# Patient Record
Sex: Male | Born: 1975
Health system: Southern US, Community
[De-identification: ages and names within clinical notes are randomized; demographics above are authoritative.]

## PROBLEM LIST (undated history)

## (undated) DIAGNOSIS — J45909 Unspecified asthma, uncomplicated: Secondary | ICD-10-CM

## (undated) DIAGNOSIS — I10 Essential (primary) hypertension: Secondary | ICD-10-CM

## (undated) DIAGNOSIS — I509 Heart failure, unspecified: Secondary | ICD-10-CM

## (undated) DIAGNOSIS — H3500 Unspecified background retinopathy: Secondary | ICD-10-CM

## (undated) DIAGNOSIS — F32A Depression, unspecified: Secondary | ICD-10-CM

## (undated) DIAGNOSIS — F319 Bipolar disorder, unspecified: Secondary | ICD-10-CM

## (undated) DIAGNOSIS — I219 Acute myocardial infarction, unspecified: Secondary | ICD-10-CM

## (undated) DIAGNOSIS — E785 Hyperlipidemia, unspecified: Secondary | ICD-10-CM

## (undated) DIAGNOSIS — B019 Varicella without complication: Secondary | ICD-10-CM

## (undated) DIAGNOSIS — F329 Major depressive disorder, single episode, unspecified: Secondary | ICD-10-CM

## (undated) DIAGNOSIS — D689 Coagulation defect, unspecified: Secondary | ICD-10-CM

## (undated) DIAGNOSIS — R011 Cardiac murmur, unspecified: Secondary | ICD-10-CM

## (undated) DIAGNOSIS — I639 Cerebral infarction, unspecified: Secondary | ICD-10-CM

## (undated) DIAGNOSIS — E119 Type 2 diabetes mellitus without complications: Secondary | ICD-10-CM

## (undated) DIAGNOSIS — R0602 Shortness of breath: Secondary | ICD-10-CM

## (undated) HISTORY — DX: Type 2 diabetes mellitus without complications: E11.9

## (undated) HISTORY — DX: Depression, unspecified: F32.A

## (undated) HISTORY — DX: Unspecified background retinopathy: H35.00

## (undated) HISTORY — DX: Unspecified asthma, uncomplicated: J45.909

## (undated) HISTORY — DX: Cerebral infarction, unspecified: I63.9

## (undated) HISTORY — DX: Hyperlipidemia, unspecified: E78.5

## (undated) HISTORY — DX: Coagulation defect, unspecified: D68.9

## (undated) HISTORY — DX: Cardiac murmur, unspecified: R01.1

## (undated) HISTORY — DX: Major depressive disorder, single episode, unspecified: F32.9

## (undated) HISTORY — DX: Essential (primary) hypertension: I10

## (undated) HISTORY — DX: Bipolar disorder, unspecified: F31.9

## (undated) HISTORY — DX: Acute myocardial infarction, unspecified: I21.9

## (undated) HISTORY — DX: Shortness of breath: R06.02

## (undated) HISTORY — DX: Heart failure, unspecified: I50.9

## (undated) HISTORY — PX: WISDOM TOOTH EXTRACTION: SHX21

## (undated) HISTORY — DX: Varicella without complication: B01.9

---

## 2001-06-01 DIAGNOSIS — E119 Type 2 diabetes mellitus without complications: Secondary | ICD-10-CM

## 2001-06-01 HISTORY — DX: Type 2 diabetes mellitus without complications: E11.9

## 2015-06-02 DIAGNOSIS — I219 Acute myocardial infarction, unspecified: Secondary | ICD-10-CM

## 2015-06-02 HISTORY — DX: Acute myocardial infarction, unspecified: I21.9

## 2015-06-02 HISTORY — PX: CORONARY ANGIOPLASTY WITH STENT PLACEMENT: SHX49

## 2016-01-31 DIAGNOSIS — H3500 Unspecified background retinopathy: Secondary | ICD-10-CM

## 2016-01-31 HISTORY — DX: Unspecified background retinopathy: H35.00

## 2016-06-01 DIAGNOSIS — I2699 Other pulmonary embolism without acute cor pulmonale: Secondary | ICD-10-CM

## 2016-06-01 HISTORY — DX: Other pulmonary embolism without acute cor pulmonale: I26.99

## 2016-08-13 DIAGNOSIS — E109 Type 1 diabetes mellitus without complications: Secondary | ICD-10-CM | POA: Diagnosis not present

## 2016-08-17 ENCOUNTER — Other Ambulatory Visit: Payer: Self-pay

## 2016-08-17 VITALS — BP 110/64 | HR 74 | Ht 72.0 in | Wt 253.3 lb

## 2016-08-17 DIAGNOSIS — R0602 Shortness of breath: Secondary | ICD-10-CM

## 2016-08-17 HISTORY — DX: Shortness of breath: R06.02

## 2016-08-17 NOTE — Patient Outreach (Signed)
[facility]) Care Management  Welcome  08/17/2016   Shante Maysonet 1975-08-25 [id number]  Subjective: I met with Jose Merritt today in the [facility] diabetes program.  We will support Jose Merritt, provide education, and encourage ongoing care with his team of providers.  Jose Merritt reports that his CGMS is currently not working so he has not been monitoring blood sugars for about 3 weeks.  The CGMS is under warranty and he hopes to have it back in a couple of weeks.  He reports counting carbs at meals and taking his insulin to cover it but not giving any correction insulin.  Jose Merritt was wearing his insulin pump today when he came to visit.   He did not have his meter because he has not been checking.   He is taking all his oral medications as ordered.  He ran out of [place] and his psychiatrist will not reorder it until he visits with her.     Jose Merritt denies any pain.  He reports increased SOB, but no leg swelling .  He does not weigh daily.  He was at the cardiologist's in January- his O2 sats were normal at that time.   He complains of depression (several days a week).  He does not exercise.  He and his family eat out 6 times a week. He eats very little during the day then over eats at night.  Today he had only eaten a Poptart before our 1 pm meeting. He is the heaviest weight he's ever been in his life.   Objective:  Vitals:   08/17/16 1301  BP: 110/64  Pulse: 74  SpO2: 97%  Weight: 253 lb 4.8 oz (114.9 kg)  Height: 1.829 m (6')     Encounter Medications:  Outpatient Encounter Prescriptions as of 08/17/2016  Medication Sig  . aspirin 81 MG chewable tablet Chew by mouth daily.  Marland Kitchen atorvastatin (LIPITOR) 80 MG tablet Take 80 mg by mouth daily. qam  . clopidogrel (PLAVIX) 75 MG tablet Take 75 mg by mouth daily. qam  . DULoxetine (CYMBALTA) 60 MG capsule Take 60 mg by mouth daily. qam  . insulin lispro (HUMALOG) 100 UNIT/ML injection Inject into the skin  continuous. Medtronic 670G basal rate 3.6 units/hour, I:C ratio 3.5gm/1 unit, goal 100-120 correction factor 1 units=17 points.  Also uses CGMS.  Continuous pump user since October 2017   . lisinopril (PRINIVIL,ZESTRIL) 5 MG tablet Take 5 mg by mouth daily. qam  . Lurasidone HCl (LATUDA PO) Take by mouth daily. Patient does not have a current order for this medication.   . metoprolol succinate (TOPROL-XL) 100 MG 24 hr tablet Take 100 mg by mouth daily. Take with or immediately following a meal. qam  . spironolactone (ALDACTONE) 25 MG tablet Take 12.5 mg by mouth daily. qam   No facility-administered encounter medications on file as of 08/17/2016.     Functional Status:  In your present state of health, do you have any difficulty performing the following activities: 08/17/2016  Hearing? N  Vision? N  Difficulty concentrating or making decisions? N  Walking or climbing stairs? N  Dressing or bathing? N  Doing errands, shopping? N    Fall/Depression Screening: PHQ 2/9 Scores 08/17/2016  PHQ - 2 Score 2  PHQ- 9 Score 5    Assessment: Jose Merritt is poorly engaged in his care.  He is also very negative about the possibility of being able to manage his health over a long period of  time- he reports he does well for a while, and although he feels better when he is managing his disease, he never seems to sustain it.   Plan:  [facility] CM Care Plan Problem One     Most Recent Value  Care Plan Problem One  Elevated A1C   Role Documenting the Problem One  Care Management Coordinator  Care Plan for Problem One  Active  [facility] Long Term Goal (31-90 days)  Patient will lower A1C to lower than 9.3% when it is checked in 3 months  [facility] Long Term Goal Start Date  08/17/16  Interventions for Problem One Long Term Goal  1. get f/u appointment with psychiatrist to get Latuda  2. Exercise 3x/week for 15 minutes  3.  Calculate carbs, fat and sodium at one meal this week- send it to me   4. follow up on CGMS but check blood  sugars before meals while you do not have the CGMS    [facility] CM Care Plan Problem Two     Most Recent Value  Care Plan Problem Two  Patient does not carry low blood sugar treatment with him - stating- I never have low blood sugars  Role Documenting the Problem Two  Care Management [place] for Problem Two  Active  [facility] Long Term Goal (31-90) days  Patient is not prepared to treat a low blood sugar (does not carry supplies with him)  [facility] Long Term Goal Start Date  08/17/16     I will follow up with Jose Merritt on April 9th.  I have encouraged him to see you before then and to check his blood sugars before each meal.  He needs to schedule a follow up appointment for a dilated eye (in September) as he will be using a new doctor.    He needs to get to the psychiatrist to get his Latuda restarted.    Gentry Fitz, RN, BA, [place], [place] Direct Dial:  [phone]  Fax:  [phone] E-mail: [email or web address] [address], [place],   [address]

## 2016-08-25 ENCOUNTER — Other Ambulatory Visit: Payer: Self-pay

## 2016-08-25 NOTE — Patient Outreach (Deleted)
[facility]) Care Management  08/25/2016  Danuel Burkes 08/12/1975 [id number]   E-mail sent to patient's email; [email or web address] checking on patient's progress.  Have not received 1 meal assessment of carbohydrates, fat, sodium as requested at visit last week.   [name], RN, BA, MHA, CDE [facility] Diabetes Coordinator- [name] Dial:  [phone]  Fax:  [phone] E-mail: [name] [address], [place], [place]  [address]

## 2016-08-25 NOTE — Patient Outreach (Signed)
[facility]) Care Management  08/25/2016  Tiam Neller 1975-10-17 [id number]   E-mail sent to patient Wendall Stade requesting 1 meal assessment of sodium, carbohydrates and fats from last week- have not received it as requested last week at our initial appointment.   [name], RN, BA, MHA, CDE [facility] Diabetes Coordinator- [name] Dial:  [phone]  Fax:  [phone] E-mail: [name] [address], [place], [place]  [address]

## 2016-08-26 ENCOUNTER — Other Ambulatory Visit: Payer: Self-pay

## 2016-08-26 NOTE — Patient Outreach (Signed)
[facility]) Care Management  08/26/2016  Jose Merritt [birth year] [id number]   Reached out to patient today as a follow up to last week's initial appointment.  Interested in finding out; 1. Did he call family doctor to get [name] ordered? 2. Has he been checking blood sugars- what are they? 3. Did his CGMS arrive (it was being repaired under warranty)? 4. Has he had SOB or leg swelling? 5. Has he been able to exercise 3x/week for 15 minutes? 6. Is he prepared for a low blood sugar episode by keeping glucose and a snack with him at all times?   [name], RN, BA, MHA, CDE [facility] Diabetes Coordinator- [name] Dial:  [phone]  Fax:  [phone] E-mail: [name] [address] [place], [place]  [address]

## 2016-09-07 ENCOUNTER — Other Ambulatory Visit: Payer: Self-pay

## 2016-09-07 DIAGNOSIS — Z794 Long term (current) use of insulin: Principal | ICD-10-CM

## 2016-09-07 DIAGNOSIS — E1165 Type 2 diabetes mellitus with hyperglycemia: Secondary | ICD-10-CM

## 2016-09-07 DIAGNOSIS — IMO0002 Reserved for concepts with insufficient information to code with codable children: Secondary | ICD-10-CM

## 2016-09-07 DIAGNOSIS — E118 Type 2 diabetes mellitus with unspecified complications: Principal | ICD-10-CM

## 2016-09-07 NOTE — Patient Outreach (Signed)
[facility]) Care Management  09/07/2016  Jose Merritt 07/22/1975 [id number]  Received an e-mail this morning from [facility], stating;   " woke up today with severe chest pains. I'm not taking my daughter to school and I'm staying here. Can we please reschedule? I apologize for this late notice but I really don't feel well. Please let me know ASAP. " [name]  I responded by e-mail stating he needed to go to the emergency room for chest pain asap.    His response was; Thanks for understanding. The pain is much better now. I'm just trying to stay calm. I get scared of another heart attack when this happens. Please let me know when I can come back to see you.   [name], RN, BA, MHA, CDE [facility] Diabetes Coordinator- [name] Direct Dial:  [phone]  Fax:  [phone] E-mail: [name] [address], [place], [place]  [address]

## 2016-09-07 NOTE — Patient Outreach (Signed)
[facility]) Care Management  09/07/2016  Carlie Franchina [birth year] [id number]   Spoke to patient by phone.  He stated "it (chest pain) is nearly gone".  I asked him if he had Nitroglycerine and if he had taken any (yes he has some/no he didn't take any).  Tells me the chest pain started after his wife left for work.  He "thought it might be indigestion".  I have instructed him that he needs to presume it is always cardiac- with his medical history and elevated A1C.    His daughter is home with him at this time- I have instructed to always go to the hospital with chest pain, that he needs to call his wife (that she could come home).  When I enquired about him getting his medication from his psychiatrist (he needed to make an appointment with her in order to get a refill).  He tells me he did not make an appointment with her and that he would call and do it tomorrow.  When I asked why he'd wait until tomorrow and not do it today, he said "I could".   I will follow up later this week.   [name], RN, BA, MHA, CDE [facility] Diabetes Coordinator- [name] Dial:  [phone]  Fax:  [phone] E-mail: [name] [address] [place], [place]  [address]

## 2016-09-07 NOTE — Patient Outreach (Signed)
[facility]) Care Management  08/27/16 Jose Merritt [birth year] [id number]   Abem emailed me on 3/39/18;   "I have completed my POC paperwork and the [facility] education. Last week I exercised 4 times. 3 15 min like instructed and quite a long walk this past Monday. My wife got my glucose tablets for the vehicles. I am going to attempt to make the required appointment while I'm here in [place].  '  2nd e-mail;  my eye appointment is May24 1:40pm   [name], RN, BA, MHA, CDE [facility] Diabetes Coordinator- [name] Direct Dial:  [phone]  Fax:  [phone] E-mail: [name] [address], [place], [place]  [address]

## 2016-09-08 ENCOUNTER — Other Ambulatory Visit: Payer: Self-pay

## 2016-09-08 DIAGNOSIS — E118 Type 2 diabetes mellitus with unspecified complications: Principal | ICD-10-CM

## 2016-09-08 DIAGNOSIS — IMO0002 Reserved for concepts with insufficient information to code with codable children: Secondary | ICD-10-CM

## 2016-09-08 DIAGNOSIS — E1165 Type 2 diabetes mellitus with hyperglycemia: Secondary | ICD-10-CM

## 2016-09-08 DIAGNOSIS — Z794 Long term (current) use of insulin: Principal | ICD-10-CM

## 2016-09-08 NOTE — Patient Outreach (Signed)
[facility]) Care Management  09/08/2016  Paige Lacour 1975/07/30 [id number]   Thunder has notified me that he has scheduled an appointment  On April 13th 10:30AM at the [facility] with [facility].   [name], RN, BA, MHA, CDE [facility] Diabetes Coordinator- [name] Dial:  [phone]  Fax:  [phone] E-mail: [name] [address], [place], [place]  [address]

## 2016-09-08 NOTE — Patient Outreach (Signed)
[facility]) Care Management  09/08/2016  Jose Merritt 07/29/1975 [id number]   Spoke to patient by phone today.  He is not having chest pain today.  He had no chest pain last night and went for a walk with his child and wife. He has been walking 4x/week.  He told his wife about the chest pain episode and his wife agreed with me that he should take his Nitroglycerine and go the emergency room.   Patient complained that ED visits were expensive and I told him that although they are expensive, having a stroke or being debilitated were going to "cost him" as well. We agreed that in future, he would follow his MD instructions to 1. Take nitro  2. call EMS if pain is not relieved in a few minutes.  I will follow up with him next week.   [name], RN, BA, MHA, CDE [facility] Diabetes Coordinator- [name] Dial:  [phone]  Fax:  [phone] E-mail: [name] [address] [place], [place]  [address]

## 2016-09-16 ENCOUNTER — Other Ambulatory Visit: Payer: Self-pay

## 2016-09-16 VITALS — BP 96/74 | HR 86 | Ht 72.0 in | Wt 293.3 lb

## 2016-09-16 DIAGNOSIS — E1165 Type 2 diabetes mellitus with hyperglycemia: Secondary | ICD-10-CM

## 2016-09-16 DIAGNOSIS — E118 Type 2 diabetes mellitus with unspecified complications: Principal | ICD-10-CM

## 2016-09-16 DIAGNOSIS — Z794 Long term (current) use of insulin: Principal | ICD-10-CM

## 2016-09-16 DIAGNOSIS — IMO0002 Reserved for concepts with insufficient information to code with codable children: Secondary | ICD-10-CM

## 2016-09-16 LAB — POCT GLYCOSYLATED HEMOGLOBIN (HGB A1C): HEMOGLOBIN A1C: 11.2

## 2016-09-16 NOTE — Patient Outreach (Signed)
[facility]) Care Management  [facility] Care Manager  09/16/2016   Jose Merritt 03/11/1976 [id number]  Subjective: Keitaro was here for his follow up [name] diabetes visit.  He has done [employer] well completing some of the tasks we addressed at our first visit; exercising 3x/week for 15 minutes, scheduling an endocrinology visit (July), scheduled a dental visit and a psychiatry visit. He is keeping glucose tablets in his car.    He experienced chest pain a week ago and tells me he has it about once every 2 weeks- but he is not using Nitro glycerine as instructed.  Complains of increased WOB  And neuropathy -pain 5/10.  He is NOT checking blood sugars regularly. He has NOT gotten his Continuous Glucose Monitor functioning properly.   He still does not have his Latuda but has scheduled to see MD on 09/30/16. He is taking his other oral medications but does not always bolus at meals.   Objective:  Vitals:   09/16/16 1025  BP: 96/74  Pulse: 86  SpO2: 97%  Weight: 293 lb 4.8 oz (133 kg)  Height: 1.829 m (6')   POCT A1C 11.2%  Encounter Medications:  Outpatient Encounter Prescriptions as of 09/16/2016  Medication Sig  . aspirin 81 MG chewable tablet Chew by mouth daily.  Marland Kitchen atorvastatin (LIPITOR) 80 MG tablet Take 80 mg by mouth daily. qam  . clopidogrel (PLAVIX) 75 MG tablet Take 75 mg by mouth daily. qam  . DULoxetine (CYMBALTA) 60 MG capsule Take 60 mg by mouth daily. qam  . insulin lispro (HUMALOG) 100 UNIT/ML injection Inject into the skin continuous. Medtronic 670G basal rate 3.6 units/hour, I:C ratio 3.5gm/1 unit, goal 100-120 correction factor 1 units=17 points.  Also uses CGMS.  Continuous pump user since October 2017   . lisinopril (PRINIVIL,ZESTRIL) 5 MG tablet Take 5 mg by mouth daily. qam  . metoprolol succinate (TOPROL-XL) 100 MG 24 hr tablet Take 100 mg by mouth daily. Take with or immediately following a meal. qam  . nitroGLYCERIN (NITROSTAT)  0.3 MG SL tablet Place 0.3 mg under the tongue every 5 (five) minutes as needed.  Marland Kitchen spironolactone (ALDACTONE) 25 MG tablet Take 12.5 mg by mouth daily. qam  . Lurasidone HCl (LATUDA PO) Take by mouth daily. Patient does not have a current order for this medication.    No facility-administered encounter medications on file as of 09/16/2016.     Functional Status:  In your present state of health, do you have any difficulty performing the following activities: 08/17/2016  Hearing? N  Vision? N  Difficulty concentrating or making decisions? N  Walking or climbing stairs? N  Dressing or bathing? N  Doing errands, shopping? N    Fall/Depression Screening: PHQ 2/9 Scores 08/17/2016  PHQ - 2 Score 2  PHQ- 9 Score 5    Assessment: Jovante is trying to improve his engagement in his care.  Since our last visit, he has "made better choices" with eating and has begun exercising.  He has scheduled all his visits but continues to be slack in checking blood sugars.  He and his wife have chosen to begin a weight loss program. We discussed weight watchers, tracking apps for food such as Lose It, the neccessity to eat 3 meals per day, the suggestion to fit in more low carbohydrate vegetables and the importance of looking a food calories/sodium and carbohydrates before he goes out to eat.  I have asked him to;  -Check blood sugars  a minimum of once a day-write them down- bring them with him to next visit.  -Execise 3x/week for 15 minutes- keep his glucose tablets with him when he walks -Call the 1-800 for his Continuous glucose monitor -Use Nitro as needed -Be "present" when he eats his 3 meals per day  I gave him a new lancing device since his was missing a cap.   Follow up in 3 weeks   [name], RN, [place], [place], CDE [facility] Diabetes Coordinator- [name] Dial:  [phone]  Fax:  [phone] E-mail: [name] [address],  [place], [place]  [address]

## 2016-09-22 ENCOUNTER — Other Ambulatory Visit: Payer: Self-pay

## 2016-09-22 ENCOUNTER — Ambulatory Visit: Payer: Self-pay

## 2016-09-22 NOTE — Patient Outreach (Signed)
[facility]) Care Management  09/22/2016  Cornellius Limpert 05-14-1976 [id number]   Left message with Markhi as a follow up to our visit last week.  I had asked him to;   -Check blood sugars a minimum of once a day-write them down- bring them with him to next visit.  -Execise 3x/week for 15 minutes- keep his glucose tablets with him when he walks -Call the 1-800 for his Continuous glucose monitor -Use Nitro as needed -Be "present" when he eats his 3 meals per day  Asked him to call and follow up with me.   [name], RN, BA, MHA, CDE [facility] Diabetes Coordinator- [name] Dial:  [phone]  Fax:  [phone] E-mail: [name] [address], [place], [place]  [address]

## 2016-09-23 DIAGNOSIS — E109 Type 1 diabetes mellitus without complications: Secondary | ICD-10-CM | POA: Diagnosis not present

## 2016-09-29 ENCOUNTER — Encounter: Payer: Self-pay | Admitting: Family

## 2016-09-29 ENCOUNTER — Ambulatory Visit (INDEPENDENT_AMBULATORY_CARE_PROVIDER_SITE_OTHER): Payer: 59 | Admitting: Family

## 2016-09-29 VITALS — BP 98/78 | HR 88 | Temp 97.9°F | Ht 72.0 in | Wt 250.0 lb

## 2016-09-29 DIAGNOSIS — I252 Old myocardial infarction: Secondary | ICD-10-CM

## 2016-09-29 DIAGNOSIS — Z8673 Personal history of transient ischemic attack (TIA), and cerebral infarction without residual deficits: Secondary | ICD-10-CM

## 2016-09-29 DIAGNOSIS — F3170 Bipolar disorder, currently in remission, most recent episode unspecified: Secondary | ICD-10-CM | POA: Diagnosis not present

## 2016-09-29 DIAGNOSIS — I10 Essential (primary) hypertension: Secondary | ICD-10-CM

## 2016-09-29 DIAGNOSIS — J0100 Acute maxillary sinusitis, unspecified: Secondary | ICD-10-CM

## 2016-09-29 DIAGNOSIS — E785 Hyperlipidemia, unspecified: Secondary | ICD-10-CM | POA: Diagnosis not present

## 2016-09-29 DIAGNOSIS — E119 Type 2 diabetes mellitus without complications: Secondary | ICD-10-CM | POA: Diagnosis not present

## 2016-09-29 MED ORDER — AMOXICILLIN-POT CLAVULANATE ER 1000-62.5 MG PO TB12: 2.0000 | ORAL_TABLET | Freq: Two times a day (BID) | ORAL | 0 refills | Status: DC

## 2016-09-29 NOTE — Progress Notes (Signed)
Subjective:    Patient ID: Jose Merritt, male    DOB: 03/16/1976, 41 y.o.   MRN: [id number]  CC: Jose Merritt is a 41 y.o. male who presents today for an acute visit.    HPI: CC: infection on nose and eyes x 2 weeks, waxing and wanes, better today. 'if press on nose can feel tenderness.' Sore to the touch. Initally on left side of nose. Now seems more tender right side of nose.  No teeth pain or trouble eating. No sinus drainage/discharge, cough. Tried tyleonol with some relief.     DM II- On insulin pump. Would like endocrinologist in [place]  Bipolar- stable on latuda. Follows with Dr. Arneta Cliche in [place] for psychiatry  New patient - from [facility]; our office is closer . Awaiting records.   Non smoker    HISTORY:  Past Medical History:  Diagnosis Date  . Asthma   . Chicken pox   . Depression   . Diabetes mellitus without complication (HCC) 2003   Type 2  . Hyperlipidemia   . Hypertension   . Myocardial infarction (HCC) 2017  . Retinopathy of both eyes 01/2016  . SOB (shortness of breath) 08/17/2016  . Stroke [facility])    Past Surgical History:  Procedure Laterality Date  . CORONARY ANGIOPLASTY WITH STENT PLACEMENT Left 06/2015   LAD  . WISDOM TOOTH EXTRACTION     Family History  Problem Relation Age of Onset  . Hypertension Mother   . Arthritis Mother   . Heart disease Father   . Diabetes Father   . Hypertension Father   . Hyperlipidemia Father   . Alcohol abuse Father   . Hypertension Brother   . Hyperlipidemia Brother   . Heart disease Maternal Uncle   . Heart disease Paternal Aunt   . Heart disease Maternal Grandfather   . Hyperlipidemia Maternal Grandfather   . Diabetes Maternal Grandfather   . Kidney disease Maternal Grandfather   . Heart disease Paternal Grandmother   . Kidney disease Paternal Grandmother   . Hypertension Maternal Grandmother   . Kidney disease Maternal Grandmother   . Arthritis Maternal Grandmother   . Kidney  disease Paternal Grandfather     Allergies: Other; Sulfa antibiotics; and Contrast media [iodinated diagnostic agents] Current Outpatient Prescriptions on File Prior to Visit  Medication Sig Dispense Refill  . aspirin 81 MG chewable tablet Chew by mouth daily.    Marland Kitchen atorvastatin (LIPITOR) 80 MG tablet Take 80 mg by mouth daily. qam    . clopidogrel (PLAVIX) 75 MG tablet Take 75 mg by mouth daily. qam    . DULoxetine (CYMBALTA) 60 MG capsule Take 60 mg by mouth daily. qam    . insulin lispro (HUMALOG) 100 UNIT/ML injection Inject into the skin continuous. Medtronic 670G basal rate 3.6 units/hour, I:C ratio 3.5gm/1 unit, goal 100-120 correction factor 1 units=17 points.  Also uses CGMS.  Continuous pump user since October 2017     . lisinopril (PRINIVIL,ZESTRIL) 5 MG tablet Take 5 mg by mouth daily. qam    . Lurasidone HCl (LATUDA PO) Take by mouth daily. Patient does not have a current order for this medication.     . metoprolol succinate (TOPROL-XL) 100 MG 24 hr tablet Take 100 mg by mouth daily. Take with or immediately following a meal. qam    . nitroGLYCERIN (NITROSTAT) 0.3 MG SL tablet Place 0.3 mg under the tongue every 5 (five) minutes as needed.    Marland Kitchen spironolactone (ALDACTONE) 25  MG tablet Take 12.5 mg by mouth daily. qam     No current facility-administered medications on file prior to visit.     Social History  Substance Use Topics  . Smoking status: Never Smoker  . Smokeless tobacco: Never Used  . Alcohol use Yes     Comment: rare- 2x/year    Review of Systems  Constitutional: Negative for chills and fever.  HENT: Positive for sinus pressure. Negative for congestion and sore throat.   Respiratory: Negative for cough, shortness of breath and wheezing.   Cardiovascular: Negative for chest pain and palpitations.  Gastrointestinal: Negative for nausea and vomiting.      Objective:    BP 98/78   Pulse 88   Temp 97.9 F (36.6 C) (Oral)   Ht 6' (1.829 m)   Wt 250 lb  (113.4 kg)   SpO2 97%   BMI 33.91 kg/m    Physical Exam  Constitutional: Vital signs are normal. He appears well-developed and well-nourished.  HENT:  Head: Normocephalic and atraumatic.  Right Ear: Hearing, tympanic membrane, external ear and ear canal normal. No drainage, swelling or tenderness. Tympanic membrane is not injected, not erythematous and not bulging. No middle ear effusion. No decreased hearing is noted.  Left Ear: Hearing, tympanic membrane, external ear and ear canal normal. No drainage, swelling or tenderness. Tympanic membrane is not injected, not erythematous and not bulging.  No middle ear effusion. No decreased hearing is noted.  Nose: Nose normal. Right sinus exhibits no maxillary sinus tenderness and no frontal sinus tenderness. Left sinus exhibits no maxillary sinus tenderness and no frontal sinus tenderness.  Mouth/Throat: Uvula is midline, oropharynx is clear and moist and mucous membranes are normal. No oral lesions. No trismus in the jaw. No dental abscesses, uvula swelling or dental caries. No oropharyngeal exudate, posterior oropharyngeal edema, posterior oropharyngeal erythema or tonsillar abscesses.  Missing teeth. No chipped teeth. No tenderness appreciated with palpation of gum, dentition.   Eyes: Conjunctivae are normal.  Cardiovascular: Regular rhythm and normal heart sounds.   Pulmonary/Chest: Effort normal and breath sounds normal. No respiratory distress. He has no wheezes. He has no rhonchi. He has no rales.  Lymphadenopathy:       Head (right side): No submental, no submandibular, no tonsillar, no preauricular, no posterior auricular and no occipital adenopathy present.       Head (left side): No submental, no submandibular, no tonsillar, no preauricular, no posterior auricular and no occipital adenopathy present.    He has no cervical adenopathy.  Neurological: He is alert.  Skin: Skin is warm and dry.  Psychiatric: He has a normal mood and affect.  His speech is normal and behavior is normal.  Vitals reviewed.      Assessment & Plan:   Problem List Items Addressed This Visit      Cardiovascular and Mediastinum   HTN (hypertension)    Well-controlled. Continue current regimen.        Respiratory   Acute non-recurrent maxillary sinusitis - Primary    Atypical presentation. Reassured by benign oral exam no evidence of dental caries, abscess. Based on duration of symptoms of 2 weeks and what appears to be sinus tenderness, patient I jointly agreed reasonable to treat her sinus infection and start broad-spectrum antibiotic. Close follow-up and patient will let me know how he is doing.      Relevant Medications   amoxicillin-clavulanate (AUGMENTIN XR) 1000-62.5 MG 12 hr tablet     Endocrine   Diabetes mellitus  without complication (HCC)    Type II diabetic, currently on insulin pump;  referral placed for local endocrinologist.      Relevant Orders   Ambulatory referral to Endocrinology     Other   Bipolar disorder in partial remission (HCC)    Stable on latuda. Referral placed for local psychiatrist.      Relevant Orders   Ambulatory referral to Psychiatry   HLD (hyperlipidemia)   History of CVA (cerebrovascular accident)   History of MI (myocardial infarction)         I am having Mr. Seib start on amoxicillin-clavulanate. I am also having him maintain his metoprolol succinate, clopidogrel, lisinopril, atorvastatin, DULoxetine, spironolactone, insulin lispro, Lurasidone HCl (LATUDA PO), aspirin, and nitroGLYCERIN.   Meds ordered this encounter  Medications  . amoxicillin-clavulanate (AUGMENTIN XR) 1000-62.5 MG 12 hr tablet    Sig: Take 2 tablets by mouth 2 (two) times daily.    Dispense:  14 tablet    Refill:  0    Order Specific Question:   Supervising Provider    Answer:   Sherlene Shams [[id number]]    Return precautions given.   Risks, benefits, and alternatives of the medications and treatment plan  prescribed today were discussed, and patient expressed understanding.   Education regarding symptom management and diagnosis given to patient on AVS.  Continue to follow with [name], FNP for routine health maintenance.   Dixie Dials and I agreed with plan.   [name], FNP

## 2016-09-29 NOTE — Patient Instructions (Signed)
Trial antibiotic   Please stay very vigilant and let me know how you are doing  Pleasure meeting you

## 2016-09-29 NOTE — Progress Notes (Signed)
Pre visit review using our clinic review tool, if applicable. No additional management support is needed unless otherwise documented below in the visit note. 

## 2016-09-30 ENCOUNTER — Encounter: Payer: Self-pay | Admitting: Family

## 2016-09-30 DIAGNOSIS — E782 Mixed hyperlipidemia: Secondary | ICD-10-CM | POA: Insufficient documentation

## 2016-09-30 DIAGNOSIS — I252 Old myocardial infarction: Secondary | ICD-10-CM | POA: Insufficient documentation

## 2016-09-30 DIAGNOSIS — F3175 Bipolar disorder, in partial remission, most recent episode depressed: Secondary | ICD-10-CM | POA: Insufficient documentation

## 2016-09-30 DIAGNOSIS — F319 Bipolar disorder, unspecified: Secondary | ICD-10-CM | POA: Insufficient documentation

## 2016-09-30 DIAGNOSIS — J0100 Acute maxillary sinusitis, unspecified: Secondary | ICD-10-CM | POA: Insufficient documentation

## 2016-09-30 DIAGNOSIS — Z8673 Personal history of transient ischemic attack (TIA), and cerebral infarction without residual deficits: Secondary | ICD-10-CM | POA: Insufficient documentation

## 2016-09-30 DIAGNOSIS — I10 Essential (primary) hypertension: Secondary | ICD-10-CM | POA: Insufficient documentation

## 2016-09-30 NOTE — Assessment & Plan Note (Signed)
Atypical presentation. Reassured by benign oral exam no evidence of dental caries, abscess. Based on duration of symptoms of 2 weeks and what appears to be sinus tenderness, patient I jointly agreed reasonable to treat her sinus infection and start broad-spectrum antibiotic. Close follow-up and patient will let me know how he is doing.

## 2016-09-30 NOTE — Assessment & Plan Note (Signed)
Type II diabetic, currently on insulin pump;  referral placed for local endocrinologist.

## 2016-09-30 NOTE — Progress Notes (Signed)
Patient is experiencing no change in SX.  "Not better and not worse just the same".

## 2016-09-30 NOTE — Assessment & Plan Note (Signed)
Well-controlled.  Continue current regimen. 

## 2016-09-30 NOTE — Assessment & Plan Note (Signed)
Stable on latuda. Referral placed for local psychiatrist.

## 2016-10-01 NOTE — Progress Notes (Signed)
Noted  

## 2016-10-05 ENCOUNTER — Other Ambulatory Visit: Payer: Self-pay

## 2016-10-05 ENCOUNTER — Telehealth: Payer: Self-pay | Admitting: Family

## 2016-10-05 VITALS — BP 110/74 | HR 81

## 2016-10-05 DIAGNOSIS — E1165 Type 2 diabetes mellitus with hyperglycemia: Secondary | ICD-10-CM

## 2016-10-05 DIAGNOSIS — E118 Type 2 diabetes mellitus with unspecified complications: Principal | ICD-10-CM

## 2016-10-05 DIAGNOSIS — F3170 Bipolar disorder, currently in remission, most recent episode unspecified: Secondary | ICD-10-CM

## 2016-10-05 DIAGNOSIS — IMO0002 Reserved for concepts with insufficient information to code with codable children: Secondary | ICD-10-CM

## 2016-10-05 DIAGNOSIS — J0100 Acute maxillary sinusitis, unspecified: Secondary | ICD-10-CM

## 2016-10-05 DIAGNOSIS — Z794 Long term (current) use of insulin: Principal | ICD-10-CM

## 2016-10-05 MED ORDER — LURASIDONE HCL 40 MG PO TABS: 40.0000 mg | ORAL_TABLET | Freq: Two times a day (BID) | ORAL | 1 refills | Status: DC

## 2016-10-05 MED ORDER — AMOXICILLIN-POT CLAVULANATE 875-125 MG PO TABS: 1.0000 | ORAL_TABLET | Freq: Two times a day (BID) | ORAL | 0 refills | Status: DC

## 2016-10-05 NOTE — Telephone Encounter (Signed)
Patient ask if you would refill Latuda ? Patient stated he is completely out of this medication, prescribed by another provide patient requesting PCP refill? Patient was advised of continued ABX and to take probiotic duing and for 2 weeks after completing treatment.

## 2016-10-05 NOTE — Telephone Encounter (Signed)
Call pt-    As long as no worsening features ( fever, HA, vision changes, increased pain), Will extend abx another 7 days since he has had improvement on augmentin.   Also placed referral to ENT for further evaluation  Ensure to take probiotics while on antibiotics and also for 2 weeks after completion. It is important to re-colonize the gut with good bacteria and also to prevent any diarrheal infections associated with antibiotic use.

## 2016-10-05 NOTE — Telephone Encounter (Signed)
Pt called and stated that he saw M. Arnett on 5/1 with face pain. Pt states that she prescribed him Augmentin and the face pain has subsided a little but not all the way. He is just about out of medication, he has 1 day left. , thank you. Spoke with patient no fever or sinus congestion.   Patient complains of pains of pain at bottom of nose , no pain in sinus area . Please advise  Call pt@ [phone]

## 2016-10-05 NOTE — Patient Outreach (Signed)
[facility]) Care Management  [facility] Care Manager  10/05/2016   Jose Merritt 12/18/1975 [id number]  Subjective:  Jose Merritt did not show for his scheduled visit- I called him at home to inquire and he said he didn't know he had a visit- was able to come over immediately.    Reports he is wearing his insulin pump and has CGMS now (was not working) but is not wearing it. He did not bring his meter with him but reports he is checking 1-3 times a day with blood sugars 220-250mg /dl.  Reports he is taking about 20 units of insulin at each meal, but generally only eats 2 meals per day- based on the "average daily dose" of insulin delivered by the pump, it looks like on average he is taking 40 units total a day for meals.  He reports he often skips breakfast.  Tells me he's walking once a day with his dog or daughter, 10 minutes- denies SOB, edema or chest pain.  Did not go to see his psychiatrist- is NOT taking Latuda because he ran out and needs to get a prescription.   Objective:  Vitals:   10/05/16 1045  BP: 110/74  Pulse: 81  SpO2: 97%     Encounter Medications:  Outpatient Encounter Prescriptions as of 10/05/2016  Medication Sig  . amoxicillin-clavulanate (AUGMENTIN XR) 1000-62.5 MG 12 hr tablet Take 2 tablets by mouth 2 (two) times daily.  Marland Kitchen aspirin 81 MG chewable tablet Chew by mouth daily.  Marland Kitchen atorvastatin (LIPITOR) 80 MG tablet Take 80 mg by mouth daily. qam  . clopidogrel (PLAVIX) 75 MG tablet Take 75 mg by mouth daily. qam  . DULoxetine (CYMBALTA) 60 MG capsule Take 60 mg by mouth daily. qam  . lisinopril (PRINIVIL,ZESTRIL) 5 MG tablet Take 5 mg by mouth daily. qam  . metoprolol succinate (TOPROL-XL) 100 MG 24 hr tablet Take 100 mg by mouth daily. Take with or immediately following a meal. qam  . nitroGLYCERIN (NITROSTAT) 0.3 MG SL tablet Place 0.3 mg under the tongue every 5 (five) minutes as needed.  Marland Kitchen spironolactone (ALDACTONE) 25 MG tablet Take 12.5 mg by mouth  daily. qam  . insulin lispro (HUMALOG) 100 UNIT/ML injection Inject into the skin continuous. Medtronic 670G basal rate 3.6 units/hour, I:C ratio 3.5gm/1 unit, goal 10pm-9am= 150mg /dl, 9am-10pm=100mg /dl,  correction factor 1 [id number]$$[id number]$ points.  Also uses CGMS.  Continuous pump user since October 2017  . Lurasidone HCl (LATUDA PO) Take by mouth daily. Patient does not have a current order for this medication.    No facility-administered encounter medications on file as of 10/05/2016.     Functional Status:  In your present state of health, do you have any difficulty performing the following activities: 08/17/2016  Hearing? N  Vision? N  Difficulty concentrating or making decisions? N  Walking or climbing stairs? N  Dressing or bathing? N  Doing errands, shopping? N    Fall/Depression Screening: Fall Risk  09/16/2016 08/17/2016  Falls in the past year? No No   PHQ 2/9 Scores 08/17/2016  PHQ - 2 Score 2  PHQ- 9 Score 5    Assessment: Success is poorly engaged although he has made some great progress since our first visit- his weight is down 9lbs since I saw him in March. He is checking blood sugars most days, has seen a family practitioner and has been scheduled with endocrinology, has a working CGMS and is walking most days of the week.   He has  NOT been taking Latuda for at least 2 months!  Plan: Follow up scheduled for 3 weeks- to call M.Arnett today to report ongoing face pain despite antibiotics ordered last week and reports he's run out of  Latuda.  Schedule the endo appointment- they left a voice message.  Put CGMS on today- I will follow up to confirm he has done it.   [facility] CM Care Plan Problem One     Most Recent Value  Care Plan Problem One  Elevated A1C   Care Plan for Problem One  Active  [facility] Long Term Goal (31-90 days)  Patient will lower A1C to lower than 9.3% when it is checked in 3 months  [facility] Long Term Goal Start Date  10/05/16  Interventions for Problem One Long Term  Goal  1. get f/u appointment with psychiatrist to get Latuda  2. Exercise 3x/week for 15 minutes  3.  Call Dr. Jason Coop re: face pain and NO LATUDA   4. put CGMS on today5. call endo to get an appointment    [facility] CM Care Plan Problem Two     Most Recent Value  Care Plan Problem Two  -- [has glucose tablets in his car- needs to keep them with him when he walks]      [name], RN, BA, MHA, CDE [facility] Diabetes Coordinator- [name] Dial:  [phone]  Fax:  [phone] E-mail: [name] [address], [place], [place]  [address]

## 2016-10-05 NOTE — Telephone Encounter (Signed)
Patient no longer seeing psychiatrist at [facility] he said can you make him a referral to another psychiatrist.

## 2016-10-05 NOTE — Patient Outreach (Signed)
[facility]) Care Management  10/05/2016  Mart Marecki 01/31/1976 [id number]  Received an e-mail from [name] on May 3rd.,   hey Jose Merritt,      I wanted to let you know that I have not done well since i left our last appointment.      I have only been doing my of walking once a week and my diet has been terrible. I have tested my sugars more and they are usually around 250-275. i feel like im completely failing at this once again. I did establish a new doc here in [place] and went to my first appointment on monday. NP Arnette with [facility].  Also I recieved the new sensors yesterday from [employer]. I cancelled my appointment for today in [place] with the head doc, i just cant stand her. I am willing to find someone locally. Also [facility] is referring me to a endocronologist here in [place] this week.      i just wanted to update you, not looking for sympathy just was inspired by the fact you mailed me that list. thanks for that.    My e-mail response;   You are too hard on yourself; let's look at the positive and where you've come from  1. Walking once a week (up from none) 2. Testing sugars (wasn't doing any) 3. Established new MD (didn't have one) 4. New sensor obtained (didn't have a working device) 5. Referral to [facility] (didn't have one)   So as I see it, we're making progress.  Keep plugging along!  Together we're on a journey. Thanks for the update.   [name], RN, BA, MHA, CDE [facility] Diabetes Coordinator- [name] Dial:  [phone]  Fax:  [phone] E-mail: [name] [address], [place], [place]  [address]

## 2016-10-05 NOTE — Telephone Encounter (Signed)
Pt called requesting a refill on his Lurasidone HCl (LATUDA PO). He is completely out of medication. Please advise, thank you.  Call pt @ [phone]  Pharmacy - [facility] - [place], [place] - [address] [facility] [address]

## 2016-10-05 NOTE — Telephone Encounter (Signed)
Refilled until he can see new psychiatrist.I would advise that future refills go through prior psychiatrist in [place] if there are any more gaps  Going forward he needs to maintaIN a psychiatrist to stay on medication

## 2016-10-05 NOTE — Telephone Encounter (Signed)
Pt called and stated that he saw M. Arnett on 5/1 with face pain. Pt states that she prescribed him Augmentin and the face pain has subsided a little but not all the way. He is just about out of medication, he has 1 day left. Please advise, thank you.  Call pt@ [phone]

## 2016-10-06 NOTE — Telephone Encounter (Signed)
Any news on referral to psychiatrist?

## 2016-10-07 NOTE — Telephone Encounter (Signed)
I do not have a referral for him. He is not in my print out either. I will check with Dr. Maryruth Bun to see if she can see him. If not I will need you to enter another one.

## 2016-10-08 ENCOUNTER — Telehealth: Payer: Self-pay | Admitting: Family

## 2016-10-08 NOTE — Telephone Encounter (Signed)
Left message for patient to return call back.  

## 2016-10-08 NOTE — Telephone Encounter (Signed)
Call pt  Is he better on augmentin?  We have heard from ENT and they will not him unless he has had 3 sinus infections.   I felt his symptoms atypical which is why I went ahead and placed the referral.   Please let me know if not better

## 2016-10-08 NOTE — Telephone Encounter (Signed)
Referral placed.

## 2016-10-09 NOTE — Telephone Encounter (Signed)
Referral was faxed to Dr. Shelda Altes office

## 2016-10-09 NOTE — Telephone Encounter (Signed)
Patient has been notified. Also patient is doing better on 2nd dose of Augmentin.

## 2016-10-12 ENCOUNTER — Telehealth: Payer: Self-pay | Admitting: *Deleted

## 2016-10-12 NOTE — Telephone Encounter (Signed)
Patient requested a update on the script for latuda, pt stated that pharmacy can not fill this with out a PA Contact [phone]

## 2016-10-21 ENCOUNTER — Telehealth: Payer: Self-pay | Admitting: *Deleted

## 2016-10-21 ENCOUNTER — Other Ambulatory Visit: Payer: Self-pay

## 2016-10-21 DIAGNOSIS — E118 Type 2 diabetes mellitus with unspecified complications: Principal | ICD-10-CM

## 2016-10-21 DIAGNOSIS — Z794 Long term (current) use of insulin: Principal | ICD-10-CM

## 2016-10-21 DIAGNOSIS — E1165 Type 2 diabetes mellitus with hyperglycemia: Secondary | ICD-10-CM

## 2016-10-21 DIAGNOSIS — IMO0002 Reserved for concepts with insufficient information to code with codable children: Secondary | ICD-10-CM

## 2016-10-21 NOTE — Patient Outreach (Signed)
[facility]) Care Management  10/21/2016  Jose Merritt [birth year] [id number]   Called patient to notify him that he needs pre-authorization for Latuda.  MD needs to show that he has tried other products without success before trying Latuda.  Dr. Jason Coop will not have that information and I cannot see notes from the psychiatrist in the medical record.    I have encouraged Uzair to call his psychiatrist and have the medical records sent to Dr. Jason Coop so she can see what he has taken in the past. Nina acknowledged understanding.  He has received a referral for [facility] endocrinology- Dr. Tedd Sias is taking new patients and [facility] is awaiting an appointment; he would like to be seen in [place] instead of [place].    [name], RN, BA, MHA, CDE [facility] Diabetes Coordinator- [name] Dial:  [phone]  Fax:  [phone] E-mail: [name] [address], [place], [place]  [address]

## 2016-10-21 NOTE — Patient Outreach (Addendum)
[place] [facility]) Care Management  [place]  10/21/2016   Jose Merritt 13-Apr-1976 [id number]  Subjective: Met with Jose Merritt for a follow up visit in the [name] diabetes program.  He wore his CGMS for a brief period since I last saw him- then took it and his pump off this past weekend when he went to a concert out of town.  When he was wearing his CGMS, his blood sugars were 170m/dl (average).  He has been checking blood sugars which have ranged from 70-3242mdl.  When he went down to 7098ml, his pump went to "suspend" for about 45 minutes.   From our last appointment, he met with MD for ongoing face pain (resolved) and discussed needing a new endocrinologist (it appears to be scheduled for Oct 29, 2016).  He has not been walking.  He reports 3-4 episodes of mid-sternal chest pain which resolves on its own- he does not take Nitroglycerine although he has it. He reports the chest pain is usually in the am when he wakes up and radiates nowhere.  He does not go to the ED because of costs. There is no correlation between activity and chest pain.  He is not sexually active since his heart attack because of impotence.   Jose Merritt schedule a psychiatrist visit (although I'm afraid he won't go) for December 24, 2016. He spoke to Dr. ArnVidal Schwalbeout needing LatAnette Guarnerit the authorization is not complete and therefore he is still not on it.  He complains of ongoing depression- tells me "the psychiatrist asked me if I'd hurt myself- yup, do I own a gun-yup, would I use it-yup" , here- take this medication, then "I won't order it again until you see me". .   30 day blood sugar average 223m45m, fasting blood sugar today 241mg7m Wearing his insulin pump and his CGMS.  Encounter Medications:  Outpatient Encounter Prescriptions as of 10/21/2016  Medication Sig  . aspirin 81 MG chewable tablet Chew by mouth daily.  . atoMarland Kitchenvastatin (LIPITOR) 80 MG tablet Take 80 mg by mouth daily.  qam  . clopidogrel (PLAVIX) 75 MG tablet Take 75 mg by mouth daily. qam  . DULoxetine (CYMBALTA) 60 MG capsule Take 60 mg by mouth daily. qam  . insulin lispro (HUMALOG) 100 UNIT/ML injection Inject into the skin continuous. Medtronic 670G basal rate 4.0units/hour, I:C ratio 3.5gm/1 unit, goal 10pm-9am= 150mg/34m9am-10pm=100mg/d13mcorrection factor 1 units=17 points.  Also uses CGMS.  Continuous pump user since October 2017  . lisinopril (PRINIVIL,ZESTRIL) 5 MG tablet Take 5 mg by mouth daily. qam  . metoprolol succinate (TOPROL-XL) 100 MG 24 hr tablet Take 100 mg by mouth daily. Take with or immediately following a meal. qam  . spironolactone (ALDACTONE) 25 MG tablet Take 12.5 mg by mouth daily. qam  . amoxicillin-clavulanate (AUGMENTIN) 875-125 MG tablet Take 1 tablet by mouth 2 (two) times daily. (Patient not taking: Reported on 10/21/2016)  . lurasidone (LATUDA) 40 MG TABS tablet Take 1 tablet (40 mg total) by mouth 2 (two) times daily. Patient does not have a current order for this medication. (Patient not taking: Reported on 10/21/2016)  . nitroGLYCERIN (NITROSTAT) 0.3 MG SL tablet Place 0.3 mg under the tongue every 5 (five) minutes as needed.   No facility-administered encounter medications on file as of 10/21/2016.     Functional Status:  In your present state of health, do you have any difficulty performing the following activities: 08/17/2016  Hearing? N  Vision?  N  Difficulty concentrating or making decisions? N  Walking or climbing stairs? N  Dressing or bathing? N  Doing errands, shopping? N    Fall/Depression Screening: Fall Risk  09/16/2016 08/17/2016  Falls in the past year? No No   PHQ 2/9 Scores 10/21/2016 08/17/2016  PHQ - 2 Score 2 2  PHQ- 9 Score 12 5    Assessment: Casyn complains of the insulin pump only able to deliver 25 units of insulin at a time.  We discussed options of getting consistently on his CGMS so blood sugars are lower therefore requiring less insulin  and trying to eat smaller portions so he'll need less insulin.  If he need more than 25 units, he will set an alarm to recheck sugars in 2 hours.   Patient is actively engaged in setting goals. He tells me he is motivated when he visits with me but then loses motivation when he's at home for a while.  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 He verbalizes he doesn't like psychiatrists(has seen them since he was 11 yrs).  Plan:  [facility] CM Care Plan Problem One     Most Recent Value  Care Plan for Problem One  Active  [facility] Long Term Goal (31-90 days)  Patient will lower A1C to lower than 9.3% when it is checked in 3 months  [facility] Long Term Goal Start Date  10/05/16  Interventions for Problem One Long Term Goal  1. get f/u appointment with psychiatrist to get Latuda  2. Exercise 3x/week for 15 minutes  3.  Call Dr. Vidal Schwalbe re,  NO LATUDA   5. call endo to get an appointment if you want it in [place] Problem Two     [facility] [place] for Problem Two  Not Active  [facility] Long Term Goal Start Date  09/16/16  [facility] Long Term Goal Met Date  10/05/16      1. Wear CGMS daily 2. Discuss impotence with MD- is there options 3.Take your Latuda daily- may need to get a letter from past MD to prove you've been on other medications 4. If you eat over the "25 units of Humalog" the pump will deliver, recheck sugars in 2 hours to give more insulin 5. Ask MD about psychologist visits plus Dr. Vidal Schwalbe ordering Anette Guarneri- is that an option? 6. Schedule a dental visit 7. Go to eye exam tomorrow.  8. Goal weight for 11/11/16 -set by patient- 244.3lbs  Follow up in 3 weeks.   Gentry Fitz, RN, BA, [place], [place] Direct Dial:  [phone]  Fax:  [phone] E-mail: [email or web address] [address], [place], [place]  [address]

## 2016-10-21 NOTE — Telephone Encounter (Signed)
Patient requested a update in his referral for endocrinology , he also has other personal questions . Pt contact [phone]

## 2016-10-21 NOTE — Telephone Encounter (Signed)
I see order placed on 5/1, can you give me a update, thanks

## 2016-10-22 ENCOUNTER — Telehealth: Payer: Self-pay | Admitting: Family

## 2016-10-22 NOTE — Telephone Encounter (Signed)
Call pt  Read note from dm educator and very concerned about suicidality  He needs to go to ED if thoughts of suicide   We are working on referral to psychiatry however due to the acute nature of his needs advise to him to go IMMEDIATELY to    [facility] services anytime between 8am-3pm.  They have counseling and psychiatrists there who can see you right away.  They have walk ins on Monday, Wednesdays, and Fridays.  The address is [address] [place], [place] [address].  Phone number is [phone].   They are near [facility] and [facility].

## 2016-10-22 NOTE — Telephone Encounter (Signed)
Wants to cancel the appt with Dr.Ellison and and go to the [facility] [place] [facility], Dr. Garald Braver?   I reviewed notes with him, he will call them or go to ED if needed. Thanks

## 2016-10-22 NOTE — Telephone Encounter (Signed)
appt cancelled with Dr. Everardo All. Referral faxed to [facility] with Dr. Tedd Sias. They will contact him to schedule.

## 2016-10-22 NOTE — Telephone Encounter (Signed)
Noted thank you

## 2016-10-22 NOTE — Telephone Encounter (Signed)
Noted, thanks!

## 2016-10-29 ENCOUNTER — Ambulatory Visit: Payer: 59 | Admitting: Endocrinology

## 2016-11-11 ENCOUNTER — Other Ambulatory Visit: Payer: Self-pay

## 2016-11-11 VITALS — BP 100/78 | HR 85 | Ht 72.0 in | Wt 248.7 lb

## 2016-11-11 DIAGNOSIS — E1165 Type 2 diabetes mellitus with hyperglycemia: Secondary | ICD-10-CM

## 2016-11-11 DIAGNOSIS — E118 Type 2 diabetes mellitus with unspecified complications: Principal | ICD-10-CM

## 2016-11-11 DIAGNOSIS — Z794 Long term (current) use of insulin: Principal | ICD-10-CM

## 2016-11-11 DIAGNOSIS — IMO0002 Reserved for concepts with insufficient information to code with codable children: Secondary | ICD-10-CM

## 2016-11-11 NOTE — Patient Outreach (Signed)
[place] [facility]) Care Management  [place]  11/11/2016   Jose Merritt Oct 24, 1975 [id number]  Subjective: Met with Jose Merritt for his follow up [name] diabetes program through [facility].  He is feeling well today and has been wearing his insulin pump and continuous glucose monitor for 3 weeks and "feeling much better". His average BG in his pump is 141m/dl and on his CGMS is 15103mdl.  The pump has automatically gone to suspend mode a few times through the night in the last couple of weeks.  He had one low blood sugar in the middle of the night (4293ml).  He believes he probably over estimated his carbohydrate intake and received too much mealtime insulin.  His highest CBG was 350m29m. He is checking sugars 4.7 times per day.   He has placed glucose tablets in all vehicles and in his child's stroller.  He has scheduled endocrinology (July 20), psychiatry (July 26), eye doctor (August 2), dental (June 19), family doctor (June 20).    He is exercising 3x/week for 20 minutes. He is in the pool most days of the week. He denies chest pain in the past 3 weeks and says his neuropathy has improved.   He has eliminated all foods in the house he wouldn't want to his child to eat and is considering CSA (produce sent tot he house).  Today he ranks his health "good" and says he is not feeling down or is suicidal.   Objective:  Vitals:   11/11/16 0945  BP: 100/78  Pulse: 85  SpO2: 97%  Weight: 248 lb 11.2 oz (112.8 kg)  Height: 1.829 m (6')   Denies SOB  Encounter Medications:  Outpatient Encounter Prescriptions as of 11/11/2016  Medication Sig  . aspirin 81 MG chewable tablet Chew by mouth daily.  . atMarland Kitchenrvastatin (LIPITOR) 80 MG tablet Take 80 mg by mouth daily. qam  . clopidogrel (PLAVIX) 75 MG tablet Take 75 mg by mouth daily. qam  . DULoxetine (CYMBALTA) 60 MG capsule Take 60 mg by mouth daily. qam  . insulin lispro (HUMALOG) 100 UNIT/ML injection  Inject into the skin continuous. Medtronic 670G basal rate 4.0units/hour, I:C ratio 3.5gm/1 unit, goal 10pm-9am= 150mg34m 9am-10pm=100mg/38m correction factor 1 units=17 points.  Also uses CGMS.  Continuous pump user since October 2017  . lisinopril (PRINIVIL,ZESTRIL) 5 MG tablet Take 5 mg by mouth daily. qam  . metoprolol succinate (TOPROL-XL) 100 MG 24 hr tablet Take 100 mg by mouth daily. Take with or immediately following a meal. qam  . spironolactone (ALDACTONE) 25 MG tablet Take 12.5 mg by mouth daily. qam  . amoxicillin-clavulanate (AUGMENTIN) 875-125 MG tablet Take 1 tablet by mouth 2 (two) times daily. (Patient not taking: Reported on 10/21/2016)  . lurasidone (LATUDA) 40 MG TABS tablet Take 1 tablet (40 mg total) by mouth 2 (two) times daily. Patient does not have a current order for this medication. (Patient not taking: Reported on 10/21/2016)  . nitroGLYCERIN (NITROSTAT) 0.3 MG SL tablet Place 0.3 mg under the tongue every 5 (five) minutes as needed.   No facility-administered encounter medications on file as of 11/11/2016.     Functional Status:  In your present state of health, do you have any difficulty performing the following activities: 08/17/2016  Hearing? N  Vision? N  Difficulty concentrating or making decisions? N  Walking or climbing stairs? N  Dressing or bathing? N  Doing errands, shopping? N    Fall/Depression Screening: Fall Risk  09/16/2016 08/17/2016  Falls in the past year? No No   PHQ 2/9 Scores 11/11/2016 10/21/2016 08/17/2016  PHQ - 2 Score 0 2 2  PHQ- 9 Score '5 12 5    ' Assessment: [name] is engaged and feeling motivated this visit. He is using 60% basal insulin and 40% bolus insulin in his pump. I have given him the information about the [facility] behavioral health if he needs it.  We discussed the importance of wearing shoes to the pool and using sunscreen for his feet.   Plan:  [facility] CM Care Plan Problem One     Most Recent Value  Care Plan Problem One  Elevated  A1C   Role Documenting the Problem One  Care Management [place] for Problem One  Active  [facility] Long Term Goal   Patient will lower A1C to lower than 9.3% when it is checked in 3 months  Interventions for Problem One Long Term Goal  1. get f/u appointment with psychiatrist to get Latuda  2. Exercise 3x/week for 20 minutes   3. wear CGMS and pump daily, 4. schedule eye exam     Follow up in 3 weeks.   Gentry Fitz, RN, BA, MHA, CDE Diabetes Coordinator Inpatient Diabetes Program  [phone] (Team Pager) [phone] ([place]) 11/11/2016 10:54 AM

## 2016-11-18 ENCOUNTER — Ambulatory Visit (INDEPENDENT_AMBULATORY_CARE_PROVIDER_SITE_OTHER): Payer: 59 | Admitting: Family

## 2016-11-18 ENCOUNTER — Encounter: Payer: Self-pay | Admitting: Family

## 2016-11-18 VITALS — BP 104/64 | HR 88 | Temp 98.1°F | Ht 72.0 in | Wt 248.6 lb

## 2016-11-18 DIAGNOSIS — I10 Essential (primary) hypertension: Secondary | ICD-10-CM | POA: Diagnosis not present

## 2016-11-18 DIAGNOSIS — F3170 Bipolar disorder, currently in remission, most recent episode unspecified: Secondary | ICD-10-CM | POA: Diagnosis not present

## 2016-11-18 DIAGNOSIS — Z8673 Personal history of transient ischemic attack (TIA), and cerebral infarction without residual deficits: Secondary | ICD-10-CM | POA: Diagnosis not present

## 2016-11-18 DIAGNOSIS — E119 Type 2 diabetes mellitus without complications: Secondary | ICD-10-CM | POA: Diagnosis not present

## 2016-11-18 DIAGNOSIS — I252 Old myocardial infarction: Secondary | ICD-10-CM

## 2016-11-18 DIAGNOSIS — E785 Hyperlipidemia, unspecified: Secondary | ICD-10-CM | POA: Diagnosis not present

## 2016-11-18 DIAGNOSIS — I509 Heart failure, unspecified: Secondary | ICD-10-CM | POA: Diagnosis not present

## 2016-11-18 LAB — COMPREHENSIVE METABOLIC PANEL
ALBUMIN: 4.4 g/dL (ref 3.5–5.2)
ALK PHOS: 110 U/L (ref 39–117)
ALT: 18 U/L (ref 0–53)
AST: 12 U/L (ref 0–37)
BUN: 19 mg/dL (ref 6–23)
CO2: 24 mEq/L (ref 19–32)
Calcium: 9.9 mg/dL (ref 8.4–10.5)
Chloride: 100 mEq/L (ref 96–112)
Creatinine, Ser: 0.77 mg/dL (ref 0.40–1.50)
GFR: 117.01 mL/min (ref >60.00)
Glucose, Bld: 353 mg/dL — ABNORMAL HIGH (ref 70–99)
POTASSIUM: 4.6 meq/L (ref 3.5–5.1)
SODIUM: 133 meq/L — AB (ref 135–145)
TOTAL PROTEIN: 7.5 g/dL (ref 6.0–8.3)
Total Bilirubin: 0.8 mg/dL (ref 0.2–1.2)

## 2016-11-18 MED ORDER — NITROGLYCERIN 0.3 MG SL SUBL: 0.3000 mg | SUBLINGUAL_TABLET | SUBLINGUAL | 0 refills | Status: DC | PRN

## 2016-11-18 MED ORDER — ATORVASTATIN CALCIUM 80 MG PO TABS: 80.0000 mg | ORAL_TABLET | Freq: Every day | ORAL | 2 refills | Status: DC

## 2016-11-18 MED ORDER — DULOXETINE HCL 60 MG PO CPEP: 60.0000 mg | ORAL_CAPSULE | Freq: Every day | ORAL | 2 refills | Status: DC

## 2016-11-18 MED ORDER — SPIRONOLACTONE 25 MG PO TABS: 12.5000 mg | ORAL_TABLET | Freq: Every day | ORAL | 2 refills | Status: DC

## 2016-11-18 MED ORDER — CLOPIDOGREL BISULFATE 75 MG PO TABS: 75.0000 mg | ORAL_TABLET | Freq: Every day | ORAL | 2 refills | Status: DC

## 2016-11-18 MED ORDER — METOPROLOL SUCCINATE ER 100 MG PO TB24: 100.0000 mg | ORAL_TABLET | Freq: Every day | ORAL | 2 refills | Status: DC

## 2016-11-18 MED ORDER — LISINOPRIL 5 MG PO TABS: 5.0000 mg | ORAL_TABLET | Freq: Every day | ORAL | 2 refills | Status: DC

## 2016-11-18 MED ORDER — INSULIN LISPRO 100 UNIT/ML ~~LOC~~ SOLN: 100.0000 [IU] | SUBCUTANEOUS | 2 refills | Status: DC

## 2016-11-18 NOTE — Progress Notes (Signed)
Pre visit review using our clinic review tool, if applicable. No additional management support is needed unless otherwise documented below in the visit note. 

## 2016-11-18 NOTE — Assessment & Plan Note (Signed)
Controlled today. Will continue current regimen. Pending CMP.

## 2016-11-18 NOTE — Assessment & Plan Note (Signed)
Symptomatically stable. Pending appointment with Dr. Tedd Sias.

## 2016-11-18 NOTE — Assessment & Plan Note (Signed)
Symptomatically stable. Pending appointment with Dr. Maryruth Bun.

## 2016-11-18 NOTE — Assessment & Plan Note (Signed)
compliant with high-dose Lipitor. We'll continue

## 2016-11-18 NOTE — Progress Notes (Signed)
Subjective:    Patient ID: Jose Merritt, male    DOB: 09/08/1975, 41 y.o.   MRN: [id number]  CC: Jose Merritt is a 41 y.o. male who presents today for follow up.   HPI: DM II- new endocrinologist, Dr Tedd Sias, and has appointment July 20th; following with DM nutritionist  Bipolar- psychiatrist; insurance won't pay latuda. Off latuda for one week.  ON cymbalta now. Feeling good the past  No thoughts of hurting himself or anyone else.   H/o MI; echo per patient and had EF 19%.  Had last seen cardiology 06/2016  On beta blocker, plavix, lipitor Few times notes chest pain - 6-7 times. Has nitro and took only once. Denies  numbness or tingling radiating to left arm or jaw, palpitations, dizziness, frequent headaches, changes in vision, or shortness of breath during episode.            HISTORY:  Past Medical History:  Diagnosis Date  . Asthma   . Bipolar disorder (HCC)   . Chicken pox   . Depression   . Diabetes mellitus without complication (HCC) 2003   Type 2  . Hyperlipidemia   . Hypertension   . Myocardial infarction (HCC) 06/2015  . Retinopathy of both eyes 01/2016  . SOB (shortness of breath) 08/17/2016  . Stroke [facility])    Past Surgical History:  Procedure Laterality Date  . CORONARY ANGIOPLASTY WITH STENT PLACEMENT Left 06/2015   LAD  . WISDOM TOOTH EXTRACTION     Family History  Problem Relation Age of Onset  . Hypertension Mother   . Arthritis Mother   . Heart disease Father        3 heart attacks  . Diabetes Father   . Hypertension Father   . Hyperlipidemia Father   . Alcohol abuse Father   . Hypertension Brother   . Hyperlipidemia Brother   . Heart disease Maternal Uncle   . Heart disease Paternal Aunt   . Heart disease Maternal Grandfather   . Hyperlipidemia Maternal Grandfather   . Diabetes Maternal Grandfather   . Kidney disease Maternal Grandfather   . Heart disease Paternal Grandmother   . Kidney disease Paternal Grandmother    . Hypertension Maternal Grandmother   . Kidney disease Maternal Grandmother   . Arthritis Maternal Grandmother   . Kidney disease Paternal Grandfather     Allergies: Other; Sulfa antibiotics; and Contrast media [iodinated diagnostic agents] Current Outpatient Prescriptions on File Prior to Visit  Medication Sig Dispense Refill  . amoxicillin-clavulanate (AUGMENTIN) 875-125 MG tablet Take 1 tablet by mouth 2 (two) times daily. 14 tablet 0  . aspirin 81 MG chewable tablet Chew by mouth daily.    Marland Kitchen lurasidone (LATUDA) 40 MG TABS tablet Take 1 tablet (40 mg total) by mouth 2 (two) times daily. Patient does not have a current order for this medication. 180 tablet 1   No current facility-administered medications on file prior to visit.     Social History  Substance Use Topics  . Smoking status: Never Smoker  . Smokeless tobacco: Never Used  . Alcohol use Yes     Comment: rare- 2x/year    Review of Systems  Constitutional: Negative for chills and fever.  Respiratory: Positive for shortness of breath (chronic since MI). Negative for cough and wheezing.   Cardiovascular: Negative for chest pain, palpitations and leg swelling.  Gastrointestinal: Negative for nausea and vomiting.      Objective:    BP 104/64   Pulse  88   Temp 98.1 F (36.7 C) (Oral)   Ht 6' (1.829 m)   Wt 248 lb 9.6 oz (112.8 kg)   SpO2 95%   BMI 33.72 kg/m  BP Readings from Last 3 Encounters:  11/18/16 104/64  11/11/16 100/78  10/05/16 110/74   Wt Readings from Last 3 Encounters:  11/18/16 248 lb 9.6 oz (112.8 kg)  11/11/16 248 lb 11.2 oz (112.8 kg)  09/29/16 250 lb (113.4 kg)    Physical Exam  Constitutional: He appears well-developed and well-nourished.  Cardiovascular: Regular rhythm and normal heart sounds.   Pulmonary/Chest: Effort normal and breath sounds normal. No respiratory distress. He has no wheezes. He has no rhonchi. He has no rales.  Neurological: He is alert.  Skin: Skin is warm and  dry.  Psychiatric: He has a normal mood and affect. His speech is normal and behavior is normal.  Vitals reviewed.      Assessment & Plan:   Problem List Items Addressed This Visit      Cardiovascular and Mediastinum   HTN (hypertension) - Primary    Controlled today. Will continue current regimen. Pending CMP.      Relevant Medications   atorvastatin (LIPITOR) 80 MG tablet   spironolactone (ALDACTONE) 25 MG tablet   nitroGLYCERIN (NITROSTAT) 0.3 MG SL tablet   metoprolol succinate (TOPROL-XL) 100 MG 24 hr tablet   lisinopril (PRINIVIL,ZESTRIL) 5 MG tablet   Other Relevant Orders   Comprehensive metabolic panel   Heart failure (HCC)    No increased shortness of breath, leg swelling. No adventitious lung sounds to suggest fluid volume overload. Currently on spirolactone, beta blocker; will continue until seen by cardiology.       Relevant Medications   atorvastatin (LIPITOR) 80 MG tablet   spironolactone (ALDACTONE) 25 MG tablet   nitroGLYCERIN (NITROSTAT) 0.3 MG SL tablet   metoprolol succinate (TOPROL-XL) 100 MG 24 hr tablet   lisinopril (PRINIVIL,ZESTRIL) 5 MG tablet     Endocrine   Diabetes mellitus without complication (HCC)    Symptomatically stable. Pending appointment with Dr. Tedd Sias.      Relevant Medications   atorvastatin (LIPITOR) 80 MG tablet   insulin lispro (HUMALOG) 100 UNIT/ML injection   lisinopril (PRINIVIL,ZESTRIL) 5 MG tablet     Other   Bipolar disorder in partial remission (HCC)    Symptomatically stable. Pending appointment with Dr. Maryruth Bun.      Relevant Medications   DULoxetine (CYMBALTA) 60 MG capsule   HLD (hyperlipidemia)    compliant with high-dose Lipitor. We'll continue      Relevant Medications   atorvastatin (LIPITOR) 80 MG tablet   spironolactone (ALDACTONE) 25 MG tablet   nitroGLYCERIN (NITROSTAT) 0.3 MG SL tablet   metoprolol succinate (TOPROL-XL) 100 MG 24 hr tablet   lisinopril (PRINIVIL,ZESTRIL) 5 MG tablet   History of  CVA (cerebrovascular accident)    No record of CVA. Per patient, he had TIA. Compliant with Plavix, Lipitor. Per patient he has never had a carotid duplex. Pending      Relevant Medications   atorvastatin (LIPITOR) 80 MG tablet   clopidogrel (PLAVIX) 75 MG tablet   Other Relevant Orders   US Carotid Duplex Bilateral   History of MI (myocardial infarction)    No chest pain today. Reassured by normal ekg - no prior to compare. Evidence of perhaps old inferior infarct however no new ischemia seen. On beta blocker, prn nitro ( hasn't used in year). Referral to cardiology for further evaluation/management. Advised very  close vigilance and if any recurrent chest pain to go to emergency room.      Relevant Medications   nitroGLYCERIN (NITROSTAT) 0.3 MG SL tablet   metoprolol succinate (TOPROL-XL) 100 MG 24 hr tablet   lisinopril (PRINIVIL,ZESTRIL) 5 MG tablet   Other Relevant Orders   US Carotid Duplex Bilateral   EKG 12-Lead (Completed)   Ambulatory referral to Cardiology       I have changed Jose Merritt's atorvastatin, clopidogrel, DULoxetine, insulin lispro, spironolactone, nitroGLYCERIN, metoprolol succinate, and lisinopril. I am also having him maintain his aspirin, amoxicillin-clavulanate, and lurasidone.   Meds ordered this encounter  Medications  . atorvastatin (LIPITOR) 80 MG tablet    Sig: Take 1 tablet (80 mg total) by mouth daily. qam    Dispense:  90 tablet    Refill:  2  . clopidogrel (PLAVIX) 75 MG tablet    Sig: Take 1 tablet (75 mg total) by mouth daily. qam    Dispense:  90 tablet    Refill:  2  . DULoxetine (CYMBALTA) 60 MG capsule    Sig: Take 1 capsule (60 mg total) by mouth daily. qam    Dispense:  90 capsule    Refill:  2  . insulin lispro (HUMALOG) 100 UNIT/ML injection    Sig: Inject 1 mL (100 Units total) into the skin continuous. Medtronic 670G basal rate 4.0units/hour, I:C ratio 3.5gm/1 unit, goal 10pm-9am= 150mg /dl, [id number]=$[id number]$$[id number]_[id number]$ /dl,  correction  factor 1 units=17 points.  Also uses CGMS.  Continuous pump user since October 2017    Dispense:  10 mL    Refill:  2  . spironolactone (ALDACTONE) 25 MG tablet    Sig: Take 0.5 tablets (12.5 mg total) by mouth daily. qam    Dispense:  45 tablet    Refill:  2  . nitroGLYCERIN (NITROSTAT) 0.3 MG SL tablet    Sig: Place 1 tablet (0.3 mg total) under the tongue every 5 (five) minutes as needed.    Dispense:  90 tablet    Refill:  0  . metoprolol succinate (TOPROL-XL) 100 MG 24 hr tablet    Sig: Take 1 tablet (100 mg total) by mouth daily. Take with or immediately following a meal. qam    Dispense:  90 tablet    Refill:  2  . lisinopril (PRINIVIL,ZESTRIL) 5 MG tablet    Sig: Take 1 tablet (5 mg total) by mouth daily. qam    Dispense:  90 tablet    Refill:  2    Return precautions given.   Risks, benefits, and alternatives of the medications and treatment plan prescribed today were discussed, and patient expressed understanding.   Education regarding symptom management and diagnosis given to patient on AVS.  Continue to follow with Allegra Grana, FNP for routine health maintenance.   Dixie Dials and I agreed with plan.   Jose Plowman, FNP

## 2016-11-18 NOTE — Assessment & Plan Note (Addendum)
No increased shortness of breath, leg swelling. No adventitious lung sounds to suggest fluid volume overload. Currently on spirolactone, beta blocker; will continue until seen by cardiology.

## 2016-11-18 NOTE — Assessment & Plan Note (Signed)
No record of CVA. Per patient, he had TIA. Compliant with Plavix, Lipitor. Per patient he has never had a carotid duplex. Pending

## 2016-11-18 NOTE — Patient Instructions (Addendum)
Pleasure seeing you  Labs  Referral to cardiology  Please stay hyper vigilant with symptoms - any chest pain please go directly to emergency room especially due to your history.

## 2016-11-18 NOTE — Assessment & Plan Note (Addendum)
No chest pain today. Reassured by normal ekg - no prior to compare. Evidence of perhaps old inferior infarct however no new ischemia seen. On beta blocker, prn nitro ( hasn't used in year). Referral to cardiology for further evaluation/management. Advised very close vigilance and if any recurrent chest pain to go to emergency room.

## 2016-11-25 ENCOUNTER — Telehealth: Payer: Self-pay

## 2016-11-25 DIAGNOSIS — E119 Type 2 diabetes mellitus without complications: Secondary | ICD-10-CM

## 2016-11-25 MED ORDER — INSULIN LISPRO 100 UNIT/ML ~~LOC~~ SOLN: 100.0000 [IU] | SUBCUTANEOUS | 2 refills | Status: DC

## 2016-11-25 NOTE — Telephone Encounter (Signed)
QUANTITY CORRECTED AND RX SIGNED

## 2016-11-25 NOTE — Telephone Encounter (Signed)
Pharmacy calls to clarify script sent in for  insulin lispro (HUMALOG) 100 UNIT/ML injection Inject 1 mL (100 Units total) into the skin continuous. Medtronic 670G basal rate 4.0units/hour, I:C ratio 3.5gm/1 unit, goal 10pm-9am= 150mg /dl, 9am-10pm=100mg /dl, correction factor 1 [id number]=$[id number]$$KGYJEHUDJSHFWYOV_ZCHYIFOYDXAJOINOMVEHMCNOBSJGGEZM$ points. Also uses CGMS. Continuous pump user since October 2017 - Subcutaneous It was sent in for 10 ml , pharmacy states there thinking it should be sent in for 10 vials  Please clarify

## 2016-11-30 ENCOUNTER — Other Ambulatory Visit: Payer: Self-pay

## 2016-11-30 NOTE — Patient Outreach (Signed)
[facility]) Care Management  11/30/2016  Jose Merritt 08-Jul-1975 [id number]  Patient did not show for his scheduled visit- I have sent him an e-mail- he states he forgot and wants to reschedule.     [name], RN, BA, MHA, CDE [facility] Diabetes Coordinator- [name] Dial:  [phone]  Fax:  [phone] E-mail: [name] [address], [place], [place]  [address]

## 2016-12-01 ENCOUNTER — Ambulatory Visit: Payer: 59

## 2016-12-09 ENCOUNTER — Other Ambulatory Visit: Payer: Self-pay

## 2016-12-09 NOTE — Patient Outreach (Signed)
[facility]) Care Management  12/09/2016  [name] [birth year] [id number]    Jose Merritt was scheduled to visit with me on November 30, 2016.  There was 3 weeks between appointments so prior to the July appointment, on 11/25/16 he wrote: Just wanted to say I took my pump off Sunday afternoon and here it is Friday and I still don't have it on. (Im going to). The last three days have been terrible, I got sick yesterday and this morning. My blood sugar was 648 & 544 So my A1C will prolly not be good now. Been depressed and I don't know what's wrong. Heart failure or whatever. I'm tired physically but not mentally. Sucks not being able to breathe. Ok enough complaining. Just don't have anyone to talk to around here. See you on the 2nd.   He did not show on July 2 so I reached out to him by e-mail and scheduled for today, July 11th.  He did not show. I will reschedule to see him.   [name], RN, BA, MHA, CDE [facility] Diabetes Coordinator- [name] Dial:  [phone]  Fax:  [phone] E-mail: [name] [address], [place], [place]  [address]

## 2016-12-14 DIAGNOSIS — F39 Unspecified mood [affective] disorder: Secondary | ICD-10-CM | POA: Diagnosis not present

## 2016-12-14 DIAGNOSIS — F5105 Insomnia due to other mental disorder: Secondary | ICD-10-CM | POA: Diagnosis not present

## 2016-12-18 DIAGNOSIS — E1165 Type 2 diabetes mellitus with hyperglycemia: Secondary | ICD-10-CM | POA: Diagnosis not present

## 2016-12-18 DIAGNOSIS — Z9641 Presence of insulin pump (external) (internal): Secondary | ICD-10-CM | POA: Diagnosis not present

## 2016-12-18 DIAGNOSIS — E11319 Type 2 diabetes mellitus with unspecified diabetic retinopathy without macular edema: Secondary | ICD-10-CM | POA: Diagnosis not present

## 2016-12-18 DIAGNOSIS — E1159 Type 2 diabetes mellitus with other circulatory complications: Secondary | ICD-10-CM | POA: Diagnosis not present

## 2016-12-18 DIAGNOSIS — Z794 Long term (current) use of insulin: Secondary | ICD-10-CM | POA: Diagnosis not present

## 2016-12-18 LAB — BASIC METABOLIC PANEL
BUN: 13 (ref 4–21)
Creatinine: 0.7 (ref 0.6–1.3)
GLUCOSE: 214
POTASSIUM: 4.4 (ref 3.4–5.3)
SODIUM: 137 (ref 137–147)

## 2016-12-18 LAB — LIPID PANEL
Cholesterol: 113 (ref 0–200)
HDL: 33 — AB (ref 35–70)
LDL Cholesterol: 52
LDl/HDL Ratio: 3.4
Triglycerides: 137 (ref 40–160)

## 2016-12-18 LAB — HEPATIC FUNCTION PANEL
ALT: 14 (ref 10–40)
AST: 10 — AB (ref 14–40)
Alkaline Phosphatase: 107 (ref 25–125)

## 2016-12-18 LAB — HEMOGLOBIN A1C: Hemoglobin A1C: 8.7

## 2016-12-21 ENCOUNTER — Ambulatory Visit: Payer: Self-pay

## 2016-12-24 ENCOUNTER — Ambulatory Visit (HOSPITAL_COMMUNITY): Payer: Self-pay | Admitting: Psychiatry

## 2016-12-30 DIAGNOSIS — F39 Unspecified mood [affective] disorder: Secondary | ICD-10-CM | POA: Diagnosis not present

## 2016-12-30 DIAGNOSIS — F5105 Insomnia due to other mental disorder: Secondary | ICD-10-CM | POA: Diagnosis not present

## 2017-01-04 ENCOUNTER — Other Ambulatory Visit: Payer: Self-pay

## 2017-01-04 NOTE — Patient Outreach (Signed)
[facility]) Care Management  01/04/2017  Jp Eastham [birth year] [id number]  Patient did not show for scheduled visit this am. He has met with a dentist, endocrinology (Dr. Gabriel Carina) and he has a scheduled cardiology visit scheduled for 02/09/17.    Gentry Fitz, RN, BA, [place], [place] Direct Dial:  [phone]  Fax:  [phone] E-mail: [email or web address] [address] [place], [place]  [address]

## 2017-01-06 NOTE — Patient Outreach (Signed)
[facility]) Care Management  01/04/17  Jose Merritt [birth year] [id number]   Spoke to patient by phone regarding missed appointment- patient states he did not have the appointment written down.  He reports he saw Dr. Tedd Sias a few weeks ago, but cancelled yesterday's appointment at her office with the pump trainer.  Dr. Tedd Sias made no pump adjustments at the MD visit.   He has seen Dr. Maryruth Bun twice since I last saw him- 1st visit she added a new medication, then "about 10 days later, she added another one". "I don't think they are working- I don't feel any better".  [name] does tell me he has had a tough couple of weeks- last week his dog died.  [name] has an appointment with [facility] next Monday, August 13 (at the request of Dr. Maryruth Bun), so I have scheduled an appointment just before that.  He has rescheduled with the pump trainer for August 14th.  He is currently NOT wearing his sensor but I have suggested wearing the pump will give the pump trainer more information.  [name] has agreed to put the sensor on after this phone call and let me know when it is on.  Follow up August 13, 1:45pm.    [name], RN, BA, MHA, CDE [facility] Diabetes Coordinator- [name] Direct Dial:  [phone]  Fax:  [phone] E-mail: [name] [address] [place], [place]  [address]

## 2017-01-11 ENCOUNTER — Other Ambulatory Visit: Payer: Self-pay

## 2017-01-11 VITALS — BP 120/60 | Ht 72.0 in | Wt 248.3 lb

## 2017-01-11 DIAGNOSIS — IMO0002 Reserved for concepts with insufficient information to code with codable children: Secondary | ICD-10-CM

## 2017-01-11 DIAGNOSIS — E1165 Type 2 diabetes mellitus with hyperglycemia: Secondary | ICD-10-CM

## 2017-01-11 DIAGNOSIS — E118 Type 2 diabetes mellitus with unspecified complications: Principal | ICD-10-CM

## 2017-01-11 DIAGNOSIS — Z794 Long term (current) use of insulin: Principal | ICD-10-CM

## 2017-01-11 NOTE — Patient Outreach (Signed)
Jose Merritt) Care Management  Jose Merritt  01/11/2017   Jose Merritt 02-05-1976 [id number]  Subjective: Patient in for his routine [name] visit.  He complains about a difficult past few weeks- he had 2 nephews visit for 3 weeks without their parents, dog had to be put to sleep, medication changes, feels tired, not managing his blood sugars- not wearing his sensor because it won't connect to the pump (frustration), building a house with his wife but they are having marital difficulties, financial strain. He is near tears and reports having considered suicide in the recent past, owns a gun and although MD asked him to get rid of it- he will not - but tells me he would "chose a different method any ways". He is taking all his psychiatric medications as ordered and seeing the psychiatrist.    He is wearing his pump but not his sensor. He is only checking blood sugars once a day- they are 248m/dl fasting (pre- lunch) and 375-5037mdl 2 hours after a meal.  Because his sensor is not working, KeTadeos sometimes guessing how much insulin to take. He is frustrated and feeling defeated.   Objective:  Vitals:   01/11/17 1413  BP: 120/60  Weight: 248 lb 4.8 oz (112.6 kg)  Height: 1.829 m (6')     Encounter Medications:  Outpatient Encounter Prescriptions as of 01/11/2017  Medication Sig  . aspirin 81 MG chewable tablet Chew by mouth daily.  . Marland Kitchentorvastatin (LIPITOR) 80 MG tablet Take 1 tablet (80 mg total) by mouth daily. qam  . clopidogrel (PLAVIX) 75 MG tablet Take 1 tablet (75 mg total) by mouth daily. qam  . divalproex (DEPAKOTE ER) 500 MG 24 hr tablet Take 1,000 mg by mouth every morning.  . DULoxetine (CYMBALTA) 60 MG capsule Take 1 capsule (60 mg total) by mouth daily. qam  . insulin lispro (HUMALOG) 100 UNIT/ML injection Inject 1 mL (100 Units total) into the skin continuous. Medtronic 670G basal rate 4.0units/hour, I:C ratio 3.5gm/1 unit, goal  10pm-9am= 15052ml, 9am-10pm=100m23m,  correction factor 1 units=17 points.  Also uses CGMS.  Continuous pump user since October 2017  . lisinopril (PRINIVIL,ZESTRIL) 5 MG tablet Take 1 tablet (5 mg total) by mouth daily. qam  . metoprolol succinate (TOPROL-XL) 100 MG 24 hr tablet Take 1 tablet (100 mg total) by mouth daily. Take with or immediately following a meal. qam  . sertraline (ZOLOFT) 50 MG tablet Take 50 mg by mouth daily.  . spMarland Kitchenronolactone (ALDACTONE) 25 MG tablet Take 0.5 tablets (12.5 mg total) by mouth daily. qam  . amoxicillin-clavulanate (AUGMENTIN) 875-125 MG tablet Take 1 tablet by mouth 2 (two) times daily. (Patient not taking: Reported on 01/11/2017)  . lurasidone (LATUDA) 40 MG TABS tablet Take 1 tablet (40 mg total) by mouth 2 (two) times daily. Patient does not have a current order for this medication. (Patient not taking: Reported on 01/11/2017)  . nitroGLYCERIN (NITROSTAT) 0.3 MG SL tablet Place 1 tablet (0.3 mg total) under the tongue every 5 (five) minutes as needed. (Patient not taking: Reported on 01/11/2017)   No facility-administered encounter medications on file as of 01/11/2017.     Functional Status:  In your present state of health, do you have any difficulty performing the following activities: 08/17/2016  Hearing? N  Vision? N  Difficulty concentrating or making decisions? N  Walking or climbing stairs? N  Dressing or bathing? N  Doing errands, shopping? N    Fall/Depression Screening:  Fall Risk  01/11/2017 09/16/2016 08/17/2016  Falls in the past year? No No No  Risk for fall due to : Other (Comment) - -  Risk for fall due to: Comment medications, hypoglycemia - -   PHQ 2/9 Scores 01/11/2017 11/11/2016 10/21/2016 08/17/2016  PHQ - 2 Score 6 0 2 2  PHQ- 9 Score _0 Assessment: I had Jose Merritt meet briefly with our pump trainer- she encouraged him to call the insulin pump company to help solve the connectivity problem with his sensor. She reviewed the  pump and identified the same issue Jose Merritt found- unable to connect.  He has not gained weight and has scheduled a dilated eye and cardiology appointment.  He has met with the endocrinologist and A1C has improved.  We discussed the importance of exercise, the need to have data for his MD's (this will help the MDs make informed changes to his pump settings and his medications).  He did not get labwork drawn as requested by the physiatrist.   He met with the employ assistance program after I visited with him- he met with her for an hour and has scheduled to see her again in early September. He stopped by to see me after that visit. His demeanor was much improved since the beginning of our visit today.    Plan:  Jose Merritt CM Care Plan Problem One     Most Recent Value  Care Plan for Problem One  Not Active  [facility] Long Term Goal   Patient will lower A1C to lower than 9.3% when it is checked in 3 months  [facility]N Long Term Goal Start Date  10/05/16  [facility] Long Term Goal Met Date  12/22/16    [facility] CM Care Plan Problem Two     Most Recent Value  Care Plan Problem Two  A1C 8.7%, higher than ADA goal of 7%  Role Documenting the Problem Two  Care Management [place] for Problem Two  Active  Interventions for Problem Two Long Term Goal   1. Get sensor on after you call the pump company to find out why it's not connecting  2. Check strips are within date- throw them away if they are not  3. Go to [facility] to get your blood drawn  4. aim for exercise 150 minutes per week   [facility]N Long Term Goal  Patient will move A1C down from 8.7% down towards ADA goal of 7%  [facility]N Long Term Goal Start Date  01/11/17     Follow up next week by phone.   Jose Fitz, RN, BA, [place], [place] Direct Dial:  [phone]  Fax:  [phone] E-mail: [email or web address] [address], [place], [place]  [address]

## 2017-01-20 ENCOUNTER — Other Ambulatory Visit: Payer: Self-pay

## 2017-01-20 NOTE — Patient Outreach (Signed)
[facility]) Care Management  01/20/2017  Jose Merritt 09/29/1975 [id number]   Unable to reach patient by phone. Left a voice message for him to return my call.     [name], RN, BA, MHA, CDE [facility] Diabetes Coordinator- [name] Dial:  [phone]  Fax:  [phone] E-mail: [name] [address] [place], [place]  [address]

## 2017-01-20 NOTE — Patient Outreach (Signed)
[facility]) Care Management  01/20/2017  Jose Merritt 1975-09-15 [id number]   [facility] sent me an e-mail telling me he has been to the dentist today.  Was in the dental chair for 2 1/2 hours - mouth is still numb.  We will talk next week.   [name], RN, BA, MHA, CDE [facility] Diabetes Coordinator- [name] Dial:  [phone]  Fax:  [phone] E-mail: [name] [address], [place], [place]  [address]

## 2017-01-27 ENCOUNTER — Other Ambulatory Visit: Payer: Self-pay

## 2017-01-27 DIAGNOSIS — E118 Type 2 diabetes mellitus with unspecified complications: Principal | ICD-10-CM

## 2017-01-27 DIAGNOSIS — Z794 Long term (current) use of insulin: Principal | ICD-10-CM

## 2017-01-27 DIAGNOSIS — IMO0002 Reserved for concepts with insufficient information to code with codable children: Secondary | ICD-10-CM

## 2017-01-27 DIAGNOSIS — E1165 Type 2 diabetes mellitus with hyperglycemia: Secondary | ICD-10-CM

## 2017-01-27 NOTE — Patient Outreach (Signed)
[facility]) Care Management  01/27/2017  Jose Merritt September 04, 1975 [id number]  I reached out to [facility] by phone today as a follow up to the e-mail that I sent yesterday.  [name] did not answer so I have left a message.     On Jan 26, 2017, at 4:56 PM, [name] [email or web address]> wrote: I'm going to give you a call tomorrow to follow up and see how you are- same old questions... how are the sugars, do you have your continuous glucose monitor on?  etc.  Can't wait to hear how the visit with your friend went.   I should get some time to call after lunch- talk to you then.    From [name]:  I will be here. Nothing to tell. I've been so depressed this past week. I took off my pump last Monday and haven't tested at all. We are broke so no over eating. Just wasting space really. If it wasn't for [facility] not sure what I'd be useful for. I refuse to lie to you, I am just failing miserably.      [name], RN, BA, MHA, CDE [facility] Diabetes Coordinator- [name] Direct Dial:  [phone]  Fax:  [phone] E-mail: [name] [address], [place], [place]  [address]

## 2017-01-27 NOTE — Patient Outreach (Signed)
[facility]) Care Management  01/27/2017  Jose Merritt [birth year] [id number]  Jose Merritt returned my call today.  He did put his pump on this morning but was having trouble with his transmitor for his continuous glucose monitor- he has called the company.   He had a friend visiting last week and got off his routine of taking his medications (including antidepressants)- he is feeling down - they are having financial problems and he and his wife are having some marital problems.  They are in the middle of building a house.   [name] and I have agreed to meet next week- he will have had his pump on for 1 week. He has been to the dentist for a clean (which he had not had for 10 years) and has an eye appointment scheduled.  He is not interested in going back to [facility].    [name], RN, BA, MHA, CDE [facility] Diabetes Coordinator- [name] Dial:  [phone]  Fax:  [phone] E-mail: [name] [address], [place], [place]  [address]

## 2017-02-03 ENCOUNTER — Other Ambulatory Visit: Payer: Self-pay

## 2017-02-03 VITALS — BP 117/80 | Ht 72.0 in | Wt 246.9 lb

## 2017-02-03 DIAGNOSIS — IMO0002 Reserved for concepts with insufficient information to code with codable children: Secondary | ICD-10-CM

## 2017-02-03 DIAGNOSIS — Z794 Long term (current) use of insulin: Principal | ICD-10-CM

## 2017-02-03 DIAGNOSIS — E1165 Type 2 diabetes mellitus with hyperglycemia: Secondary | ICD-10-CM

## 2017-02-03 DIAGNOSIS — E118 Type 2 diabetes mellitus with unspecified complications: Principal | ICD-10-CM

## 2017-02-03 NOTE — Patient Outreach (Signed)
[facility]) Care Management  [place]  02/03/2017   Carsin Randazzo Mar 06, 1976 [id number]  Subjective: Met with [name] in the [name] employer sponsored diabetes program.  Although Yer had some difficulty keeping his pump on in much of August (mental health- depression), he put it back on last week and has maintained the sensor and pump for 1 week- despite 2 elevated BG over the past week, his average CBG for 7 days was 159m/dl. He did have a couple of low blood sugars over the past week and "the insulin pump shut down".   He reports having no neuropathy, not feeling down or depressed, engaging in exercise, and willing to get the necessary blood work to get his Depakote re-ordered.  He has completed his dental cleaning (it's been over 10 years), has a scheduled eye exam, and has met with the counselor. He has reached out to Dr. KNicolasa Duckingto get a prescription refill and states he feels much better on this new medication (Depakote).  He continues to take all his ordered medications.  He has a scheduled appointment with Dr. GRockey Situon 02/09/17. Financially he and his wife are having difficulty which has decreased the number of times he is eating out and the amount of junk food he is bringing into the house.   Objective:  Vitals:   02/03/17 1115  BP: 117/80  Weight: 246 lb 14.4 oz (112 kg)  Height: 1.829 m (6')   Weight down 2 lbs from last month  Encounter Medications:  Outpatient Encounter Prescriptions as of 02/03/2017  Medication Sig  . aspirin 81 MG chewable tablet Chew by mouth daily.  .Marland Kitchenatorvastatin (LIPITOR) 80 MG tablet Take 1 tablet (80 mg total) by mouth daily. qam  . clopidogrel (PLAVIX) 75 MG tablet Take 1 tablet (75 mg total) by mouth daily. qam  . divalproex (DEPAKOTE ER) 500 MG 24 hr tablet Take 1,000 mg by mouth every morning.  . DULoxetine (CYMBALTA) 60 MG capsule Take 1 capsule (60 mg total) by mouth daily. qam  . insulin lispro (HUMALOG)  100 UNIT/ML injection Inject 1 mL (100 Units total) into the skin continuous. Medtronic 670G basal rate 4.0units/hour, I:C ratio 3.5gm/1 unit, goal 10pm-9am= 1553mdl, 9am-10pm=10074ml,  correction factor 1 units=17 points.  Also uses CGMS.  Continuous pump user since October 2017  . lisinopril (PRINIVIL,ZESTRIL) 5 MG tablet Take 1 tablet (5 mg total) by mouth daily. qam  . metoprolol succinate (TOPROL-XL) 100 MG 24 hr tablet Take 1 tablet (100 mg total) by mouth daily. Take with or immediately following a meal. qam  . sertraline (ZOLOFT) 50 MG tablet Take 50 mg by mouth daily.  . sMarland Kitchenironolactone (ALDACTONE) 25 MG tablet Take 0.5 tablets (12.5 mg total) by mouth daily. qam  . amoxicillin-clavulanate (AUGMENTIN) 875-125 MG tablet Take 1 tablet by mouth 2 (two) times daily. (Patient not taking: Reported on 01/11/2017)  . lurasidone (LATUDA) 40 MG TABS tablet Take 1 tablet (40 mg total) by mouth 2 (two) times daily. Patient does not have a current order for this medication. (Patient not taking: Reported on 01/11/2017)  . nitroGLYCERIN (NITROSTAT) 0.3 MG SL tablet Place 1 tablet (0.3 mg total) under the tongue every 5 (five) minutes as needed. (Patient not taking: Reported on 01/11/2017)   No facility-administered encounter medications on file as of 02/03/2017.     Functional Status:  In your present state of health, do you have any difficulty performing the following activities: 08/17/2016  Hearing? N  Vision? N  Difficulty concentrating or making decisions? N  Walking or climbing stairs? N  Dressing or bathing? N  Doing errands, shopping? N    Fall/Depression Screening: Fall Risk  02/03/2017 01/11/2017 09/16/2016  Falls in the past year? No No No  Risk for fall due to : - Other (Comment) -  Risk for fall due to: Comment - medications, hypoglycemia -   PHQ 2/9 Scores 02/03/2017 01/11/2017 11/11/2016 10/21/2016 08/17/2016  PHQ - 2 Score 0 6 0 2 2  PHQ- 9 Score 2 12 5 12 5    Assessment: Emotional  wellness much improved since our last visit. Routine calls and e-mails to patient providing encouragement and support given. Richardson reports feeling better emotionally and in turn experiencing no neuropathy and no chest pain.  Sparsh is engaging in some exercise.   Plan:  THN CM Care Plan Problem Two     Most Recent Value  Interventions for Problem Two Long Term Goal   1. Wear sensor and pump at all times  2. Complete labwork for medication levels and follow through with Dr. Kapur for refill 3. continue exercise increasing to 15 minutes, 5x/week  4. carry Glucose with you at all times  [facility]N Long Term Goal  Patient will move A1C down from 8.7% down towards ADA goal of 7%  [facility]N Long Term Goal Start Date  02/03/17     Although Kaiea has historically not used his Nitro for chest pain, he does admit he would use it if the "pain was really bad".  He does not have a prescription for or a bottle of nitroglycerine at home.    I had to remind him to carry glucose tabs with him at all times- he had a blood sugar of 60mg/dl in my office and nothing to treat it with.   I will follow up with him in one week by phone.    , RN, BA, MHA, CDE [facility] Diabetes Coordinator- [name] Direct Dial:  [phone]  Fax:  [phone] E-mail: [email or web address] [address], [place], [place]  [address]          

## 2017-02-04 DIAGNOSIS — F39 Unspecified mood [affective] disorder: Secondary | ICD-10-CM | POA: Diagnosis not present

## 2017-02-07 DIAGNOSIS — I25118 Atherosclerotic heart disease of native coronary artery with other forms of angina pectoris: Secondary | ICD-10-CM | POA: Insufficient documentation

## 2017-02-07 DIAGNOSIS — I5042 Chronic combined systolic (congestive) and diastolic (congestive) heart failure: Secondary | ICD-10-CM | POA: Insufficient documentation

## 2017-02-07 DIAGNOSIS — E1159 Type 2 diabetes mellitus with other circulatory complications: Secondary | ICD-10-CM | POA: Insufficient documentation

## 2017-02-07 NOTE — Progress Notes (Signed)
Cardiology Office Note  Date:  02/09/2017   ID:  Ayush, Boulet 1976-01-19, MRN [id number]  PCP:  Allegra Grana, FNP   Chief Complaint  Patient presents with  . OTHER    HX MI c/o sob . Meds reviewed verbally with pt.    HPI:  [name] is a pleasant 41 year old gentlemanwith history of CAD, Stent to LAD 06/2015, initially presented with shoulder painneck pain, shortness of breath/anginal equivalent DM II, HBA1C 9.3 Bipolar/depression HTN Hyperlipidemia Obesity Who presents to establish care in the [place] office, management of his coronary disease  Review of records shows Admitted 1/31-07/11/2015 for STEMI. 1 week prior to presentation he had left shoulder pain   treated at urgent care for bursitis with a steroid injection.   In the few days prior to presentation,   EKG with ST elevation. Cath with occluded mid LAD treated with DES. No residual disease.   Echo with ejection fraction 20% with probable LV thrombus. Started on warfarin. Metoprolol succinate, atorvastatin and lisinopril also initiated.   08/29/15 Repeat Cardiac cath, stable , stents patent Echocardiogram with improved ejection fraction (40%) and resolution of LV apical thrombus. Lifevest stopped Warfarin stopped, but he continued to take  In terms of his diabetes, he is followed by endocrinologist, Dr Vincente Liberty with DM nutritionist  Bipolar-he has seen psychiatrist;  Previous echocardiogram March 2017, ejection fraction 40%, up from 19%  On beta blocker, plavix, lipitor, Aspirin, Aldactone, lisinopril Denies significant chest pain, chronic shortness of breath on exertion since the heart attack  EKG personally reviewed by myself on todays visit Shows normal sinus rhythm rate 74 bpm old anterior MI   PMH:   has a past medical history of Asthma; Bipolar disorder (HCC); CHF (congestive heart failure) (HCC); Chicken pox; Clotting disorder (HCC); Depression; Diabetes mellitus  without complication (HCC) (2003); Heart murmur; Hyperlipidemia; Hypertension; Myocardial infarction (HCC) (06/2015); Retinopathy of both eyes (01/2016); SOB (shortness of breath) (08/17/2016); and Stroke [facility]  PSH:    Past Surgical History:  Procedure Laterality Date  . CORONARY ANGIOPLASTY WITH STENT PLACEMENT Left 06/2015   LADx1 stent  . WISDOM TOOTH EXTRACTION      Current Outpatient Prescriptions  Medication Sig Dispense Refill  . aspirin 81 MG chewable tablet Chew by mouth daily.    Marland Kitchen atorvastatin (LIPITOR) 80 MG tablet Take 1 tablet (80 mg total) by mouth daily. qam 90 tablet 2  . clopidogrel (PLAVIX) 75 MG tablet Take 1 tablet (75 mg total) by mouth daily. qam 90 tablet 2  . divalproex (DEPAKOTE ER) 500 MG 24 hr tablet Take 1,000 mg by mouth every morning.    . DULoxetine (CYMBALTA) 60 MG capsule Take 1 capsule (60 mg total) by mouth daily. qam 90 capsule 2  . insulin lispro (HUMALOG) 100 UNIT/ML injection Inject 1 mL (100 Units total) into the skin continuous. Medtronic 670G basal rate 4.0units/hour, I:C ratio 3.5gm/1 unit, goal 10pm-9am= /dl, 7WG-95AO=$[id number]$$[id number]$ /dl,  correction factor 1 BTDVV=61 points.  Also uses CGMS.  Continuous pump user since October 2017 10 vial 2  . lisinopril (PRINIVIL,ZESTRIL) 5 MG tablet Take 1 tablet (5 mg total) by mouth daily. qam 90 tablet 2  . metoprolol succinate (TOPROL-XL) 100 MG 24 hr tablet Take 1 tablet (100 mg total) by mouth daily. Take with or immediately following a meal. qam 90 tablet 2  . nitroGLYCERIN (NITROSTAT) 0.3 MG SL tablet Place 1 tablet (0.3 mg total) under the tongue every 5 (five) minutes as needed. 90 tablet 0  .  sertraline (ZOLOFT) 50 MG tablet Take 50 mg by mouth daily.    Marland Kitchen spironolactone (ALDACTONE) 25 MG tablet Take 0.5 tablets (12.5 mg total) by mouth daily. qam 45 tablet 2   No current facility-administered medications for this visit.      Allergies:   Other; Sulfa antibiotics; and Contrast media [iodinated  diagnostic agents]   Social History:  The patient  reports that he has never smoked. He has never used smokeless tobacco. He reports that he does not drink alcohol or use drugs.   Family History:   family history includes Alcohol abuse in his father; Arthritis in his maternal grandmother and mother; Diabetes in his father and maternal grandfather; Heart disease in his father, maternal grandfather, maternal uncle, paternal aunt, and paternal grandmother; Hyperlipidemia in his brother, father, and maternal grandfather; Hypertension in his brother, father, maternal grandmother, and mother; Kidney disease in his maternal grandfather, maternal grandmother, paternal grandfather, and paternal grandmother.    Review of Systems: Review of Systems  Constitutional: Negative.   Respiratory: Positive for shortness of breath.   Cardiovascular: Negative.   Gastrointestinal: Negative.   Musculoskeletal: Negative.   Neurological: Negative.   Psychiatric/Behavioral: Negative.   All other systems reviewed and are negative.    PHYSICAL EXAM: VS:  BP 111/76 (BP Location: Left Arm, Patient Position: Sitting, Cuff Size: Large)   Pulse 74   Ht 6' (1.829 m)   Wt 241 lb 8 oz (109.5 kg)   BMI 32.75 kg/m  , BMI Body mass index is 32.75 kg/m. GEN: Well nourished, well developed, in no acute distress  HEENT: normal  Neck: no JVD, carotid bruits, or masses Cardiac: RRR; no murmurs, rubs, or gallops,no edema  Respiratory:  clear to auscultation bilaterally, normal work of breathing GI: soft, nontender, nondistended, + BS MS: no deformity or atrophy  Skin: warm and dry, no rash Neuro:  Strength and sensation are intact Psych: euthymic mood, full affect    Recent Labs: 11/18/2016: ALT 18; BUN 19; Creatinine, Ser 0.77; Potassium 4.6; Sodium 133    Lipid Panel No results found for: CHOL, HDL, LDLCALC, TRIG    Wt Readings from Last 3 Encounters:  02/09/17 241 lb 8 oz (109.5 kg)  02/03/17 246 lb 14.4 oz  (112 kg)  01/11/17 248 lb 4.8 oz (112.6 kg)       ASSESSMENT AND PLAN:  Coronary artery disease of native artery of native heart with stable angina pectoris (HCC) - Plan: EKG 12-Lead Currently with no symptoms of angina. No further workup at this time. Continue current medication regimen.  Mixed hyperlipidemia Cholesterol is at goal on the current lipid regimen. No changes to the medications were made.  Type 2 diabetes mellitus with other circulatory complication, unspecified whether long term insulin use (HCC) Discussed diet strategies with him, he has guidance from nutritional counselor  Bipolar disorder, in partial remission, most recent episode depressed (HCC) Reports he is stable  Chronic combined systolic and diastolic CHF (congestive heart failure) (HCC) - Plan: EKG 12-Lead Ejection fraction 40% March 2017 Appears relatively euvolemic,Not on diuretics We did discuss medication such as entresto. He does not want to make a change at this time. Blood pressure is low, would likely be difficult to add entresto No medication changes made  Disposition:   F/U  6 months   Total encounter time more than 45 minutes  Greater than 50% was spent in counseling and coordination of care with the patient    Orders Placed This Encounter  Procedures  . EKG 12-Lead     Signed, Dossie Arbour, M.D., Ph.D. 02/09/2017  [facility] [place], [place] [phone]

## 2017-02-09 ENCOUNTER — Ambulatory Visit (INDEPENDENT_AMBULATORY_CARE_PROVIDER_SITE_OTHER): Payer: 59 | Admitting: Cardiovascular Disease

## 2017-02-09 ENCOUNTER — Encounter: Payer: Self-pay | Admitting: Cardiovascular Disease

## 2017-02-09 VITALS — BP 111/76 | HR 74 | Ht 72.0 in | Wt 241.5 lb

## 2017-02-09 DIAGNOSIS — E1159 Type 2 diabetes mellitus with other circulatory complications: Secondary | ICD-10-CM

## 2017-02-09 DIAGNOSIS — F3175 Bipolar disorder, in partial remission, most recent episode depressed: Secondary | ICD-10-CM | POA: Diagnosis not present

## 2017-02-09 DIAGNOSIS — E782 Mixed hyperlipidemia: Secondary | ICD-10-CM

## 2017-02-09 DIAGNOSIS — I5042 Chronic combined systolic (congestive) and diastolic (congestive) heart failure: Secondary | ICD-10-CM | POA: Diagnosis not present

## 2017-02-09 DIAGNOSIS — I25118 Atherosclerotic heart disease of native coronary artery with other forms of angina pectoris: Secondary | ICD-10-CM | POA: Diagnosis not present

## 2017-02-09 NOTE — Patient Instructions (Signed)

## 2017-02-10 ENCOUNTER — Other Ambulatory Visit: Payer: Self-pay
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
## 2017-02-12 ENCOUNTER — Ambulatory Visit: Payer: Self-pay | Admitting: Family

## 2017-02-15 ENCOUNTER — Telehealth: Payer: Self-pay | Admitting: Family

## 2017-02-15 ENCOUNTER — Encounter: Payer: Self-pay | Admitting: Family

## 2017-02-15 ENCOUNTER — Other Ambulatory Visit: Payer: Self-pay

## 2017-02-15 NOTE — Progress Notes (Signed)
Abstracted labs from [facility] DR. Solum, for [facility] Quality Metric see Care eery where [facility].

## 2017-02-15 NOTE — Telephone Encounter (Signed)
Courtesy call to pt-  Noted from nutritionist he had been having insulin pump problems. Wish I could help here however I would advise him to contact endocrine as certainly very concerned if pump were to shut down as he had mentioned.   Please advise endocrine f/u appt and to make them aware.

## 2017-02-15 NOTE — Telephone Encounter (Signed)
Patient stated he has an appointment in two weeks with endocrine.  Patient is having issues with receiving supplies because insurance will not cover supplies.

## 2017-02-15 NOTE — Patient Outreach (Signed)
[facility]) Care Management  02/15/2017  Jose Merritt Jul 02, 1975 [id number]   Called patient to follow up regarding blood sugars and mood.  No answer- left message to return my call.   [name], RN, BA, MHA, CDE [facility] Diabetes Coordinator- [name] Dial:  [phone]  Fax:  [phone] E-mail: [name] [address], [place], [place]  [address]

## 2017-02-19 ENCOUNTER — Telehealth: Payer: Self-pay

## 2017-02-19 NOTE — Telephone Encounter (Signed)
Spoke with ptient last week to cancel appointment. Pt stated he would call back to reschedule sometime this week.  Patient had not called back to reschedule so I reached out again to get patient back on schedule. Pt hung up when asked if he would like to reschedule.

## 2017-02-24 ENCOUNTER — Other Ambulatory Visit: Payer: Self-pay

## 2017-02-24 NOTE — Patient Outreach (Signed)
[facility]) Care Management  02/24/2017  Jose Merritt [birth year] [id number]   Sent an e-mail to Jose Merritt regarding his bill from [employer]- the bill is dated 08/13/16 but he did not enter the [name] program until 08/17/16.  If he did not get the supplies, he should cancel it and get a new prescription and re-order.  If he did receive the supplies, he will have to pay the bill. Awaiting his response.   [name], RN, BA, MHA, CDE [facility] Diabetes Coordinator- [name] Dial:  [phone]  Fax:  [phone] E-mail: [name] [address] [place], [place]  [address]

## 2017-02-24 NOTE — Patient Outreach (Signed)
[facility]) Care Management  02/24/2017  Jose Merritt 1976-01-10 [id number]   Spoke to [name] regarding pump and his mental health medications. [name] had missed his appointment with Dr. Maryruth Bun and I had asked him to reschedule.  His response on September 24th was;   " [name], [name]"  When I spoke to him today, he was not wearing his insulin pump because he has a bill for [employer].  He was carrying his insulin pen.   I have informed him that I will investigate the bill.   [name], RN, BA, MHA, CDE [facility] Diabetes Coordinator- [name] Dial:  [phone]  Fax:  [phone] E-mail: [name] [address], [place], [place]  [address]

## 2017-03-02 DIAGNOSIS — F39 Unspecified mood [affective] disorder: Secondary | ICD-10-CM | POA: Diagnosis not present

## 2017-03-02 DIAGNOSIS — F5105 Insomnia due to other mental disorder: Secondary | ICD-10-CM | POA: Diagnosis not present

## 2017-03-04 DIAGNOSIS — E113393 Type 2 diabetes mellitus with moderate nonproliferative diabetic retinopathy without macular edema, bilateral: Secondary | ICD-10-CM | POA: Diagnosis not present

## 2017-03-10 ENCOUNTER — Other Ambulatory Visit: Payer: Self-pay
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
## 2017-04-01 ENCOUNTER — Ambulatory Visit (INDEPENDENT_AMBULATORY_CARE_PROVIDER_SITE_OTHER): Payer: 59 | Admitting: Family

## 2017-04-01 ENCOUNTER — Encounter: Payer: Self-pay | Admitting: Family

## 2017-04-01 VITALS — BP 117/80 | HR 66 | Temp 98.0°F | Ht 72.0 in | Wt 234.0 lb

## 2017-04-01 DIAGNOSIS — Z8673 Personal history of transient ischemic attack (TIA), and cerebral infarction without residual deficits: Secondary | ICD-10-CM | POA: Diagnosis not present

## 2017-04-01 DIAGNOSIS — F3175 Bipolar disorder, in partial remission, most recent episode depressed: Secondary | ICD-10-CM

## 2017-04-01 DIAGNOSIS — F3170 Bipolar disorder, currently in remission, most recent episode unspecified: Secondary | ICD-10-CM | POA: Diagnosis not present

## 2017-04-01 DIAGNOSIS — E1159 Type 2 diabetes mellitus with other circulatory complications: Secondary | ICD-10-CM

## 2017-04-01 DIAGNOSIS — I5042 Chronic combined systolic (congestive) and diastolic (congestive) heart failure: Secondary | ICD-10-CM | POA: Diagnosis not present

## 2017-04-01 DIAGNOSIS — I252 Old myocardial infarction: Secondary | ICD-10-CM | POA: Diagnosis not present

## 2017-04-01 LAB — LIPID PANEL
CHOLESTEROL: 134 mg/dL (ref 0–200)
HDL: 28.1 mg/dL — ABNORMAL LOW (ref >39.00)
LDL Cholesterol: 67 mg/dL (ref 0–99)
NONHDL: 105.97
Total CHOL/HDL Ratio: 5
Triglycerides: 193 mg/dL — ABNORMAL HIGH (ref 0.0–149.0)
VLDL: 38.6 mg/dL (ref 0.0–40.0)

## 2017-04-01 LAB — COMPREHENSIVE METABOLIC PANEL
ALBUMIN: 4.3 g/dL (ref 3.5–5.2)
ALK PHOS: 113 U/L (ref 39–117)
ALT: 34 U/L (ref 0–53)
AST: 18 U/L (ref 0–37)
BILIRUBIN TOTAL: 0.5 mg/dL (ref 0.2–1.2)
BUN: 12 mg/dL (ref 6–23)
CO2: 23 mEq/L (ref 19–32)
Calcium: 9.6 mg/dL (ref 8.4–10.5)
Chloride: 102 mEq/L (ref 96–112)
Creatinine, Ser: 0.67 mg/dL (ref 0.40–1.50)
GFR: 137.15 mL/min (ref >60.00)
GLUCOSE: 370 mg/dL — AB (ref 70–99)
Potassium: 4.5 mEq/L (ref 3.5–5.1)
SODIUM: 134 meq/L — AB (ref 135–145)
TOTAL PROTEIN: 7.6 g/dL (ref 6.0–8.3)

## 2017-04-01 LAB — HEMOGLOBIN A1C: HEMOGLOBIN A1C: 10.9 % — AB (ref 4.6–6.5)

## 2017-04-01 MED ORDER — INSULIN LISPRO 100 UNIT/ML (KWIKPEN): 30.0000 [IU] | PEN_INJECTOR | Freq: Three times a day (TID) | SUBCUTANEOUS | 4 refills | Status: DC

## 2017-04-01 MED ORDER — INSULIN PEN NEEDLE 33G X 5 MM MISC: 1.0000 "application " | Freq: Four times a day (QID) | 3 refills | Status: DC | PRN

## 2017-04-01 MED ORDER — DULOXETINE HCL 60 MG PO CPEP: 60.0000 mg | ORAL_CAPSULE | Freq: Every day | ORAL | 2 refills | Status: DC

## 2017-04-01 MED ORDER — LISINOPRIL 5 MG PO TABS: 5.0000 mg | ORAL_TABLET | Freq: Every day | ORAL | 2 refills | Status: DC

## 2017-04-01 MED ORDER — METOPROLOL SUCCINATE ER 100 MG PO TB24: 100.0000 mg | ORAL_TABLET | Freq: Every day | ORAL | 2 refills | Status: DC

## 2017-04-01 MED ORDER — SERTRALINE HCL 50 MG PO TABS: 50.0000 mg | ORAL_TABLET | Freq: Every day | ORAL | 1 refills | Status: DC

## 2017-04-01 MED ORDER — ATORVASTATIN CALCIUM 80 MG PO TABS: 80.0000 mg | ORAL_TABLET | Freq: Every day | ORAL | 2 refills | Status: DC

## 2017-04-01 MED ORDER — CLOPIDOGREL BISULFATE 75 MG PO TABS: 75.0000 mg | ORAL_TABLET | Freq: Every day | ORAL | 2 refills | Status: DC

## 2017-04-01 MED ORDER — DIVALPROEX SODIUM ER 500 MG PO TB24: 1000.0000 mg | ORAL_TABLET | Freq: Every morning | ORAL | 1 refills | Status: DC

## 2017-04-01 NOTE — Progress Notes (Signed)
Subjective:    Patient ID: Jose Merritt, male    DOB: 03-29-1976, 41 y.o.   MRN: [id number]  CC: Jose Merritt is a 41 y.o. male who presents today for follow up.   HPI: DM- follows with Dr Tedd Sias. Missed last appointment. Requesting refill for insulin pen as Medtronic CGM is out of supplies ; currently- having issue with insurance covering  Bipolar- Had seen Dr Maryruth Bun; had been depakote, zoloft, cymbalta- which was 'best he had felt' . Only on cymbalta only now since Dr Maryruth Bun advised she would not refill medications if he was not in counseling. He does not plan on seeing her again.  If about to have an episode, will get more irritable. Not feeling irritable today and 'feels well'.No current thoughts of hurting himself or anyone else. Had plan for suicide with plan for shooting himself 2 months ago. He is not having those thoughts today. Cares for his wife and daughter very much.   He also complains of a long history of trouble sleeping. He tried [place] in the past and did not like this medication.He would like to try trazodone again.  CHF- [place]; Reviewed cardiology note 9/11; no medication changes made. Denies orthopnea, weight gain, exertional chest pain or pressure, numbness or tingling radiating to left arm or jaw, palpitations, dizziness, frequent headaches, changes in vision, or shortness of breath.      HISTORY:  Past Medical History:  Diagnosis Date  . Asthma   . Bipolar disorder (HCC)   . CHF (congestive heart failure) (HCC)   . Chicken pox   . Clotting disorder (HCC)    lung  . Depression   . Diabetes mellitus without complication (HCC) 2003   Type 2  . Heart murmur    as child  . Hyperlipidemia   . Hypertension   . Myocardial infarction (HCC) 06/2015  . Retinopathy of both eyes 01/2016  . SOB (shortness of breath) 08/17/2016  . Stroke [facility])    Past Surgical History:  Procedure Laterality Date  . CORONARY ANGIOPLASTY WITH STENT PLACEMENT Left  06/2015   LADx1 stent  . WISDOM TOOTH EXTRACTION     Family History  Problem Relation Age of Onset  . Hypertension Mother   . Arthritis Mother   . Heart disease Father        3 heart attacks  . Diabetes Father   . Hypertension Father   . Hyperlipidemia Father   . Alcohol abuse Father   . Hypertension Brother   . Hyperlipidemia Brother   . Heart disease Maternal Uncle   . Heart disease Paternal Aunt   . Heart disease Maternal Grandfather   . Hyperlipidemia Maternal Grandfather   . Diabetes Maternal Grandfather   . Kidney disease Maternal Grandfather   . Heart disease Paternal Grandmother   . Kidney disease Paternal Grandmother   . Hypertension Maternal Grandmother   . Kidney disease Maternal Grandmother   . Arthritis Maternal Grandmother   . Kidney disease Paternal Grandfather     Allergies: Metrizamide; Other; Sulfa antibiotics; and Contrast media [iodinated diagnostic agents] Current Outpatient Prescriptions on File Prior to Visit  Medication Sig Dispense Refill  . aspirin 81 MG chewable tablet Chew by mouth daily.    . insulin lispro (HUMALOG) 100 UNIT/ML injection Inject 1 mL (100 Units total) into the skin continuous. Medtronic 670G basal rate 4.0units/hour, I:C ratio 3.5gm/1 unit, goal 10pm-9am= 150mg /dl, [id number]$ /dl,  correction factor 1 HCWCB=76 points.  Also uses CGMS.  Continuous pump  user since October 2017 10 vial 2  . nitroGLYCERIN (NITROSTAT) 0.3 MG SL tablet Place 1 tablet (0.3 mg total) under the tongue every 5 (five) minutes as needed. 90 tablet 0  . spironolactone (ALDACTONE) 25 MG tablet Take 0.5 tablets (12.5 mg total) by mouth daily. qam 45 tablet 2   No current facility-administered medications on file prior to visit.     Social History  Substance Use Topics  . Smoking status: Never Smoker  . Smokeless tobacco: Never Used  . Alcohol use No     Comment: rare- 2x/year    Review of Systems  Constitutional: Negative for chills and fever.    Respiratory: Negative for cough.   Cardiovascular: Negative for chest pain and palpitations.  Gastrointestinal: Negative for nausea and vomiting.  Psychiatric/Behavioral: Positive for sleep disturbance and suicidal ideas. Negative for agitation. The patient is not nervous/anxious.       Objective:    BP 117/80   Pulse 66   Temp 98 F (36.7 C) (Oral)   Ht 6' (1.829 m)   Wt 234 lb (106.1 kg)   SpO2 97%   BMI 31.74 kg/m  BP Readings from Last 3 Encounters:  04/01/17 117/80  02/09/17 111/76  02/03/17 117/80   Wt Readings from Last 3 Encounters:  04/01/17 234 lb (106.1 kg)  02/09/17 241 lb 8 oz (109.5 kg)  02/03/17 246 lb 14.4 oz (112 kg)    Physical Exam  Constitutional: He appears well-developed and well-nourished.  Cardiovascular: Regular rhythm and normal heart sounds.   Pulmonary/Chest: Effort normal and breath sounds normal. No respiratory distress. He has no wheezes. He has no rhonchi. He has no rales.  Neurological: He is alert.  Skin: Skin is warm and dry.  Psychiatric: He has a normal mood and affect. His speech is normal and behavior is normal.  Calm, appropriately dressed.  Vitals reviewed.      Assessment & Plan:   Problem List Items Addressed This Visit      Cardiovascular and Mediastinum   Type 2 diabetes mellitus with circulatory disorder (HCC)    Follows with [facility]. Pending a1c. Ordered lispro pen and needles as requested however he understands further management/titration needs to come from endocrine.       Relevant Medications   atorvastatin (LIPITOR) 80 MG tablet   lisinopril (PRINIVIL,ZESTRIL) 5 MG tablet   metoprolol succinate (TOPROL-XL) 100 MG 24 hr tablet   insulin lispro (HUMALOG) 100 UNIT/ML KiwkPen   Insulin Pen Needle 33G X 5 MM MISC   Other Relevant Orders   Hemoglobin A1c   Chronic combined systolic and diastolic CHF (congestive heart failure) (HCC)    Clinically asymptomatic. No evidence of fluid volume overload today. Continues  with Dr. Mariah Millinggollan      Relevant Medications   atorvastatin (LIPITOR) 80 MG tablet   lisinopril (PRINIVIL,ZESTRIL) 5 MG tablet   metoprolol succinate (TOPROL-XL) 100 MG 24 hr tablet     Other   Bipolar disorder, in partial remission, most recent episode depressed (HCC)    Deeply concerned with patient's suidice ideation and suicide plan 2 months ago. Discussed this at great length with patient. He is adamantly that he does not continue to have these thoughts today. My  concern is if the patient remains off medications, particularly mood stabilizing agent, that he may become manic and attempt suicide. Long discussion with telling someone if he SI again which he agreed. Suicide contract in place. I advised that I do not feel comfortable managing  bipolar and strongly advised for him to re establish with another psychiatrist. He is somewhat resistant to this as feels uncomfortable seeing psychiatry. Ultimately he has agreed to go at least once to [facility]. Information given.Medicaiton regimen raises concern for serotonin syndrome which I have advised patient. He feels well on current regimen and out of concern for patient harming himself, I feel appropriate to refill and continue. My hope is he establishes with psychiatry however if he doesn't, I feel compelled to continue his regimen. For sleep disturbance, Advised to trial taking 100mg  zoloft at bedtime to see if helps with sleep. Advised one month f/u with colleague ( since on maternity) and patient refused to see anyone but myself. Will follow.       Relevant Medications   divalproex (DEPAKOTE ER) 500 MG 24 hr tablet   DULoxetine (CYMBALTA) 60 MG capsule   sertraline (ZOLOFT) 50 MG tablet   History of CVA (cerebrovascular accident)   Relevant Medications   atorvastatin (LIPITOR) 80 MG tablet   clopidogrel (PLAVIX) 75 MG tablet   Other Relevant Orders   Lipid panel   History of MI (myocardial infarction)   Relevant Medications   lisinopril  (PRINIVIL,ZESTRIL) 5 MG tablet   metoprolol succinate (TOPROL-XL) 100 MG 24 hr tablet   Other Relevant Orders   Comprehensive metabolic panel    Other Visit Diagnoses    Bipolar disorder in partial remission, most recent episode unspecified type (HCC)    -  Primary   Relevant Medications   divalproex (DEPAKOTE ER) 500 MG 24 hr tablet   DULoxetine (CYMBALTA) 60 MG capsule   sertraline (ZOLOFT) 50 MG tablet       I have changed Mr. Primiano's divalproex and sertraline. I am also having him start on insulin lispro and Insulin Pen Needle. Additionally, I am having him maintain his aspirin, spironolactone, nitroGLYCERIN, insulin lispro, atorvastatin, clopidogrel, DULoxetine, lisinopril, and metoprolol succinate.   Meds ordered this encounter  Medications  . atorvastatin (LIPITOR) 80 MG tablet    Sig: Take 1 tablet (80 mg total) by mouth daily. qam    Dispense:  90 tablet    Refill:  2  . clopidogrel (PLAVIX) 75 MG tablet    Sig: Take 1 tablet (75 mg total) by mouth daily. qam    Dispense:  90 tablet    Refill:  2  . divalproex (DEPAKOTE ER) 500 MG 24 hr tablet    Sig: Take 2 tablets (1,000 mg total) by mouth every morning.    Dispense:  180 tablet    Refill:  1  . DULoxetine (CYMBALTA) 60 MG capsule    Sig: Take 1 capsule (60 mg total) by mouth daily. qam    Dispense:  90 capsule    Refill:  2  . lisinopril (PRINIVIL,ZESTRIL) 5 MG tablet    Sig: Take 1 tablet (5 mg total) by mouth daily. qam    Dispense:  90 tablet    Refill:  2  . metoprolol succinate (TOPROL-XL) 100 MG 24 hr tablet    Sig: Take 1 tablet (100 mg total) by mouth daily. Take with or immediately following a meal. qam    Dispense:  90 tablet    Refill:  2  . sertraline (ZOLOFT) 50 MG tablet    Sig: Take 1 tablet (50 mg total) by mouth daily.    Dispense:  90 tablet    Refill:  1  . insulin lispro (HUMALOG) 100 UNIT/ML KiwkPen    Sig: Inject 0.3  mLs (30 Units total) into the skin 3 (three) times daily with  meals.    Dispense:  15 mL    Refill:  4  . Insulin Pen Needle 33G X 5 MM MISC    Sig: 1 application by Does not apply route 4 (four) times daily as needed.    Dispense:  100 each    Refill:  3    Return precautions given.   Risks, benefits, and alternatives of the medications and treatment plan prescribed today were discussed, and patient expressed understanding.   Education regarding symptom management and diagnosis given to patient on AVS.  Continue to follow with Allegra Grana, FNP for routine health maintenance.   Dixie Dials and I agreed with plan.   [name], FNP

## 2017-04-01 NOTE — Patient Instructions (Signed)
Medications refilled  Please keep me posted on how you are feeling.   I have refilled your medications as most importantly I want your mood to be stable.   I would like for you to go to [facility] services anytime between 8am-3pm.  They have counseling and psychiatrists there who can see you right away.  They have walk ins on Monday, Wednesdays, and Fridays.  The address is [address] [place], [place] [address].  Phone number is [phone].   They are near [facility] and [facility].   Follow up one month with colleague to ensure doing okay.

## 2017-04-01 NOTE — Assessment & Plan Note (Signed)
Follows with [facility]. Pending a1c. Ordered lispro pen and needles as requested however he understands further management/titration needs to come from endocrine.

## 2017-04-01 NOTE — Assessment & Plan Note (Addendum)
Deeply concerned with patient's suidice ideation and suicide plan 2 months ago. Discussed this at great length with patient. He is adamantly that he does not continue to have these thoughts today. My  concern is if the patient remains off medications, particularly mood stabilizing agent, that he may become manic and attempt suicide. Long discussion with telling someone if he SI again which he agreed. Suicide contract in place. I advised that I do not feel comfortable managing bipolar and strongly advised for him to re establish with another psychiatrist. He is somewhat resistant to this as feels uncomfortable seeing psychiatry. Ultimately he has agreed to go at least once to [facility]. Information given.Medicaiton regimen raises concern for serotonin syndrome which I have advised patient. He feels well on current regimen and out of concern for patient harming himself, I feel appropriate to refill and continue. My hope is he establishes with psychiatry however if he doesn't, I feel compelled to continue his regimen. For sleep disturbance, Advised to trial taking 100mg  zoloft at bedtime to see if helps with sleep. Advised one month f/u with colleague ( since on maternity) and patient refused to see anyone but myself. Will follow.

## 2017-04-01 NOTE — Progress Notes (Signed)
Pre visit review using our clinic review tool, if applicable. No additional management support is needed unless otherwise documented below in the visit note. 

## 2017-04-01 NOTE — Assessment & Plan Note (Signed)
Clinically asymptomatic. No evidence of fluid volume overload today. Continues with Dr. Mariah Milling

## 2017-04-07 ENCOUNTER — Ambulatory Visit: Payer: Self-pay

## 2017-04-13 ENCOUNTER — Telehealth: Payer: Self-pay | Admitting: Family

## 2017-04-13 NOTE — Telephone Encounter (Signed)
Call pt  We saw him a couple of weeks and I wanted to check in to see how he was doing on his current medication regimen.   How is he on the zoloft, cymbalta, depakote?   Is his depression stable?   Is he having any unusual thoughts or thoughts of harming himself?  I want to ensure she is safe.   I understand he didn't want to see any one until I was back from maternity leave- please emphasize the importance of seeing a colleague of  Mine if he has any concerns.

## 2017-04-14 ENCOUNTER — Ambulatory Visit: Payer: Self-pay

## 2017-04-14 NOTE — Telephone Encounter (Signed)
Left message for patient to return call back.  

## 2017-04-14 NOTE — Telephone Encounter (Signed)
When you speak with him- let him know we have an awesome new therapist here , a male, Delight Ovens, that I would love for him to meet with.   If he is open to it- we can set him up fast.   If im not here, please ask Efraim Kaufmann how to place referral  I gave Camila Li a heads up.

## 2017-04-16 ENCOUNTER — Other Ambulatory Visit: Payer: Self-pay

## 2017-04-16 NOTE — Patient Outreach (Signed)
[facility]) Care Management  04/16/2017  Jose Merritt 09/11/1975 [id number]  Jose Merritt on Wednesday and he was scheduled to come and see me; I relayed the news about the transition of [name] to [employer] [facility] [employer].  Jose Merritt's email comment was;   The hits just keep coming. I've basically given up anyhow. My sugars are terrible. I cant afford the supplies. Troubles at home. My shrink dumped me. You name it. Now I'm losing you, the one person that helps me stay focused.         I'm not coming today. I appreciate everything you'd done. Your amazing Jose Merritt.   My response was; You will have support through the new program.  If you can get on top of the Medtronic bill, the costs of the medications should be covered through the plan.  If you don't want to put the money out for the pump supplies, we can manage the disease with Lantus and Novolog; it may be not quite as successful but it will keep you out of trouble and prevent long term complications.     Jose Racer, RN, BA, MHA, CDE [facility] Diabetes Coordinator- [name] Dial:  [phone]  Fax:  [phone] E-mail: [email or web address] [address] [place], [place]  [address]

## 2017-04-19 ENCOUNTER — Telehealth: Payer: Self-pay

## 2017-04-19 NOTE — Telephone Encounter (Signed)
Copied from CRM [id number]. Topic: Inquiry >> Apr 19, 2017 12:24 PM Guinevere Ferrari, NT wrote: Reason for CRM: Pt. Is returning a call from  but no CRM noted. Pt. Would like a call back

## 2017-04-21 NOTE — Telephone Encounter (Signed)
Left message for patient to return call back.  

## 2017-04-28 NOTE — Telephone Encounter (Signed)
Closing note due to patient not returning call. 

## 2017-04-30 ENCOUNTER — Emergency Department: Payer: 59

## 2017-04-30 ENCOUNTER — Encounter: Payer: Self-pay | Admitting: Emergency Medicine

## 2017-04-30 ENCOUNTER — Observation Stay
Admission: EM | Admit: 2017-04-30 | Discharge: 2017-05-01 | Disposition: A | Discharge status: Home / Self Care | Payer: 59 | Attending: Internal Medicine | Admitting: Internal Medicine

## 2017-04-30 DIAGNOSIS — Z7982 Long term (current) use of aspirin: Secondary | ICD-10-CM | POA: Diagnosis not present

## 2017-04-30 DIAGNOSIS — F319 Bipolar disorder, unspecified: Secondary | ICD-10-CM | POA: Diagnosis not present

## 2017-04-30 DIAGNOSIS — Z79899 Other long term (current) drug therapy: Secondary | ICD-10-CM | POA: Diagnosis not present

## 2017-04-30 DIAGNOSIS — Z9889 Other specified postprocedural states: Secondary | ICD-10-CM | POA: Insufficient documentation

## 2017-04-30 DIAGNOSIS — Z7902 Long term (current) use of antithrombotics/antiplatelets: Secondary | ICD-10-CM | POA: Diagnosis not present

## 2017-04-30 DIAGNOSIS — E1159 Type 2 diabetes mellitus with other circulatory complications: Secondary | ICD-10-CM | POA: Diagnosis present

## 2017-04-30 DIAGNOSIS — E11319 Type 2 diabetes mellitus with unspecified diabetic retinopathy without macular edema: Secondary | ICD-10-CM | POA: Insufficient documentation

## 2017-04-30 DIAGNOSIS — Z87892 Personal history of anaphylaxis: Secondary | ICD-10-CM | POA: Diagnosis not present

## 2017-04-30 DIAGNOSIS — Z794 Long term (current) use of insulin: Secondary | ICD-10-CM | POA: Insufficient documentation

## 2017-04-30 DIAGNOSIS — Z888 Allergy status to other drugs, medicaments and biological substances status: Secondary | ICD-10-CM | POA: Diagnosis not present

## 2017-04-30 DIAGNOSIS — I252 Old myocardial infarction: Secondary | ICD-10-CM | POA: Insufficient documentation

## 2017-04-30 DIAGNOSIS — R4701 Aphasia: Principal | ICD-10-CM | POA: Diagnosis present

## 2017-04-30 DIAGNOSIS — E785 Hyperlipidemia, unspecified: Secondary | ICD-10-CM | POA: Diagnosis not present

## 2017-04-30 DIAGNOSIS — I110 Hypertensive heart disease with heart failure: Secondary | ICD-10-CM | POA: Diagnosis not present

## 2017-04-30 DIAGNOSIS — I5042 Chronic combined systolic (congestive) and diastolic (congestive) heart failure: Secondary | ICD-10-CM | POA: Diagnosis present

## 2017-04-30 DIAGNOSIS — I429 Cardiomyopathy, unspecified: Secondary | ICD-10-CM | POA: Diagnosis not present

## 2017-04-30 DIAGNOSIS — I25118 Atherosclerotic heart disease of native coronary artery with other forms of angina pectoris: Secondary | ICD-10-CM | POA: Diagnosis not present

## 2017-04-30 DIAGNOSIS — Z882 Allergy status to sulfonamides status: Secondary | ICD-10-CM | POA: Diagnosis not present

## 2017-04-30 DIAGNOSIS — Z955 Presence of coronary angioplasty implant and graft: Secondary | ICD-10-CM | POA: Diagnosis not present

## 2017-04-30 DIAGNOSIS — I10 Essential (primary) hypertension: Secondary | ICD-10-CM | POA: Diagnosis present

## 2017-04-30 DIAGNOSIS — I2510 Atherosclerotic heart disease of native coronary artery without angina pectoris: Secondary | ICD-10-CM | POA: Diagnosis not present

## 2017-04-30 DIAGNOSIS — R29818 Other symptoms and signs involving the nervous system: Secondary | ICD-10-CM | POA: Diagnosis not present

## 2017-04-30 DIAGNOSIS — Z91013 Allergy to seafood: Secondary | ICD-10-CM | POA: Diagnosis not present

## 2017-04-30 DIAGNOSIS — E119 Type 2 diabetes mellitus without complications: Secondary | ICD-10-CM | POA: Diagnosis not present

## 2017-04-30 DIAGNOSIS — G459 Transient cerebral ischemic attack, unspecified: Secondary | ICD-10-CM | POA: Diagnosis not present

## 2017-04-30 DIAGNOSIS — R471 Dysarthria and anarthria: Secondary | ICD-10-CM

## 2017-04-30 DIAGNOSIS — Z91041 Radiographic dye allergy status: Secondary | ICD-10-CM | POA: Insufficient documentation

## 2017-04-30 DIAGNOSIS — Z86711 Personal history of pulmonary embolism: Secondary | ICD-10-CM | POA: Insufficient documentation

## 2017-04-30 DIAGNOSIS — Z8673 Personal history of transient ischemic attack (TIA), and cerebral infarction without residual deficits: Secondary | ICD-10-CM | POA: Diagnosis not present

## 2017-04-30 DIAGNOSIS — Z9641 Presence of insulin pump (external) (internal): Secondary | ICD-10-CM | POA: Insufficient documentation

## 2017-04-30 DIAGNOSIS — E782 Mixed hyperlipidemia: Secondary | ICD-10-CM | POA: Diagnosis present

## 2017-04-30 DIAGNOSIS — Z8619 Personal history of other infectious and parasitic diseases: Secondary | ICD-10-CM | POA: Insufficient documentation

## 2017-04-30 LAB — CBC
HEMATOCRIT: 45.3 % (ref 40.0–52.0)
HEMOGLOBIN: 15.4 g/dL (ref 13.0–18.0)
MCH: 30.1 pg (ref 26.0–34.0)
MCHC: 34.1 g/dL (ref 32.0–36.0)
MCV: 88.4 fL (ref 80.0–100.0)
Platelets: 205 10*3/uL (ref 150–440)
RBC: 5.13 MIL/uL (ref 4.40–5.90)
RDW: 13.9 % (ref 11.5–14.5)
WBC: 5.8 10*3/uL (ref 3.8–10.6)

## 2017-04-30 LAB — DIFFERENTIAL
BASOS PCT: 1 %
Basophils Absolute: 0 10*3/uL (ref 0–0.1)
EOS ABS: 0.1 10*3/uL (ref 0–0.7)
Eosinophils Relative: 2 %
Lymphocytes Relative: 30 %
Lymphs Abs: 1.8 10*3/uL (ref 1.0–3.6)
MONO ABS: 0.6 10*3/uL (ref 0.2–1.0)
Monocytes Relative: 11 %
NEUTROS PCT: 56 %
Neutro Abs: 3.3 10*3/uL (ref 1.4–6.5)

## 2017-04-30 LAB — COMPREHENSIVE METABOLIC PANEL
ALT: 30 U/L (ref 17–63)
AST: 16 U/L (ref 15–41)
Albumin: 4.1 g/dL (ref 3.5–5.0)
Alkaline Phosphatase: 137 U/L — ABNORMAL HIGH (ref 38–126)
Anion gap: 9 (ref 5–15)
BUN: 15 mg/dL (ref 6–20)
CHLORIDE: 100 mmol/L — AB (ref 101–111)
CO2: 24 mmol/L (ref 22–32)
CREATININE: 0.74 mg/dL (ref 0.61–1.24)
Calcium: 9.3 mg/dL (ref 8.9–10.3)
GFR calc non Af Amer: >60 mL/min (ref >60)
Glucose, Bld: 416 mg/dL — ABNORMAL HIGH (ref 65–99)
POTASSIUM: 3.7 mmol/L (ref 3.5–5.1)
SODIUM: 133 mmol/L — AB (ref 135–145)
Total Bilirubin: 0.8 mg/dL (ref 0.3–1.2)
Total Protein: 7.4 g/dL (ref 6.5–8.1)

## 2017-04-30 LAB — PROTIME-INR
INR: 0.98
Prothrombin Time: 12.9 seconds (ref 11.4–15.2)

## 2017-04-30 LAB — APTT: aPTT: 25 seconds (ref 24–36)

## 2017-04-30 LAB — TROPONIN I

## 2017-04-30 MED ORDER — DIPHENHYDRAMINE HCL 50 MG/ML IJ SOLN
12.5000 mg | Freq: Once | INTRAMUSCULAR | Status: AC
  Administered 2017-04-30: 12.5 mg via INTRAVENOUS
  Filled 2017-04-30: qty 1

## 2017-04-30 MED ORDER — PROCHLORPERAZINE EDISYLATE 5 MG/ML IJ SOLN
10.0000 mg | Freq: Once | INTRAMUSCULAR | Status: AC
  Administered 2017-04-30: 10 mg via INTRAVENOUS
  Filled 2017-04-30: qty 2

## 2017-04-30 MED ORDER — TETRACAINE HCL 0.5 % OP SOLN
2.0000 [drp] | Freq: Once | OPHTHALMIC | Status: AC
  Administered 2017-04-30: 2 [drp] via OPHTHALMIC

## 2017-04-30 MED ORDER — SODIUM CHLORIDE 0.9 % IV BOLUS (SEPSIS)
500.0000 mL | Freq: Once | INTRAVENOUS | Status: AC
  Administered 2017-04-30: 500 mL via INTRAVENOUS

## 2017-04-30 MED ORDER — TETRACAINE HCL 0.5 % OP SOLN
OPHTHALMIC | Status: AC
  Administered 2017-04-30: 2 [drp] via OPHTHALMIC
  Filled 2017-04-30: qty 4

## 2017-04-30 NOTE — ED Provider Notes (Signed)
[facility] Emergency Department Provider Note    First MD Initiated Contact with Patient 04/30/17 2038     (approximate)  I have reviewed the triage vital signs and the nursing notes.   HISTORY  Chief Complaint Code Stroke    HPI Jose Merritt is a 41 y.o. male with below listed medical complaints as well as a history of TIA presents with chief complaint of initially having difficulty finding words while the patient was at ice cream store with his family earlier today.  States that I could not find the words to ask his daughter what I screen that she wanted.  Subsequently was finally able to get some words out but none of it made sense according to family.  He started feeling "funny" and the symptoms are lasting several minutes followed by development of right sided arm weakness.  Patient was brought to the ER and symptoms seem to have resolved.  He is currently on aspirin and Plavix.  Denies any chest pain palpitations or shortness of breath.  Does have a mild headache behind his left eye.  Past Medical History:  Diagnosis Date  . Asthma   . Bipolar disorder (HCC)   . CHF (congestive heart failure) (HCC)   . Chicken pox   . Clotting disorder (HCC)    lung  . Depression   . Diabetes mellitus without complication (HCC) 2003   Type 2  . Heart murmur    as child  . Hyperlipidemia   . Hypertension   . Myocardial infarction (HCC) 06/2015  . Retinopathy of both eyes 01/2016  . SOB (shortness of breath) 08/17/2016  . Stroke [facility](HCC)    Family History  Problem Relation Age of Onset  . Hypertension Mother   . Arthritis Mother   . Heart disease Father        3 heart attacks  . Diabetes Father   . Hypertension Father   . Hyperlipidemia Father   . Alcohol abuse Father   . Hypertension Brother   . Hyperlipidemia Brother   . Heart disease Maternal Uncle   . Heart disease Paternal Aunt   . Heart disease Maternal Grandfather   . Hyperlipidemia  Maternal Grandfather   . Diabetes Maternal Grandfather   . Kidney disease Maternal Grandfather   . Heart disease Paternal Grandmother   . Kidney disease Paternal Grandmother   . Hypertension Maternal Grandmother   . Kidney disease Maternal Grandmother   . Arthritis Maternal Grandmother   . Kidney disease Paternal Grandfather    Past Surgical History:  Procedure Laterality Date  . CORONARY ANGIOPLASTY WITH STENT PLACEMENT Left 06/2015   LADx1 stent  . WISDOM TOOTH EXTRACTION     Patient Active Problem List   Diagnosis Date Noted  . Aphasia 04/30/2017  . Coronary artery disease of native artery of native heart with stable angina pectoris (HCC) 02/07/2017  . Type 2 diabetes mellitus with circulatory disorder (HCC) 02/07/2017  . Chronic combined systolic and diastolic CHF (congestive heart failure) (HCC) 02/07/2017  . Heart failure (HCC) 11/18/2016  . Bipolar disorder, in partial remission, most recent episode depressed (HCC) 09/30/2016  . HTN (hypertension) 09/30/2016  . HLD (hyperlipidemia) 09/30/2016  . History of CVA (cerebrovascular accident) 09/30/2016  . History of MI (myocardial infarction) 09/30/2016      Prior to Admission medications   Medication Sig Start Date End Date Taking? Authorizing Provider  aspirin 81 MG chewable tablet Chew by mouth daily.   Yes [provider]  atorvastatin (LIPITOR) 80 MG tablet Take 1 tablet (80 mg total) by mouth daily. qam 04/01/17  Yes Allegra Grana, FNP  clopidogrel (PLAVIX) 75 MG tablet Take 1 tablet (75 mg total) by mouth daily. qam 04/01/17  Yes Allegra Grana, FNP  divalproex (DEPAKOTE ER) 500 MG 24 hr tablet Take 2 tablets (1,000 mg total) by mouth every morning. 04/01/17  Yes Allegra Grana, FNP  DULoxetine (CYMBALTA) 60 MG capsule Take 1 capsule (60 mg total) by mouth daily. qam 04/01/17  Yes Arnett, Lyn Records, FNP  insulin lispro (HUMALOG) 100 UNIT/ML KiwkPen Inject 0.3 mLs (30 Units total) into the skin 3  (three) times daily with meals. 04/01/17  Yes Arnett, Lyn Records, FNP  lisinopril (PRINIVIL,ZESTRIL) 5 MG tablet Take 1 tablet (5 mg total) by mouth daily. qam 04/01/17  Yes Allegra Grana, FNP  metoprolol succinate (TOPROL-XL) 100 MG 24 hr tablet Take 1 tablet (100 mg total) by mouth daily. Take with or immediately following a meal. qam 04/01/17  Yes Arnett, Lyn Records, FNP  nitroGLYCERIN (NITROSTAT) 0.3 MG SL tablet Place 1 tablet (0.3 mg total) under the tongue every 5 (five) minutes as needed. 11/18/16  Yes Allegra Grana, FNP  sertraline (ZOLOFT) 50 MG tablet Take 1 tablet (50 mg total) by mouth daily. 04/01/17  Yes Arnett, Lyn Records, FNP  insulin lispro (HUMALOG) 100 UNIT/ML injection Inject 1 mL (100 Units total) into the skin continuous. Medtronic 670G basal rate 4.0units/hour, I:C ratio 3.5gm/1 unit, goal 10pm-9am= 150mg /dl, 9am-10pm=100mg /dl,  correction factor 1 2BM-21JD=$[id number]_LPNPYYFRTMYTRZNBVAPOLIDCVUDTHYHO$$OILNZVJKQASUORVI_FBPPHKFEXMDYJWLKHVFMBBUYZJQDUKRC$ points.  Also uses CGMS.  Continuous pump user since October 2017 11/25/16   11/27/16, MD  Insulin Pen Needle 33G X 5 MM MISC 1 application by Does not apply route 4 (four) times daily as needed. 04/01/17   13/1/18, FNP  spironolactone (ALDACTONE) 25 MG tablet Take 0.5 tablets (12.5 mg total) by mouth daily. qam 11/18/16   11/20/16, FNP    Allergies Metrizamide; Other; Sulfa antibiotics; and Contrast media [iodinated diagnostic agents]    Social History Social History   Tobacco Use  . Smoking status: Never Smoker  . Smokeless tobacco: Never Used  Substance Use Topics  . Alcohol use: No    Comment: rare- 2x/year  . Drug use: No    Review of Systems Patient denies headaches, rhinorrhea, blurry vision, numbness, shortness of breath, chest pain, edema, cough, abdominal pain, nausea, vomiting, diarrhea, dysuria, fevers, rashes or hallucinations unless otherwise stated above in HPI. ____________________________________________   PHYSICAL EXAM:  VITAL SIGNS: Vitals:   04/30/17 2230  04/30/17 2330  BP: 119/74 123/81  Pulse: 83 80  Resp: 19 18  Temp:    SpO2: 93% 96%    Constitutional: Alert and oriented. Well appearing and in no acute distress. Eyes: Conjunctivae are normal. Right pupil 29mm, left 37mm and reactive.  iop left 14, right 13 Head: Atraumatic. Nose: No congestion/rhinnorhea. Mouth/Throat: Mucous membranes are moist.   Neck: No stridor. Painless ROM.  Cardiovascular: Normal rate, regular rhythm. Grossly normal heart sounds.  Good peripheral circulation. Respiratory: Normal respiratory effort.  No retractions. Lungs CTAB. Gastrointestinal: Soft and nontender. No distention. No abdominal bruits. No CVA tenderness. Genitourinary:  Musculoskeletal: No lower extremity tenderness nor edema.  No joint effusions. Neurologic: CN- intact.  No facial droop, Normal FNF.  Normal heel to shin.  Sensation intact bilaterally. Normal speech and language. No gross focal neurologic deficits are appreciated. No gait instability. Skin:  Skin is  warm, dry and intact. No rash noted. Psychiatric: Mood and affect are normal. Speech and behavior are normal.  ____________________________________________   LABS (all labs ordered are listed, but only abnormal results are displayed)  Results for orders placed or performed during the hospital encounter of 04/30/17 (from the past 24 hour(s))  Protime-INR     Status: None   Collection Time: 04/30/17  8:02 PM  Result Value Ref Range   Prothrombin Time 12.9 11.4 - 15.2 seconds   INR 0.98   APTT     Status: None   Collection Time: 04/30/17  8:02 PM  Result Value Ref Range   aPTT 25 24 - 36 seconds  CBC     Status: None   Collection Time: 04/30/17  8:02 PM  Result Value Ref Range   WBC 5.8 3.8 - 10.6 K/uL   RBC 5.13 4.40 - 5.90 MIL/uL   Hemoglobin 15.4 13.0 - 18.0 g/dL   HCT 16.1 09.6 - 04.5 %   MCV 88.4 80.0 - 100.0 fL   MCH 30.1 26.0 - 34.0 pg   MCHC 34.1 32.0 - 36.0 g/dL   RDW 40.9 81.1 - 91.4 %   Platelets 205 150 - 440  K/uL  Differential     Status: None   Collection Time: 04/30/17  8:02 PM  Result Value Ref Range   Neutrophils Relative % 56 %   Neutro Abs 3.3 1.4 - 6.5 K/uL   Lymphocytes Relative 30 %   Lymphs Abs 1.8 1.0 - 3.6 K/uL   Monocytes Relative 11 %   Monocytes Absolute 0.6 0.2 - 1.0 K/uL   Eosinophils Relative 2 %   Eosinophils Absolute 0.1 0 - 0.7 K/uL   Basophils Relative 1 %   Basophils Absolute 0.0 0 - 0.1 K/uL  Comprehensive metabolic panel     Status: Abnormal   Collection Time: 04/30/17  8:02 PM  Result Value Ref Range   Sodium 133 (L) 135 - 145 mmol/L   Potassium 3.7 3.5 - 5.1 mmol/L   Chloride 100 (L) 101 - 111 mmol/L   CO2 24 22 - 32 mmol/L   Glucose, Bld 416 (H) 65 - 99 mg/dL   BUN 15 6 - 20 mg/dL   Creatinine, Ser 7.82 0.61 - 1.24 mg/dL   Calcium 9.3 8.9 - 95.6 mg/dL   Total Protein 7.4 6.5 - 8.1 g/dL   Albumin 4.1 3.5 - 5.0 g/dL   AST 16 15 - 41 U/L   ALT 30 17 - 63 U/L   Alkaline Phosphatase 137 (H) 38 - 126 U/L   Total Bilirubin 0.8 0.3 - 1.2 mg/dL   GFR calc non Af Amer >60 >60 mL/min   GFR calc Af Amer >60 >60 mL/min   Anion gap 9 5 - 15  Troponin I     Status: None   Collection Time: 04/30/17  8:02 PM  Result Value Ref Range   Troponin I <0.03 <0.03 ng/mL   ____________________________________________  EKG My review and personal interpretation at Time: 20:04   Indication: tia  Rate: 90  Rhythm: sinus Axis: normal Other: poor r wave progression, no stemi, normal intervals ____________________________________________  RADIOLOGY  I personally reviewed all radiographic images ordered to evaluate for the above acute complaints and reviewed radiology reports and findings.  These findings were personally discussed with the patient.  Please see medical record for radiology report.  ____________________________________________   PROCEDURES  Procedure(s) performed:  Procedures    Critical Care performed:  no  ____________________________________________   INITIAL IMPRESSION / ASSESSMENT AND PLAN / ED COURSE  Pertinent labs & imaging results that were available during my care of the patient were reviewed by me and considered in my medical decision making (see chart for details).  DDX: cva, tia, hypoglycemia, dehydration, electrolyte abnormality, dissection, sepsis   Jose Merritt is a 41 y.o. who presents to the ED with was as described above.  Patient made a code stroke based on his presentation is divided by neurology.  Symptoms seem to have improved therefore not a TPA candidate.  This not clinically consistent with glaucoma.  CT head shows no acute abnormalities.  Blood work is reassuring.  Spoke with Dr. Anne Hahn of hospitalist group who kindly agrees to admit patient for further medical management.      ____________________________________________   FINAL CLINICAL IMPRESSION(S) / ED DIAGNOSES  Final diagnoses:  TIA (transient ischemic attack)  Aphasia  Dysarthria      NEW MEDICATIONS STARTED DURING THIS VISIT:  This SmartLink is deprecated. Use AVSMEDLIST instead to display the medication list for a patient.   Note:  This document was prepared using Dragon voice recognition software and may include unintentional dictation errors.    Willy Eddy, MD 04/30/17 [phone]

## 2017-04-30 NOTE — ED Notes (Signed)
Pt reports that 2 weeks ago he was pumping gas and passed out.  Pt is unsure if this is related to issues tonight.  Pt also has a history of TIA approx 5 years ago and an MI in January 2017.

## 2017-04-30 NOTE — ED Notes (Signed)
Attempted to call report to floor. Primary RN unavailable at this time. Was informed RN would return call.  

## 2017-04-30 NOTE — ED Notes (Signed)
Pt evaluated by Dr. Gaynelle Adu via [facility] assessment.

## 2017-04-30 NOTE — ED Triage Notes (Addendum)
Pt in via POV with complaints of sudden onset of expressive aphasia and right arm numbness/weakness at [employer].  Pt states, "I couldn't get my words out."  Symptoms seem to have resolved upon arrival.  Pt reports headache.  Pt with hx of TIA.  Vitals WDL.  Transported to CT at this time.

## 2017-04-30 NOTE — Consult Note (Signed)
[facility] TeleNeurology Consult Services  Asked to see this patient in telemedicine consultation. Consultation was performed with assistance of ancillary/medical staff at bedside.  Comments: Last Known normal 1922 Door Time: 1940 [facility] Contacted: 2018 [facility] first log in: 2023 NIHSS assessment time:  2029 Needle Time: no iv tpa Call back time: 2030  HPI:  56 yom with hx of PE and TIA presents with acute onset of confusion, altered speech, and RUE numbness. He states those sxs have resolved but he now had a moderate intensity headache but no nausea or vomiting. He was on a/c until last year for PE and now takes antiplt therapy. He states tonight's sxs were similar to what he experienced 5 yrs ago with TIA but not as bad. CT scan head per my review negative for acute process.    VSS  Gen Wn/Wd in Nad  TeleStroke Assessment: LOC:   0 LOC questions:  0 LOC Commands :   0 Gaze : 0 Visual fields :  0  Facial movements : 0 Upper limb Motor  0 Lower limb Motor  0 Limb Coordination  - 0 Sensory -  - 0 Language -  0 Speech -   0 Neglect / extinction -  0  NIHSS Score: 0    IMPRESSION  TIA RO Stroke Hx of TIA  Hx of pulmonary embolus s/p anticoagulation.  Differential Diagnosis: 1- Cardioembolic stroke 2- Small vessel disease 3- Thrombotic artery to artery mechanism 4- Hypercoagulable state related 5- Thrombotic large vessel disease  Blood Pressure and Blood Glucose within acceptable parameters.   Medical Decision Making:   Patient is not candidate for alteplase due to non focal / localizing exam  Not an IR candidate as low clinical suspicion for LVO by neurologic assessment.   Recommendations: 1- Daily antithrombotics to initiate now if no contraindication.  2- Further work up with Stroke labs, MRI brain, ECHO, NIVS Carotid will be deferred to inpt neurology service 3-  Needs Inpatient Neurology consultation and follow up  4- Thank  you for allowing [place] to participate in the care of your patient, if there are any questions please don't hesitate to contact us  Discussed plan of care with patient/family/hospital staff   Physician: Terrace Arabia, DO   [facility]

## 2017-04-30 NOTE — H&P (Signed)
[facility] [place] at [facility]   PATIENT NAME: Jose StadeKevin Kellis    MR#:  [id number]  DATE OF BIRTH:  [birth year]  DATE OF ADMISSION:  04/30/2017  PRIMARY CARE PHYSICIAN: Allegra GranaArnett, Margaret G, FNP   REQUESTING/REFERRING PHYSICIAN: Roxan Hockeyobinson, MD  CHIEF COMPLAINT:   Chief Complaint  Patient presents with  . Code Stroke    HISTORY OF PRESENT ILLNESS:  Jose Merritt  is a 41 y.o. male who presents with an episode of expressive aphasia followed by right-sided numbness.  Patient states this episode lasted a total of about 5 minutes.  Symptoms then resolved.  Came to the ED for evaluation.  Here his initial workup was largely within normal limits.  Telemetry neurology saw the patient and recommended admission for further evaluation for possible stroke.  Hospitals were called for the same  PAST MEDICAL HISTORY:   Past Medical History:  Diagnosis Date  . Asthma   . Bipolar disorder (HCC)   . CHF (congestive heart failure) (HCC)   . Chicken pox   . Clotting disorder (HCC)    lung  . Depression   . Diabetes mellitus without complication (HCC) 2003   Type 2  . Heart murmur    as child  . Hyperlipidemia   . Hypertension   . Myocardial infarction (HCC) 06/2015  . Retinopathy of both eyes 01/2016  . SOB (shortness of breath) 08/17/2016  . Stroke [facility](HCC)     PAST SURGICAL HISTORY:   Past Surgical History:  Procedure Laterality Date  . CORONARY ANGIOPLASTY WITH STENT PLACEMENT Left 06/2015   LADx1 stent  . WISDOM TOOTH EXTRACTION      SOCIAL HISTORY:   Social History   Tobacco Use  . Smoking status: Never Smoker  . Smokeless tobacco: Never Used  Substance Use Topics  . Alcohol use: No    Comment: rare- 2x/year    FAMILY HISTORY:   Family History  Problem Relation Age of Onset  . Hypertension Mother   . Arthritis Mother   . Heart disease Father        3 heart attacks  . Diabetes Father   . Hypertension Father   . Hyperlipidemia Father    . Alcohol abuse Father   . Hypertension Brother   . Hyperlipidemia Brother   . Heart disease Maternal Uncle   . Heart disease Paternal Aunt   . Heart disease Maternal Grandfather   . Hyperlipidemia Maternal Grandfather   . Diabetes Maternal Grandfather   . Kidney disease Maternal Grandfather   . Heart disease Paternal Grandmother   . Kidney disease Paternal Grandmother   . Hypertension Maternal Grandmother   . Kidney disease Maternal Grandmother   . Arthritis Maternal Grandmother   . Kidney disease Paternal Grandfather     DRUG ALLERGIES:   Allergies  Allergen Reactions  . Metrizamide Anaphylaxis  . Other Anaphylaxis    Shrimp  . Sulfa Antibiotics Anaphylaxis    Uncoded Allergy. Allergen: CT contrast dye. Tolerated contrast for heart catheterization 07/2015  . Contrast Media [Iodinated Diagnostic Agents]     MEDICATIONS AT HOME:   Prior to Admission medications   Medication Sig Start Date End Date Taking? Authorizing Provider  aspirin 81 MG chewable tablet Chew by mouth daily.   Yes [provider]  atorvastatin (LIPITOR) 80 MG tablet Take 1 tablet (80 mg total) by mouth daily. qam 04/01/17  Yes Allegra GranaArnett, Margaret G, FNP  clopidogrel (PLAVIX) 75 MG tablet Take 1 tablet (75 mg total) by  mouth daily. qam 04/01/17  Yes Allegra Grana, FNP  divalproex (DEPAKOTE ER) 500 MG 24 hr tablet Take 2 tablets (1,000 mg total) by mouth every morning. 04/01/17  Yes Allegra Grana, FNP  DULoxetine (CYMBALTA) 60 MG capsule Take 1 capsule (60 mg total) by mouth daily. qam 04/01/17  Yes Arnett, Lyn Records, FNP  insulin lispro (HUMALOG) 100 UNIT/ML KiwkPen Inject 0.3 mLs (30 Units total) into the skin 3 (three) times daily with meals. 04/01/17  Yes Arnett, Lyn Records, FNP  lisinopril (PRINIVIL,ZESTRIL) 5 MG tablet Take 1 tablet (5 mg total) by mouth daily. qam 04/01/17  Yes Allegra Grana, FNP  metoprolol succinate (TOPROL-XL) 100 MG 24 hr tablet Take 1 tablet (100 mg total) by  mouth daily. Take with or immediately following a meal. qam 04/01/17  Yes Arnett, Lyn Records, FNP  nitroGLYCERIN (NITROSTAT) 0.3 MG SL tablet Place 1 tablet (0.3 mg total) under the tongue every 5 (five) minutes as needed. 11/18/16  Yes Allegra Grana, FNP  sertraline (ZOLOFT) 50 MG tablet Take 1 tablet (50 mg total) by mouth daily. 04/01/17  Yes Arnett, Lyn Records, FNP  insulin lispro (HUMALOG) 100 UNIT/ML injection Inject 1 mL (100 Units total) into the skin continuous. Medtronic 670G basal rate 4.0units/hour, I:C ratio 3.5gm/1 unit, goal 10pm-9am= 150mg /dl, 9am-10pm=100mg /dl,  correction factor 1 [id number]=$IEPPIRJJOACZYSAY_TKZSWFUXNATFTDDUKGURKYHCWCBJSEGB$$TDVVOHYWVPXTGGYI_RSWNIOEVOJJKKXFGHWEXHBZJIRCVELFY$ points.  Also uses CGMS.  Continuous pump user since October 2017 11/25/16   11/27/16, MD  Insulin Pen Needle 33G X 5 MM MISC 1 application by Does not apply route 4 (four) times daily as needed. 04/01/17   13/1/18, FNP  spironolactone (ALDACTONE) 25 MG tablet Take 0.5 tablets (12.5 mg total) by mouth daily. qam 11/18/16   11/20/16, FNP    REVIEW OF SYSTEMS:  Review of Systems  Constitutional: Negative for chills, fever, malaise/fatigue and weight loss.  HENT: Negative for ear pain, hearing loss and tinnitus.   Eyes: Negative for blurred vision, double vision, pain and redness.  Respiratory: Negative for cough, hemoptysis and shortness of breath.   Cardiovascular: Negative for chest pain, palpitations, orthopnea and leg swelling.  Gastrointestinal: Negative for abdominal pain, constipation, diarrhea, nausea and vomiting.  Genitourinary: Negative for dysuria, frequency and hematuria.  Musculoskeletal: Negative for back pain, joint pain and neck pain.  Skin:       No acne, rash, or lesions  Neurological: Positive for sensory change and speech change. Negative for dizziness, tremors, focal weakness and weakness.  Endo/Heme/Allergies: Negative for polydipsia. Does not bruise/bleed easily.  Psychiatric/Behavioral: Negative for depression. The patient is not  nervous/anxious and does not have insomnia.      VITAL SIGNS:   Vitals:   04/30/17 2030 04/30/17 2100 04/30/17 2130 04/30/17 2230  BP: 127/79 126/83 121/73 119/74  Pulse: 94 93 93 83  Resp: (!) 24 20 20 19   Temp:      TempSrc:      SpO2: 96% 97% 97% 93%  Weight:      Height:       Wt Readings from Last 3 Encounters:  04/30/17 106.6 kg (235 lb)  04/01/17 106.1 kg (234 lb)  02/09/17 109.5 kg (241 lb 8 oz)    PHYSICAL EXAMINATION:  Physical Exam  Vitals reviewed. Constitutional: He is oriented to person, place, and time. He appears well-developed and well-nourished. No distress.  HENT:  Head: Normocephalic and atraumatic.  Mouth/Throat: Oropharynx is clear and moist.  Eyes: Conjunctivae and EOM are normal. Pupils are equal, round,  and reactive to light. No scleral icterus.  Neck: Normal range of motion. Neck supple. No JVD present. No thyromegaly present.  Cardiovascular: Normal rate, regular rhythm and intact distal pulses. Exam reveals no gallop and no friction rub.  No murmur heard. Respiratory: Effort normal and breath sounds normal. No respiratory distress. He has no wheezes. He has no rales.  GI: Soft. Bowel sounds are normal. He exhibits no distension. There is no tenderness.  Musculoskeletal: Normal range of motion. He exhibits no edema.  No arthritis, no gout  Lymphadenopathy:    He has no cervical adenopathy.  Neurological: He is alert and oriented to person, place, and time. No cranial nerve deficit.  Neurological exam is currently within normal limits with no focal deficits or abnormalities noted  Skin: Skin is warm and dry. No rash noted. No erythema.  Psychiatric: He has a normal mood and affect. His behavior is normal. Judgment and thought content normal.    LABORATORY PANEL:   CBC Recent Labs  Lab 04/30/17 2002  WBC 5.8  HGB 15.4  HCT 45.3  PLT 205    ------------------------------------------------------------------------------------------------------------------  Chemistries  Recent Labs  Lab 04/30/17 2002  NA 133*  K 3.7  CL 100*  CO2 24  GLUCOSE 416*  BUN 15  CREATININE 0.74  CALCIUM 9.3  AST 16  ALT 30  ALKPHOS 137*  BILITOT 0.8   ------------------------------------------------------------------------------------------------------------------  Cardiac Enzymes Recent Labs  Lab 04/30/17 2002  TROPONINI <0.03   ------------------------------------------------------------------------------------------------------------------  RADIOLOGY:  Ct Head Code Stroke Wo Contrast  Result Date: 04/30/2017 CLINICAL DATA:  Code stroke. Sudden onset a aphasia and right arm numbness. EXAM: CT HEAD WITHOUT CONTRAST TECHNIQUE: Contiguous axial images were obtained from the base of the skull through the vertex without intravenous contrast. COMPARISON:  None. FINDINGS: Brain: No evidence of infarction, hemorrhage, hydrocephalus, extra-axial collection or mass lesion/mass effect. Vascular: No asymmetric vessel density or unexpected calcification. Skull: Normal. Negative for fracture or focal lesion. Sinuses/Orbits: Negative Other: Reports relayed to Dr. Roxan Hockey by ER staff 04/30/2017 at 7:57 pm . ASPECTS [facility] Stroke Program Early CT Score) -left hemisphere - Ganglionic level infarction (caudate, lentiform nuclei, internal capsule, insula, M1-M3 cortex): 7 - Supraganglionic infarction (M4-M6 cortex): 3 Total score (0-10 with 10 being normal): 10 IMPRESSION: Negative exam. ASPECTS is 10. Electronically Signed   By: Marnee Spring M.D.   On: 04/30/2017 20:00    EKG:   Orders placed or performed during the hospital encounter of 04/30/17  . ED EKG  . ED EKG    IMPRESSION AND PLAN:  Principal Problem:   Aphasia -currently resolved, raises concern for possible stroke versus TIA.  Will admit per stroke admission order set with  appropriate imaging labs and consults Active Problems:   HTN (hypertension) -patient is currently normotensive, hold antihypertensives for now   Coronary artery disease of native artery of native heart with stable angina pectoris (HCC) -continue home meds   Type 2 diabetes mellitus with circulatory disorder (HCC) -sliding scale insulin corresponding glucose checks   Chronic combined systolic and diastolic CHF (congestive heart failure) (HCC) -continue home meds   HLD (hyperlipidemia) -home dose statin  All the records are reviewed and case discussed with ED provider. Management plans discussed with the patient and/or family.  DVT PROPHYLAXIS: SubQ lovenox  GI PROPHYLAXIS: None  ADMISSION STATUS: Observation  CODE STATUS: Full Code Status History    This patient does not have a recorded code status. Please follow your organizational policy for patients in  this situation.      TOTAL TIME TAKING CARE OF THIS PATIENT: 40 minutes.   Anne Hahn, Demaryius Imran FIELDING 04/30/2017, 11:15 PM  [employer]  [phone]  CC: Primary care physician; Allegra Grana, FNP  Note:  This document was prepared using Dragon voice recognition software and may include unintentional dictation errors.

## 2017-04-30 NOTE — ED Notes (Signed)
Spoke with Dr. Roxan Hockey regarding Dr. Baldemar Friday suggestions.  Acknowledged.

## 2017-05-01 ENCOUNTER — Other Ambulatory Visit: Payer: Self-pay

## 2017-05-01 ENCOUNTER — Observation Stay: Payer: 59

## 2017-05-01 ENCOUNTER — Observation Stay: Admit: 2017-05-01 | Payer: 59

## 2017-05-01 DIAGNOSIS — E119 Type 2 diabetes mellitus without complications: Secondary | ICD-10-CM | POA: Diagnosis not present

## 2017-05-01 DIAGNOSIS — E1159 Type 2 diabetes mellitus with other circulatory complications: Secondary | ICD-10-CM | POA: Diagnosis not present

## 2017-05-01 DIAGNOSIS — F319 Bipolar disorder, unspecified: Secondary | ICD-10-CM | POA: Diagnosis not present

## 2017-05-01 DIAGNOSIS — I10 Essential (primary) hypertension: Secondary | ICD-10-CM | POA: Diagnosis not present

## 2017-05-01 DIAGNOSIS — I429 Cardiomyopathy, unspecified: Secondary | ICD-10-CM | POA: Diagnosis not present

## 2017-05-01 DIAGNOSIS — I5042 Chronic combined systolic (congestive) and diastolic (congestive) heart failure: Secondary | ICD-10-CM | POA: Diagnosis not present

## 2017-05-01 DIAGNOSIS — I2510 Atherosclerotic heart disease of native coronary artery without angina pectoris: Secondary | ICD-10-CM | POA: Diagnosis not present

## 2017-05-01 DIAGNOSIS — I25118 Atherosclerotic heart disease of native coronary artery with other forms of angina pectoris: Secondary | ICD-10-CM | POA: Diagnosis not present

## 2017-05-01 DIAGNOSIS — I110 Hypertensive heart disease with heart failure: Secondary | ICD-10-CM | POA: Diagnosis not present

## 2017-05-01 DIAGNOSIS — E785 Hyperlipidemia, unspecified: Secondary | ICD-10-CM | POA: Diagnosis not present

## 2017-05-01 DIAGNOSIS — R4701 Aphasia: Secondary | ICD-10-CM | POA: Diagnosis not present

## 2017-05-01 DIAGNOSIS — Z9641 Presence of insulin pump (external) (internal): Secondary | ICD-10-CM | POA: Diagnosis not present

## 2017-05-01 LAB — LIPID PANEL
Cholesterol: 117 mg/dL (ref 0–200)
HDL: 27 mg/dL — AB (ref >40)
LDL Cholesterol: 70 mg/dL (ref 0–99)
Total CHOL/HDL Ratio: 4.3 RATIO
Triglycerides: 98 mg/dL (ref <150)
VLDL: 20 mg/dL (ref 0–40)

## 2017-05-01 LAB — GLUCOSE, CAPILLARY
GLUCOSE-CAPILLARY: 220 mg/dL — AB (ref 65–99)
GLUCOSE-CAPILLARY: 286 mg/dL — AB (ref 65–99)

## 2017-05-01 MED ORDER — ACETAMINOPHEN 160 MG/5ML PO SOLN: 650.0000 mg | ORAL | Status: DC | PRN

## 2017-05-01 MED ORDER — INSULIN GLARGINE 100 UNIT/ML ~~LOC~~ SOLN
18.0000 [IU] | Freq: Every day | SUBCUTANEOUS | Status: DC
  Filled 2017-05-01 (×2): qty 0.18

## 2017-05-01 MED ORDER — INSULIN ASPART 100 UNIT/ML ~~LOC~~ SOLN
0.0000 [IU] | Freq: Three times a day (TID) | SUBCUTANEOUS | Status: DC
  Administered 2017-05-01: 7 [IU] via SUBCUTANEOUS
  Filled 2017-05-01: qty 1

## 2017-05-01 MED ORDER — GADOBENATE DIMEGLUMINE 529 MG/ML IV SOLN
20.0000 mL | Freq: Once | INTRAVENOUS | Status: AC | PRN
  Administered 2017-05-01: 20 mL via INTRAVENOUS

## 2017-05-01 MED ORDER — SERTRALINE HCL 50 MG PO TABS
50.0000 mg | ORAL_TABLET | Freq: Every day | ORAL | Status: DC
Start: 2017-05-01 — End: 2017-05-01
  Administered 2017-05-01: 08:00:00 50 mg via ORAL
  Filled 2017-05-01: qty 1

## 2017-05-01 MED ORDER — LORAZEPAM 2 MG/ML IJ SOLN
INTRAMUSCULAR | Status: AC
  Administered 2017-05-01: 02:00:00 1 mg via INTRAVENOUS
  Filled 2017-05-01: qty 1

## 2017-05-01 MED ORDER — ACETAMINOPHEN 650 MG RE SUPP: 650.0000 mg | RECTAL | Status: DC | PRN

## 2017-05-01 MED ORDER — LORAZEPAM 2 MG/ML IJ SOLN
1.0000 mg | Freq: Once | INTRAMUSCULAR | Status: AC
  Administered 2017-05-01: 1 mg via INTRAVENOUS

## 2017-05-01 MED ORDER — ACETAMINOPHEN 325 MG PO TABS: 650.0000 mg | ORAL_TABLET | ORAL | Status: DC | PRN

## 2017-05-01 MED ORDER — CLOPIDOGREL BISULFATE 75 MG PO TABS
75.0000 mg | ORAL_TABLET | Freq: Every day | ORAL | Status: DC
Start: 2017-05-01 — End: 2017-05-01
  Administered 2017-05-01: 75 mg via ORAL
  Filled 2017-05-01: qty 1

## 2017-05-01 MED ORDER — ATORVASTATIN CALCIUM 20 MG PO TABS
80.0000 mg | ORAL_TABLET | Freq: Every day | ORAL | Status: DC
Start: 2017-05-01 — End: 2017-05-01
  Administered 2017-05-01: 08:00:00 80 mg via ORAL
  Filled 2017-05-01: qty 4

## 2017-05-01 MED ORDER — ENOXAPARIN SODIUM 40 MG/0.4ML ~~LOC~~ SOLN: 40.0000 mg | SUBCUTANEOUS | Status: DC

## 2017-05-01 MED ORDER — DIVALPROEX SODIUM ER 500 MG PO TB24
1000.0000 mg | ORAL_TABLET | Freq: Every morning | ORAL | Status: DC
  Administered 2017-05-01: 08:00:00 1000 mg via ORAL
  Filled 2017-05-01: qty 2

## 2017-05-01 MED ORDER — DULOXETINE HCL 30 MG PO CPEP
60.0000 mg | ORAL_CAPSULE | Freq: Every day | ORAL | Status: DC
Start: 2017-05-01 — End: 2017-05-01
  Administered 2017-05-01: 08:00:00 60 mg via ORAL
  Filled 2017-05-01: qty 2

## 2017-05-01 MED ORDER — STROKE: EARLY STAGES OF RECOVERY BOOK
Freq: Once | Status: AC
  Administered 2017-05-01: 03:00:00

## 2017-05-01 MED ORDER — ASPIRIN 81 MG PO CHEW
81.0000 mg | CHEWABLE_TABLET | Freq: Every day | ORAL | Status: DC
  Administered 2017-05-01: 81 mg via ORAL
  Filled 2017-05-01: qty 1

## 2017-05-01 NOTE — Progress Notes (Signed)
Discharge instructions given to Mr. Barmes and family. Verbalized understanding and agreed. PIV d/c'd - site CDI no s/s of infection.

## 2017-05-01 NOTE — Progress Notes (Addendum)
Inpatient Diabetes Program Recommendations  AACE/ADA: New Consensus Statement on Inpatient Glycemic Control (2015)  Target Ranges:  Prepandial:   less than 140 mg/dL      Peak postprandial:   less than 180 mg/dL (1-2 hours)      Critically ill patients:  140 - 180 mg/dL   Lab Results  Component Value Date   GLUCAP 220 (H) 05/01/2017   HGBA1C 10.9 (H) 04/01/2017    Review of Glycemic Control Results for CHRISTOPHERMICH, SENDEJO (MRN [id number]) as of 05/01/2017 08:16  Ref. Range 05/01/2017 00:44 05/01/2017 07:53  Glucose-Capillary Latest Ref Range: 65 - 99 mg/dL 301 (H) 601 (H)     Diabetes history: Type 2 Outpatient Diabetes medications: Humalog 30 units tid, had not been taking Lantus Current orders for Inpatient glycemic control: Novolog 0-20 units tid, Lantus 18 untis qhs  Inpatient Diabetes Program Recommendations:  This is a patient that I have worked with for the last 8 months in an outpatient setting.  Torrez has had some financial problems and although I initially saw him and he was using an insulin pump, he could not afford the eqipment bill and stopped using the pump (about 6 weeks ago).  Recently he has been using only Humalog insulin and not using Lantus.  Rosalio has SEVERE depression and a history of bipolar disease- he often does not take any of his medications and has recently run out of Depakote which, combined with the other psychiatric medications had been doing much better.  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 He has been suicide in the recent past.   Please consider increasing Lantus to 40 units qam *start now  (his pump was delivering 4 units of basal insulin per hour).  Please order Novolog 6 units tid with meals (only hold if he eats less than 50%) and continue Novolof 0-20 units tid.  Please add Novolog 0-5 units qhs.   [name], RN, BA, MHA, CDE Diabetes Coordinator Inpatient Diabetes Program  [phone]  (Team Pager) [phone] [facility] Office) 05/01/2017 8:26 AM

## 2017-05-01 NOTE — Progress Notes (Signed)
Pt off the unit for MRI.

## 2017-05-01 NOTE — Discharge Summary (Signed)
[facility] [place] [facility]   PATIENT NAME: Jose Merritt    MR#:  [id number]  DATE OF BIRTH:  [birth year]  DATE OF ADMISSION:  04/30/2017   ADMITTING PHYSICIAN: Oralia Manis, MD  DATE OF DISCHARGE: 05/01/2017 12:27 PM  PRIMARY CARE PHYSICIAN: Allegra Grana, FNP   ADMISSION DIAGNOSIS:   Aphasia [R47.01] TIA (transient ischemic attack) [G45.9] Dysarthria [R47.1]  DISCHARGE DIAGNOSIS:   Principal Problem:   Aphasia Active Problems:   HTN (hypertension)   HLD (hyperlipidemia)   Coronary artery disease of native artery of native heart with stable angina pectoris (HCC)   Type 2 diabetes mellitus with circulatory disorder (HCC)   Chronic combined systolic and diastolic CHF (congestive heart failure) (HCC)   SECONDARY DIAGNOSIS:   Past Medical History:  Diagnosis Date  . Asthma   . Bipolar disorder (HCC)   . CHF (congestive heart failure) (HCC)   . Chicken pox   . Clotting disorder (HCC)    lung  . Depression   . Diabetes mellitus without complication (HCC) 2003   Type 2  . Heart murmur    as child  . Hyperlipidemia   . Hypertension   . Myocardial infarction (HCC) 06/2015  . Retinopathy of both eyes 01/2016  . SOB (shortness of breath) 08/17/2016  . Stroke [facility])     HOSPITAL COURSE:   41 year old male with multiple medical problems including hypertension, diabetes mellitus on insulin pump, cardiomyopathy with congestive heart failure, depression, hyperlipidemia, history of TIA in the past presents to hospital secondary to confusion and right-sided numbness which were transient.  #1 TIA- changes and symptoms which completely resolved. -Had MRI and MRA done which did not show any acute findings. -Continue aspirin and Plavix. On statin  #2 diabetes mellitus-continue insulin pump  #3 and S2 heart failure-continue home medications. Well compensated  #4 depression-on Zoloft  Patient feeling back to baseline and wants to get  discharge. Echo can be done as outpatient  DISCHARGE CONDITIONS:   Guarded  CONSULTS OBTAINED:   Treatment Team:  Thana Farr, MD  DRUG ALLERGIES:   Allergies  Allergen Reactions  . Metrizamide Anaphylaxis  . Other Anaphylaxis    Shrimp  . Sulfa Antibiotics Anaphylaxis    Uncoded Allergy. Allergen: CT contrast dye. Tolerated contrast for heart catheterization 07/2015  . Contrast Media [Iodinated Diagnostic Agents]    DISCHARGE MEDICATIONS:   Allergies as of 05/01/2017      Reactions   Metrizamide Anaphylaxis   Other Anaphylaxis   Shrimp   Sulfa Antibiotics Anaphylaxis   Uncoded Allergy. Allergen: CT contrast dye. Tolerated contrast for heart catheterization 07/2015   Contrast Media [iodinated Diagnostic Agents]       Medication List    TAKE these medications   aspirin 81 MG chewable tablet Chew by mouth daily.   atorvastatin 80 MG tablet Commonly known as:  LIPITOR Take 1 tablet (80 mg total) by mouth daily. qam   clopidogrel 75 MG tablet Commonly known as:  PLAVIX Take 1 tablet (75 mg total) by mouth daily. qam   divalproex 500 MG 24 hr tablet Commonly known as:  DEPAKOTE ER Take 2 tablets (1,000 mg total) by mouth every morning.   DULoxetine 60 MG capsule Commonly known as:  CYMBALTA Take 1 capsule (60 mg total) by mouth daily. qam   insulin lispro 100 UNIT/ML injection Commonly known as:  HUMALOG Inject 1 mL (100 Units total) into the skin continuous. Medtronic 670G basal rate 4.0units/hour, I:C ratio  3.5gm/1 unit, goal 10pm-9am= 150mg /dl, [id number]=$FAOZHYQMVHQIONGE_XBMWUXLKGMWNUUVOZDGUYQIHKVQQVZDG$$LOVFIEPPIRJJOACZ_YSAYTKZSWFUXNATFTDDUKGURKYHCWCBJ$ /dl,  correction factor 1 SEGBT=51 points.  Also uses CGMS.  Continuous pump user since October 2017   insulin lispro 100 UNIT/ML KiwkPen Commonly known as:  HUMALOG Inject 0.3 mLs (30 Units total) into the skin 3 (three) times daily with meals.   Insulin Pen Needle 33G X 5 MM Misc 1 application by Does not apply route 4 (four) times daily as needed.   lisinopril 5 MG tablet Commonly known as:   PRINIVIL,ZESTRIL Take 1 tablet (5 mg total) by mouth daily. qam   metoprolol succinate 100 MG 24 hr tablet Commonly known as:  TOPROL-XL Take 1 tablet (100 mg total) by mouth daily. Take with or immediately following a meal. qam   nitroGLYCERIN 0.3 MG SL tablet Commonly known as:  NITROSTAT Place 1 tablet (0.3 mg total) under the tongue every 5 (five) minutes as needed.   sertraline 50 MG tablet Commonly known as:  ZOLOFT Take 1 tablet (50 mg total) by mouth daily.   spironolactone 25 MG tablet Commonly known as:  ALDACTONE Take 0.5 tablets (12.5 mg total) by mouth daily. qam        DISCHARGE INSTRUCTIONS:   1. PCP f/u in 1-2 weeks  DIET:   Cardiac diet and Diabetic diet  ACTIVITY:   Activity as tolerated  OXYGEN:   Home Oxygen: No.  Oxygen Delivery: room air  DISCHARGE LOCATION:   home   If you experience worsening of your admission symptoms, develop shortness of breath, life threatening emergency, suicidal or homicidal thoughts you must seek medical attention immediately by calling 911 or calling your MD immediately  if symptoms less severe.  You Must read complete instructions/literature along with all the possible adverse reactions/side effects for all the Medicines you take and that have been prescribed to you. Take any new Medicines after you have completely understood and accpet all the possible adverse reactions/side effects.   Please note  You were cared for by a hospitalist during your hospital stay. If you have any questions about your discharge medications or the care you received while you were in the hospital after you are discharged, you can call the unit and asked to speak with the hospitalist on call if the hospitalist that took care of you is not available. Once you are discharged, your primary care physician will handle any further medical issues. Please note that NO REFILLS for any discharge medications will be authorized once you are discharged,  as it is imperative that you return to your primary care physician (or establish a relationship with a primary care physician if you do not have one) for your aftercare needs so that they can reassess your need for medications and monitor your lab values.    On the day of Discharge:  VITAL SIGNS:   Blood pressure 110/66, pulse 72, temperature 98.8 F (37.1 C), resp. rate 16, height 6' (1.829 m), weight 105.3 kg (232 lb 1.6 oz), SpO2 97 %.  PHYSICAL EXAMINATION:    GENERAL:  41 y.o.-year-old patient lying in the bed with no acute distress.  EYES: Pupils equal, round, reactive to light and accommodation. No scleral icterus. Extraocular muscles intact.  HEENT: Head atraumatic, normocephalic. Oropharynx and nasopharynx clear.  NECK:  Supple, no jugular venous distention. No thyroid enlargement, no tenderness.  LUNGS: Normal breath sounds bilaterally, no wheezing, rales,rhonchi or crepitation. No use of accessory muscles of respiration.  CARDIOVASCULAR: S1, S2 normal. No murmurs, rubs, or gallops.  ABDOMEN: Soft, non-tender, non-distended.  Bowel sounds present. No organomegaly or mass.  EXTREMITIES: No pedal edema, cyanosis, or clubbing.  NEUROLOGIC: Cranial nerves II through XII are intact. Muscle strength 5/5 in all extremities. Sensation intact. Gait not checked.  PSYCHIATRIC: The patient is alert and oriented x 3.  SKIN: No obvious rash, lesion, or ulcer.   DATA REVIEW:   CBC Recent Labs  Lab 04/30/17 2002  WBC 5.8  HGB 15.4  HCT 45.3  PLT 205    Chemistries  Recent Labs  Lab 04/30/17 2002  NA 133*  K 3.7  CL 100*  CO2 24  GLUCOSE 416*  BUN 15  CREATININE 0.74  CALCIUM 9.3  AST 16  ALT 30  ALKPHOS 137*  BILITOT 0.8     Microbiology Results  No results found for this or any previous visit.  RADIOLOGY:  Mr Maxine Glenn Neck W Wo Contrast  Result Date: 05/01/2017 CLINICAL DATA:  Initial evaluation for acute word-finding difficulty, right arm weakness. EXAM: MRI HEAD  WITHOUT CONTRAST MRA HEAD WITHOUT CONTRAST MRA NECK WITHOUT AND WITH CONTRAST TECHNIQUE: Multiplanar%comma% multiecho pulse sequences of the brain and surrounding structures were obtained without intravenous contrast. Angiographic images of the Circle of Willis were obtained using MRA technique without intravenous contrast. Angiographic images of the neck were obtained using MRA technique without and with intravenous contrast. Carotid stenosis measurements (when applicable) are obtained utilizing NASCET criteria, using the distal internal carotid diameter as the denominator. CONTRAST:  20mL MULTIHANCE GADOBENATE DIMEGLUMINE 529 MG/ML IV SOLN COMPARISON:  Prior CT from 04/30/2017. FINDINGS: MRI HEAD FINDINGS Brain: Cerebral volume within normal limits for patient age. No focal parenchymal signal abnormality identified. No abnormal foci of restricted diffusion to suggest acute or subacute ischemia. Gray-white matter differentiation well maintained. No encephalomalacia to suggest chronic infarction. No foci of susceptibility artifact to suggest acute or chronic intracranial hemorrhage. No mass lesion, midline shift or mass effect. No hydrocephalus. No extra-axial fluid collection. Major dural sinuses are grossly patent. Pituitary gland and suprasellar region are normal. Midline structures intact and normal. Vascular: Major intracranial vascular flow voids well maintained and normal in appearance. Skull and upper cervical spine: Craniocervical junction normal. Visualized upper cervical spine within normal limits. Bone marrow signal intensity normal. No scalp soft tissue abnormality. Sinuses/Orbits: Globes and orbital soft tissues within normal limits. Paranasal sinuses are clear. No mastoid effusion. Inner ear structures normal. Other: None. MRA HEAD FINDINGS ANTERIOR CIRCULATION: Distal cervical segments of the internal carotid arteries are patent with antegrade flow. Petrous, cavernous, and supraclinoid segments  widely patent without stenosis. ICA termini widely patent. A1 segments patent bilaterally. Right A1 segment hypoplastic. Normal anterior communicating artery. Anterior cerebral artery is patent to their distal aspects without stenosis. M1 segments patent without stenosis or occlusion. Normal MCA bifurcations. No proximal M2 occlusion. Distal MCA branches well perfused and symmetric. POSTERIOR CIRCULATION: Vertebral arteries patent to the vertebrobasilar junction. Basilar artery widely patent to its distal aspect. Superior cerebellar is patent bilaterally. Left PCA supplied via the basilar. Fetal type origin of the right PCA. PCAs widely patent to their distal aspects. No aneurysm. MRA NECK FINDINGS Examination limited due to timing of the contrast bolus. Little to no contrast is an the arterial system. Aortic Arch and origin of the great vessels not evaluated on this exam due to timing the contrast bolus. On time-of-flight imaging, the visualize common carotid arteries are widely patent with antegrade flow. No significant atheromatous irregularity or narrowing seen about the carotid bifurcations/ proximal ICAs. The visualized internal carotid arteries  are widely patent without stenosis. Visualized mid and distal vertebral artery is are widely patent to the skullbase with antegrade flow. No obvious stenosis about the vertebral arteries bilaterally. IMPRESSION: MRI HEAD IMPRESSION: Normal brain MRI.  No acute intracranial abnormality identified. MRA HEAD IMPRESSION: Normal intracranial MRA. MRA NECK IMPRESSION: 1. Limited study due to timing of the contrast bolus. 2. Grossly normal MRA of the neck with widely patent carotid and vertebral arteries bilaterally. Please note that the proximal great vessels are essentially not evaluated on this limited exam. If further imaging is desired, a repeat study or follow-up CTA could be performed for further evaluation. Electronically Signed   By: Rise MuBenjamin  McClintock M.D.   On:  05/01/2017 06:07   Mr Brain Wo Contrast  Result Date: 05/01/2017 CLINICAL DATA:  Initial evaluation for acute word-finding difficulty, right arm weakness. EXAM: MRI HEAD WITHOUT CONTRAST MRA HEAD WITHOUT CONTRAST MRA NECK WITHOUT AND WITH CONTRAST TECHNIQUE: Multiplanar%comma% multiecho pulse sequences of the brain and surrounding structures were obtained without intravenous contrast. Angiographic images of the Circle of Willis were obtained using MRA technique without intravenous contrast. Angiographic images of the neck were obtained using MRA technique without and with intravenous contrast. Carotid stenosis measurements (when applicable) are obtained utilizing NASCET criteria, using the distal internal carotid diameter as the denominator. CONTRAST:  20mL MULTIHANCE GADOBENATE DIMEGLUMINE 529 MG/ML IV SOLN COMPARISON:  Prior CT from 04/30/2017. FINDINGS: MRI HEAD FINDINGS Brain: Cerebral volume within normal limits for patient age. No focal parenchymal signal abnormality identified. No abnormal foci of restricted diffusion to suggest acute or subacute ischemia. Gray-white matter differentiation well maintained. No encephalomalacia to suggest chronic infarction. No foci of susceptibility artifact to suggest acute or chronic intracranial hemorrhage. No mass lesion, midline shift or mass effect. No hydrocephalus. No extra-axial fluid collection. Major dural sinuses are grossly patent. Pituitary gland and suprasellar region are normal. Midline structures intact and normal. Vascular: Major intracranial vascular flow voids well maintained and normal in appearance. Skull and upper cervical spine: Craniocervical junction normal. Visualized upper cervical spine within normal limits. Bone marrow signal intensity normal. No scalp soft tissue abnormality. Sinuses/Orbits: Globes and orbital soft tissues within normal limits. Paranasal sinuses are clear. No mastoid effusion. Inner ear structures normal. Other: None. MRA  HEAD FINDINGS ANTERIOR CIRCULATION: Distal cervical segments of the internal carotid arteries are patent with antegrade flow. Petrous, cavernous, and supraclinoid segments widely patent without stenosis. ICA termini widely patent. A1 segments patent bilaterally. Right A1 segment hypoplastic. Normal anterior communicating artery. Anterior cerebral artery is patent to their distal aspects without stenosis. M1 segments patent without stenosis or occlusion. Normal MCA bifurcations. No proximal M2 occlusion. Distal MCA branches well perfused and symmetric. POSTERIOR CIRCULATION: Vertebral arteries patent to the vertebrobasilar junction. Basilar artery widely patent to its distal aspect. Superior cerebellar is patent bilaterally. Left PCA supplied via the basilar. Fetal type origin of the right PCA. PCAs widely patent to their distal aspects. No aneurysm. MRA NECK FINDINGS Examination limited due to timing of the contrast bolus. Little to no contrast is an the arterial system. Aortic Arch and origin of the great vessels not evaluated on this exam due to timing the contrast bolus. On time-of-flight imaging, the visualize common carotid arteries are widely patent with antegrade flow. No significant atheromatous irregularity or narrowing seen about the carotid bifurcations/ proximal ICAs. The visualized internal carotid arteries are widely patent without stenosis. Visualized mid and distal vertebral artery is are widely patent to the skullbase with antegrade flow. No  obvious stenosis about the vertebral arteries bilaterally. IMPRESSION: MRI HEAD IMPRESSION: Normal brain MRI.  No acute intracranial abnormality identified. MRA HEAD IMPRESSION: Normal intracranial MRA. MRA NECK IMPRESSION: 1. Limited study due to timing of the contrast bolus. 2. Grossly normal MRA of the neck with widely patent carotid and vertebral arteries bilaterally. Please note that the proximal great vessels are essentially not evaluated on this limited  exam. If further imaging is desired, a repeat study or follow-up CTA could be performed for further evaluation. Electronically Signed   By: Rise Mu M.D.   On: 05/01/2017 06:07   Mr Maxine Glenn Head/brain YN Cm  Result Date: 05/01/2017 CLINICAL DATA:  Initial evaluation for acute word-finding difficulty, right arm weakness. EXAM: MRI HEAD WITHOUT CONTRAST MRA HEAD WITHOUT CONTRAST MRA NECK WITHOUT AND WITH CONTRAST TECHNIQUE: Multiplanar%comma% multiecho pulse sequences of the brain and surrounding structures were obtained without intravenous contrast. Angiographic images of the Circle of Willis were obtained using MRA technique without intravenous contrast. Angiographic images of the neck were obtained using MRA technique without and with intravenous contrast. Carotid stenosis measurements (when applicable) are obtained utilizing NASCET criteria, using the distal internal carotid diameter as the denominator. CONTRAST:  20mL MULTIHANCE GADOBENATE DIMEGLUMINE 529 MG/ML IV SOLN COMPARISON:  Prior CT from 04/30/2017. FINDINGS: MRI HEAD FINDINGS Brain: Cerebral volume within normal limits for patient age. No focal parenchymal signal abnormality identified. No abnormal foci of restricted diffusion to suggest acute or subacute ischemia. Gray-white matter differentiation well maintained. No encephalomalacia to suggest chronic infarction. No foci of susceptibility artifact to suggest acute or chronic intracranial hemorrhage. No mass lesion, midline shift or mass effect. No hydrocephalus. No extra-axial fluid collection. Major dural sinuses are grossly patent. Pituitary gland and suprasellar region are normal. Midline structures intact and normal. Vascular: Major intracranial vascular flow voids well maintained and normal in appearance. Skull and upper cervical spine: Craniocervical junction normal. Visualized upper cervical spine within normal limits. Bone marrow signal intensity normal. No scalp soft tissue  abnormality. Sinuses/Orbits: Globes and orbital soft tissues within normal limits. Paranasal sinuses are clear. No mastoid effusion. Inner ear structures normal. Other: None. MRA HEAD FINDINGS ANTERIOR CIRCULATION: Distal cervical segments of the internal carotid arteries are patent with antegrade flow. Petrous, cavernous, and supraclinoid segments widely patent without stenosis. ICA termini widely patent. A1 segments patent bilaterally. Right A1 segment hypoplastic. Normal anterior communicating artery. Anterior cerebral artery is patent to their distal aspects without stenosis. M1 segments patent without stenosis or occlusion. Normal MCA bifurcations. No proximal M2 occlusion. Distal MCA branches well perfused and symmetric. POSTERIOR CIRCULATION: Vertebral arteries patent to the vertebrobasilar junction. Basilar artery widely patent to its distal aspect. Superior cerebellar is patent bilaterally. Left PCA supplied via the basilar. Fetal type origin of the right PCA. PCAs widely patent to their distal aspects. No aneurysm. MRA NECK FINDINGS Examination limited due to timing of the contrast bolus. Little to no contrast is an the arterial system. Aortic Arch and origin of the great vessels not evaluated on this exam due to timing the contrast bolus. On time-of-flight imaging, the visualize common carotid arteries are widely patent with antegrade flow. No significant atheromatous irregularity or narrowing seen about the carotid bifurcations/ proximal ICAs. The visualized internal carotid arteries are widely patent without stenosis. Visualized mid and distal vertebral artery is are widely patent to the skullbase with antegrade flow. No obvious stenosis about the vertebral arteries bilaterally. IMPRESSION: MRI HEAD IMPRESSION: Normal brain MRI.  No acute intracranial abnormality identified. MRA  HEAD IMPRESSION: Normal intracranial MRA. MRA NECK IMPRESSION: 1. Limited study due to timing of the contrast bolus. 2.  Grossly normal MRA of the neck with widely patent carotid and vertebral arteries bilaterally. Please note that the proximal great vessels are essentially not evaluated on this limited exam. If further imaging is desired, a repeat study or follow-up CTA could be performed for further evaluation. Electronically Signed   By: Rise Mu M.D.   On: 05/01/2017 06:07   Ct Head Code Stroke Wo Contrast  Result Date: 04/30/2017 CLINICAL DATA:  Code stroke. Sudden onset a aphasia and right arm numbness. EXAM: CT HEAD WITHOUT CONTRAST TECHNIQUE: Contiguous axial images were obtained from the base of the skull through the vertex without intravenous contrast. COMPARISON:  None. FINDINGS: Brain: No evidence of infarction, hemorrhage, hydrocephalus, extra-axial collection or mass lesion/mass effect. Vascular: No asymmetric vessel density or unexpected calcification. Skull: Normal. Negative for fracture or focal lesion. Sinuses/Orbits: Negative Other: Reports relayed to Dr. Roxan Hockey by ER staff 04/30/2017 at 7:57 pm . ASPECTS [facility] Stroke Program Early CT Score) -left hemisphere - Ganglionic level infarction (caudate, lentiform nuclei, internal capsule, insula, M1-M3 cortex): 7 - Supraganglionic infarction (M4-M6 cortex): 3 Total score (0-10 with 10 being normal): 10 IMPRESSION: Negative exam. ASPECTS is 10. Electronically Signed   By: Marnee Spring M.D.   On: 04/30/2017 20:00     Management plans discussed with the patient, family and they are in agreement.  CODE STATUS:     Code Status Orders  (From admission, onward)        Start     Ordered   05/01/17 0042  Full code  Continuous     05/01/17 0041    Code Status History    Date Active Date Inactive Code Status Order ID Comments User Context   This patient has a current code status but no historical code status.      TOTAL TIME TAKING CARE OF THIS PATIENT: 38 minutes.    Enid Baas M.D on 05/01/2017 at 12:58 PM  Between 7am to  6pm - Pager - [phone]  After 6pm go to [email or web address] - Social research officer, government  [facility] [place] [facility]  Office  [phone]  CC: Primary care physician; Allegra Grana, FNP   Note: This dictation was prepared with Dragon dictation along with smaller phrase technology. Any transcriptional errors that result from this process are unintentional.

## 2017-05-01 NOTE — Progress Notes (Signed)
SLP Cancellation Note  Patient Details Name: Jose Merritt MRN: [id number] DOB: 06/05/1975   Cancelled treatment:       Reason Eval/Treat Not Completed: SLP screened, no needs identified, will sign off Reviewed chart and spoke w/nsg and pt. Pt reports he is back to baseline. Denies any speech/language or swallowing difficulties. No ST indicated at this time. Nsg reports pt is discharging. Please refer to Outpatient ST if further concerns arise.   Lauree Chandler, [place], [employer]  Speech-Language Pathologist    [name],[name] 05/01/2017, 11:23 AM

## 2017-05-03 ENCOUNTER — Other Ambulatory Visit: Payer: Self-pay

## 2017-05-03 LAB — MISC LABCORP TEST (SEND OUT): Labcorp test code: 1453

## 2017-05-03 LAB — HIV ANTIBODY (ROUTINE TESTING W REFLEX): HIV SCREEN 4TH GENERATION: NONREACTIVE

## 2017-05-03 NOTE — Patient Outreach (Signed)
[facility]) Care Management  05/03/2017  Jose Merritt Nov 24, 1975 [id number]   Was able to reach [facility] by e-mail this morning, but unable to reach him by phone. We communicated back and forth a number of times:   Me- Are you wearing you insulin pump? I'm reading discharge orders from the hospital and it says "resume pump"????  Jose Merritt- No. I have no supplies. I'm trying to use my pens but I forget a lot. Depression and sugars are keeping me down. I'm working twice a week but I'm scared I'll have another mini stroke and wreck our new car or with ella.   Me- Jose Merritt,  Is there any way you and Pattricia Boss can pay the bill at [employer] so you can get back on the pump?  Was Dr. Maryruth Bun ever willing to see you again after she billed you for the no show and get your medications re-ordered?   Jose Merritt- No. She wasn't. Dr Drinda Butts filled my scripts so I have meds. I cant pay the [employer] bill. Not even close. I barely put gas in my truck. Struggling. Thanks for checking on me Jose Merritt.   Jose Merritt-So you have the medication that Dr. Maryruth Bun had originally ordered- Depakote?   How about Lantus- did Dr. Yehuda Savannah order that?  Jose Merritt- I dont think so. I'm not sure.  But yes depakote I have.   Jose Merritt- Have you started it?  And are you taking the anti-depressants- if we can get your blood levels stable we can work on diabetes after that..- I'll work on the Lantus with the MD... but will you check to see if you have any first and let me know?   Jose Merritt- I have sent a message to the office to ask about starting on Lantus to go along with the Novolog.  ( I understand Arnett's on maternity leave).    I sent a message to Dr. Darrick Huntsman now Judie Petit. Jason Coop is on maternity leave)- requesting Lantus order since he has no basal insulin ordered.  I sent him an email telling me I have done this and notified Jceon.   I have called him now and unable to reach him- phone did not have an opportunity to leave a message. Sent  an e-mail with 2 phone numbers letting him know I'm calling.    [name], RN, BA, MHA, CDE [facility] Diabetes Coordinator- [name] Dial:  [phone]  Fax:  [phone] E-mail: [email or web address] [address], [place], [place]  [address]                  F

## 2017-05-04 ENCOUNTER — Other Ambulatory Visit: Payer: Self-pay | Admitting: Internal Medicine

## 2017-05-04 ENCOUNTER — Other Ambulatory Visit: Payer: Self-pay

## 2017-05-04 MED ORDER — INSULIN GLARGINE 100 UNIT/ML ~~LOC~~ SOLN: 40.0000 [IU] | Freq: Every day | SUBCUTANEOUS | 11 refills | Status: DC

## 2017-05-04 MED ORDER — "INSULIN SYRINGE/NEEDLE 28G X 1/2"" 1 ML MISC": 0 refills | Status: DC

## 2017-05-04 NOTE — Patient Outreach (Signed)
[facility]) Care Management  05/04/2017  Jose Merritt [birth year] [id number]   Spoke to Jose Merritt today regarding his recent admission to hospital.  [name] was not discharged home on any new medications.  His discharged note states to continue insulin pump but he had not been on insulin pump for a number of weeks.   He had no basal insulin ordered so I reached out to Dr. Darrick Huntsman ([name] is out on maternity leave) and she has ordered Lantus 40 units qhs, increase 1 unit daily until fasting blood sugars are less than 150mg /dl.  [name] has used basal insulin in the past- I have instructed him to take it qpm (around 10pm) and to take Novolog tid with meals.  [name] has syringes at home but does not have a prescription at the pharmacy- I have requested this from the MD.   Jose Merritt does not have a follow up appointment scheduled but he has been instructed to call for a follow up appointment with cardiologist Dr. Mariah Milling.  I have instructed patient to e-mail me with any concerns.    [name], RN, BA, MHA, CDE [facility] Dial:  [phone]  Fax:  [phone] E-mail: [name] [address] [place], [place]  [address]

## 2017-05-14 ENCOUNTER — Encounter: Payer: Self-pay | Admitting: *Deleted

## 2017-07-12 ENCOUNTER — Telehealth: Payer: Self-pay | Admitting: Family

## 2017-07-12 NOTE — Telephone Encounter (Signed)
-----   Message from Suszanne Conners, RN sent at 05/03/2017  3:20 PM EST ----- Regarding: recent hospital admission Patient of NP- Arnett.  42 yr old - hx bipolar, diabetes, stroke, MI .  Now TIA.    Went to hospital with stroke like symptoms. Was discharged home on insulin pump which he has not been on for about 6 weeks because he can't pay the bill for supplies.  Does not have an order for basal insulin (needs Lantus). Last saw endocrinology in July at which time he was on the pump- on the pump he was getting 4 units of basal per day- can you order Lantus for him? - perhaps 40 units per day to start and increase 1 unit/day until fasting CBG 150mg /dl?

## 2017-07-12 NOTE — Telephone Encounter (Signed)
Please give pt courtesy call and ensure he has a f/u appt with [place] ; havent seen him since 11/18

## 2017-07-15 NOTE — Telephone Encounter (Signed)
Please ensure 30 min slot. All f/us are 30 minutes. Not sure why my scheduled is messed up that day .Marland KitchenMarland Kitchen

## 2017-07-15 NOTE — Telephone Encounter (Signed)
Spoke with patient appointment scheduled for 08/04/17 @ 3:45

## 2017-08-04 ENCOUNTER — Encounter: Payer: Self-pay | Admitting: Family

## 2017-08-04 ENCOUNTER — Ambulatory Visit: Payer: 59 | Admitting: Family

## 2017-08-04 VITALS — BP 114/74 | HR 89 | Temp 98.1°F | Wt 219.4 lb

## 2017-08-04 DIAGNOSIS — I509 Heart failure, unspecified: Secondary | ICD-10-CM | POA: Diagnosis not present

## 2017-08-04 DIAGNOSIS — Z8673 Personal history of transient ischemic attack (TIA), and cerebral infarction without residual deficits: Secondary | ICD-10-CM | POA: Diagnosis not present

## 2017-08-04 DIAGNOSIS — E1159 Type 2 diabetes mellitus with other circulatory complications: Secondary | ICD-10-CM

## 2017-08-04 DIAGNOSIS — M542 Cervicalgia: Secondary | ICD-10-CM | POA: Insufficient documentation

## 2017-08-04 DIAGNOSIS — Z23 Encounter for immunization: Secondary | ICD-10-CM | POA: Diagnosis not present

## 2017-08-04 DIAGNOSIS — I10 Essential (primary) hypertension: Secondary | ICD-10-CM

## 2017-08-04 DIAGNOSIS — F3175 Bipolar disorder, in partial remission, most recent episode depressed: Secondary | ICD-10-CM | POA: Diagnosis not present

## 2017-08-04 NOTE — Progress Notes (Signed)
Subjective:    Patient ID: Jose Merritt, male    DOB: 10/24/1975, 42 y.o.   MRN: [id number]  CC: Jose Merritt is a 42 y.o. male who presents today for follow up.   HPI:   Complains of right neck pain, comes and goes for past 2 days.No pain in arms or with lifting arm. No numbness in arm as had .  Not painful to the touch.  Denies exertional chest pain or pressure, numbness or tingling radiating to left arm or jaw, palpitations, dizziness, frequent headaches, changes in vision, or shortness of breath.   DM- not checking; no longer on [employer] pump. 2 weeks ago 485. Doing well on pump and had gotten down to 8. 05/2017 states 12.4. Trouble taking insulin pens with him. Denies polyuria, polydipsia.   lantus 25 at night humalog 12 WMs and then to SSI.    HTN- compliant with medications   Bipolar-  Stopped taking depakote as was worried would run out. No si/hi.  Needs refills on everything, except for pen tips for insulin.    Admission for TIA 04/2017 Normal MRI, MRA On asa, plavix, statin  HISTORY:  Past Medical History:  Diagnosis Date  . Asthma   . Bipolar disorder (HCC)   . CHF (congestive heart failure) (HCC)   . Chicken pox   . Clotting disorder (HCC)    lung  . Depression   . Diabetes mellitus without complication (HCC) 2003   Type 2  . Heart murmur    as child  . Hyperlipidemia   . Hypertension   . Myocardial infarction (HCC) 06/2015  . Retinopathy of both eyes 01/2016  . SOB (shortness of breath) 08/17/2016  . Stroke [facility])    Past Surgical History:  Procedure Laterality Date  . CORONARY ANGIOPLASTY WITH STENT PLACEMENT Left 06/2015   LADx1 stent  . WISDOM TOOTH EXTRACTION     Family History  Problem Relation Age of Onset  . Hypertension Mother   . Arthritis Mother   . Heart disease Father        3 heart attacks  . Diabetes Father   . Hypertension Father   . Hyperlipidemia Father   . Alcohol abuse Father   . Hypertension  Brother   . Hyperlipidemia Brother   . Heart disease Maternal Uncle   . Heart disease Paternal Aunt   . Heart disease Maternal Grandfather   . Hyperlipidemia Maternal Grandfather   . Diabetes Maternal Grandfather   . Kidney disease Maternal Grandfather   . Heart disease Paternal Grandmother   . Kidney disease Paternal Grandmother   . Hypertension Maternal Grandmother   . Kidney disease Maternal Grandmother   . Arthritis Maternal Grandmother   . Kidney disease Paternal Grandfather     Allergies: Metrizamide; Other; Sulfa antibiotics; and Contrast media [iodinated diagnostic agents] Current Outpatient Medications on File Prior to Visit  Medication Sig Dispense Refill  . aspirin 81 MG chewable tablet Chew by mouth daily.    Marland Kitchen atorvastatin (LIPITOR) 80 MG tablet Take 1 tablet (80 mg total) by mouth daily. qam 90 tablet 2  . clopidogrel (PLAVIX) 75 MG tablet Take 1 tablet (75 mg total) by mouth daily. qam 90 tablet 2  . divalproex (DEPAKOTE ER) 500 MG 24 hr tablet Take 2 tablets (1,000 mg total) by mouth every morning. 180 tablet 1  . DULoxetine (CYMBALTA) 60 MG capsule Take 1 capsule (60 mg total) by mouth daily. qam 90 capsule 2  . INS SYRINGE/NEEDLE  1CC/28G (B-D INSULIN SYRINGE 1CC/28G) 28G X 1/2" 1 ML MISC Use daily for insulin administration 100 each 0  . insulin glargine (LANTUS) 100 UNIT/ML injection Inject 0.4 mLs (40 Units total) into the skin daily. Increase by 1 units/day until fasting BS are < 150 10 mL 11  . insulin lispro (HUMALOG) 100 UNIT/ML KiwkPen Inject 0.3 mLs (30 Units total) into the skin 3 (three) times daily with meals. 15 mL 4  . Insulin Pen Needle 33G X 5 MM MISC 1 application by Does not apply route 4 (four) times daily as needed. 100 each 3  . lisinopril (PRINIVIL,ZESTRIL) 5 MG tablet Take 1 tablet (5 mg total) by mouth daily. qam 90 tablet 2  . metoprolol succinate (TOPROL-XL) 100 MG 24 hr tablet Take 1 tablet (100 mg total) by mouth daily. Take with or  immediately following a meal. qam 90 tablet 2  . nitroGLYCERIN (NITROSTAT) 0.3 MG SL tablet Place 1 tablet (0.3 mg total) under the tongue every 5 (five) minutes as needed. 90 tablet 0  . sertraline (ZOLOFT) 50 MG tablet Take 1 tablet (50 mg total) by mouth daily. 90 tablet 1  . spironolactone (ALDACTONE) 25 MG tablet Take 0.5 tablets (12.5 mg total) by mouth daily. qam 45 tablet 2  . insulin lispro (HUMALOG) 100 UNIT/ML injection Inject 1 mL (100 Units total) into the skin continuous. [employer] 670G basal rate 4.0units/hour, I:C ratio 3.5gm/1 unit, goal 10pm-9am= 150mg /dl, 9am-10pm=100mg /dl,  correction factor 1 2VO-35KK=$[id number]_[id number]XVQ$$[id number]_IWPYKDXIPJASNKNLZJQBHALPFXTKWIOX$ points.  Also uses CGMS.  Continuous pump user since October 2017 (Patient not taking: Reported on 05/04/2017) 10 vial 2   No current facility-administered medications on file prior to visit.     Social History   Tobacco Use  . Smoking status: Never Smoker  . Smokeless tobacco: Never Used  Substance Use Topics  . Alcohol use: No    Comment: rare- 2x/year  . Drug use: No    Review of Systems  Constitutional: Negative for chills and fever.  Respiratory: Negative for cough.   Cardiovascular: Negative for chest pain and palpitations.  Gastrointestinal: Negative for nausea and vomiting.  Endocrine: Negative for polydipsia, polyphagia and polyuria.  Genitourinary: Negative for frequency.  Musculoskeletal: Positive for neck pain. Negative for neck stiffness.  Psychiatric/Behavioral: Negative for suicidal ideas.      Objective:    BP 114/74 (BP Location: Left Arm, Patient Position: Sitting, Cuff Size: Normal)   Pulse 89   Temp 98.1 F (36.7 C) (Oral)   Wt 219 lb 6 oz (99.5 kg)   SpO2 96%   BMI 29.75 kg/m  BP Readings from Last 3 Encounters:  08/04/17 114/74  05/01/17 110/66  04/01/17 117/80   Wt Readings from Last 3 Encounters:  08/04/17 219 lb 6 oz (99.5 kg)  05/01/17 232 lb 1.6 oz (105.3 kg)  04/01/17 234 lb (106.1 kg)    Physical Exam  Constitutional:  He appears well-developed and well-nourished.  Neck: Normal range of motion and full passive range of motion without pain. Neck supple. No spinous process tenderness and no muscular tenderness present. Carotid bruit is not present. No erythema and normal range of motion present.    No pain during exam. Area of concern described by patient and marked on diagram  Cardiovascular: Regular rhythm and normal heart sounds.  No BLE edema  Pulmonary/Chest: Effort normal and breath sounds normal. No respiratory distress. He has no wheezes. He has no rhonchi. He has no rales.  Neurological: He is alert.  Skin: Skin is  warm and dry.  Psychiatric: He has a normal mood and affect. His speech is normal and behavior is normal.  Vitals reviewed.      Assessment & Plan:   Problem List Items Addressed This Visit      Cardiovascular and Mediastinum   HTN (hypertension)    Controlled today. NO changes to regimen. Advised continued f/u with cardiology, gollan which he verbalized understanding.       Heart failure (HCC)    No evidence of fluid volume overload. Weight down , however I suspect this is from uncontrolled DM      Type 2 diabetes mellitus with circulatory disorder (HCC) - Primary    Uncontrolled. Messaged caroline to see if we can help here. In the meantime, he will start back on prior regimen which he had started with dr Tedd Sias. Insulin pens refilled.       Relevant Orders   CBC with Differential/Platelet   Comprehensive metabolic panel   Hemoglobin A1c   Lipid panel   TSH   VITAMIN D 25 Hydroxy (Vit-D Deficiency, Fractures)     Other   Bipolar disorder, in partial remission, most recent episode depressed [facility])    Patient adamant to maintain  Medications here. He has been off the depakote and we discussed how this may be contributory to his not being compliant with insulin. It is paramount to maintain mood so that he can maintain co morbidities. We are in agreement of this today. No  SI/HI today. Advised close follow up. Also consulted with pharmacy regarding depakote and monitoring. At this juncture, I feel compelled to refill/maintain. As I explained to patient, this is not a medication I routinely prescribe. Ultimately, I want him to be safe and stable; I  agreed to maintain his current regimen since working.  Case reviewed with supervising, Dr Duncan Dull, and she and I jointly agreed on management plan.        History of CVA (cerebrovascular accident)   Relevant Orders   US Carotid Duplex Bilateral   Neck pain    No neck pain during exam. Reviewed MRI and MRA with patient from 05/2017 which is reassuring. Specifically no aneurysm, or significant carotid stenosis. Presentation of pain for 2 days , suspect more MS etiology. We agreed to maintain close vigilance and to try conservative therapy. Patient will let me know if doesn't resolve or certainly if any new features.        Other Visit Diagnoses    Need for pneumococcal vaccination       Relevant Orders   Pneumococcal polysaccharide vaccine 23-valent greater than or equal to 2yo subcutaneous/IM (Completed)       I am having Vania Rea. Janee Morn maintain his aspirin, spironolactone, nitroGLYCERIN, insulin lispro, atorvastatin, clopidogrel, divalproex, DULoxetine, lisinopril, metoprolol succinate, sertraline, insulin lispro, Insulin Pen Needle, insulin glargine, and INS SYRINGE/NEEDLE 1CC/28G.   No orders of the defined types were placed in this encounter.   Return precautions given.   Risks, benefits, and alternatives of the medications and treatment plan prescribed today were discussed, and patient expressed understanding.   Education regarding symptom management and diagnosis given to patient on AVS.  Continue to follow with Allegra Grana, FNP for routine health maintenance.   Dixie Dials and I agreed with plan.   [name], FNP

## 2017-08-04 NOTE — Assessment & Plan Note (Signed)
Patient adamant to maintain  Medications here. He has been off the depakote and we discussed how this may be contributory to his not being compliant with insulin. It is paramount to maintain mood so that he can maintain co morbidities. We are in agreement of this today. No SI/HI today. Advised close follow up. Also consulted with pharmacy regarding depakote and monitoring. At this juncture, I feel compelled to refill/maintain. As I explained to patient, this is not a medication I routinely prescribe. Ultimately, I want him to be safe and stable; I  agreed to maintain his current regimen since working.  Case reviewed with supervising, Dr Duncan Dull, and she and I jointly agreed on management plan.

## 2017-08-04 NOTE — Assessment & Plan Note (Signed)
Controlled today. NO changes to regimen. Advised continued f/u with cardiology, gollan which he verbalized understanding.

## 2017-08-04 NOTE — Assessment & Plan Note (Signed)
No neck pain during exam. Reviewed MRI and MRA with patient from 05/2017 which is reassuring. Specifically no aneurysm, or significant carotid stenosis. Presentation of pain for 2 days , suspect more MS etiology. We agreed to maintain close vigilance and to try conservative therapy. Patient will let me know if doesn't resolve or certainly if any new features.

## 2017-08-04 NOTE — Assessment & Plan Note (Signed)
Uncontrolled. Messaged caroline to see if we can help here. In the meantime, he will start back on prior regimen which he had started with dr Tedd Sias. Insulin pens refilled.

## 2017-08-04 NOTE — Assessment & Plan Note (Addendum)
No evidence of fluid volume overload. Weight down , however I suspect this is from uncontrolled DM

## 2017-08-04 NOTE — Patient Instructions (Addendum)
  Restart insulin regimen and please check blood sugars . Let me know of any hypoglycemic episodes.  Schedule eye exam.   Advise tdap at local pharmacy.   Heat, tylenol for right neck pain. As discussed, suspect musculoskeletal etiology as we discussed  Lab appt at checkout.

## 2017-08-05 ENCOUNTER — Other Ambulatory Visit: Payer: Self-pay

## 2017-08-05 ENCOUNTER — Other Ambulatory Visit (INDEPENDENT_AMBULATORY_CARE_PROVIDER_SITE_OTHER): Payer: 59

## 2017-08-05 DIAGNOSIS — E1159 Type 2 diabetes mellitus with other circulatory complications: Secondary | ICD-10-CM

## 2017-08-05 DIAGNOSIS — I252 Old myocardial infarction: Secondary | ICD-10-CM

## 2017-08-05 DIAGNOSIS — I10 Essential (primary) hypertension: Secondary | ICD-10-CM

## 2017-08-05 DIAGNOSIS — F3170 Bipolar disorder, currently in remission, most recent episode unspecified: Secondary | ICD-10-CM

## 2017-08-05 DIAGNOSIS — Z8673 Personal history of transient ischemic attack (TIA), and cerebral infarction without residual deficits: Secondary | ICD-10-CM

## 2017-08-05 LAB — COMPREHENSIVE METABOLIC PANEL
ALBUMIN: 4 g/dL (ref 3.5–5.2)
ALT: 20 U/L (ref 0–53)
AST: 13 U/L (ref 0–37)
Alkaline Phosphatase: 96 U/L (ref 39–117)
BUN: 11 mg/dL (ref 6–23)
CHLORIDE: 100 meq/L (ref 96–112)
CO2: 24 mEq/L (ref 19–32)
Calcium: 9.3 mg/dL (ref 8.4–10.5)
Creatinine, Ser: 0.65 mg/dL (ref 0.40–1.50)
GFR: 141.81 mL/min (ref >60.00)
Glucose, Bld: 293 mg/dL — ABNORMAL HIGH (ref 70–99)
POTASSIUM: 4 meq/L (ref 3.5–5.1)
SODIUM: 134 meq/L — AB (ref 135–145)
Total Bilirubin: 0.6 mg/dL (ref 0.2–1.2)
Total Protein: 7 g/dL (ref 6.0–8.3)

## 2017-08-05 LAB — CBC WITH DIFFERENTIAL/PLATELET
BASOS PCT: 0.3 % (ref 0.0–3.0)
Basophils Absolute: 0 10*3/uL (ref 0.0–0.1)
EOS PCT: 1.6 % (ref 0.0–5.0)
Eosinophils Absolute: 0.1 10*3/uL (ref 0.0–0.7)
HCT: 43.7 % (ref 39.0–52.0)
HEMOGLOBIN: 14.8 g/dL (ref 13.0–17.0)
Lymphocytes Relative: 35.2 % (ref 12.0–46.0)
Lymphs Abs: 1.9 10*3/uL (ref 0.7–4.0)
MCHC: 34 g/dL (ref 30.0–36.0)
MCV: 89.2 fl (ref 78.0–100.0)
MONO ABS: 0.5 10*3/uL (ref 0.1–1.0)
Monocytes Relative: 9.9 % (ref 3.0–12.0)
Neutro Abs: 2.9 10*3/uL (ref 1.4–7.7)
Neutrophils Relative %: 53 % (ref 43.0–77.0)
Platelets: 227 10*3/uL (ref 150.0–400.0)
RBC: 4.9 Mil/uL (ref 4.22–5.81)
RDW: 13.4 % (ref 11.5–15.5)
WBC: 5.5 10*3/uL (ref 4.0–10.5)

## 2017-08-05 LAB — LIPID PANEL
CHOL/HDL RATIO: 5
Cholesterol: 144 mg/dL (ref 0–200)
HDL: 31.2 mg/dL — ABNORMAL LOW (ref >39.00)
LDL CALC: 83 mg/dL (ref 0–99)
NONHDL: 112.52
TRIGLYCERIDES: 147 mg/dL (ref 0.0–149.0)
VLDL: 29.4 mg/dL (ref 0.0–40.0)

## 2017-08-05 LAB — HEMOGLOBIN A1C: HEMOGLOBIN A1C: 13.4 % — AB (ref 4.6–6.5)

## 2017-08-05 LAB — TSH: TSH: 2.31 u[IU]/mL (ref 0.35–4.50)

## 2017-08-05 LAB — VITAMIN D 25 HYDROXY (VIT D DEFICIENCY, FRACTURES): VITD: 10.57 ng/mL — ABNORMAL LOW (ref 30.00–100.00)

## 2017-08-05 MED ORDER — ATORVASTATIN CALCIUM 80 MG PO TABS: 80.0000 mg | ORAL_TABLET | Freq: Every day | ORAL | 2 refills | Status: DC

## 2017-08-05 MED ORDER — SPIRONOLACTONE 25 MG PO TABS: 12.5000 mg | ORAL_TABLET | Freq: Every day | ORAL | 2 refills | Status: DC

## 2017-08-05 MED ORDER — SERTRALINE HCL 50 MG PO TABS: 50.0000 mg | ORAL_TABLET | Freq: Every day | ORAL | 0 refills | Status: DC

## 2017-08-05 MED ORDER — LISINOPRIL 5 MG PO TABS: 5.0000 mg | ORAL_TABLET | Freq: Every day | ORAL | 2 refills | Status: DC

## 2017-08-05 MED ORDER — DULOXETINE HCL 60 MG PO CPEP: 60.0000 mg | ORAL_CAPSULE | Freq: Every day | ORAL | 0 refills | Status: DC

## 2017-08-05 MED ORDER — CLOPIDOGREL BISULFATE 75 MG PO TABS: 75.0000 mg | ORAL_TABLET | Freq: Every day | ORAL | 2 refills | Status: DC

## 2017-08-05 MED ORDER — METOPROLOL SUCCINATE ER 100 MG PO TB24: 100.0000 mg | ORAL_TABLET | Freq: Every day | ORAL | 2 refills | Status: DC

## 2017-08-05 NOTE — Addendum Note (Signed)
Addended by: Alisia Ferrari on: 08/05/2017 04:52 PM   Modules accepted: Orders

## 2017-08-05 NOTE — Telephone Encounter (Addendum)
Can you approve refill others have been sent , in reviewing chart lantus was 25 units and Humalog 12 units with meals .  Is this correct

## 2017-08-06 MED ORDER — INSULIN LISPRO 100 UNIT/ML (KWIKPEN): 12.0000 [IU] | PEN_INJECTOR | Freq: Three times a day (TID) | SUBCUTANEOUS | 4 refills | Status: DC

## 2017-08-06 MED ORDER — INSULIN GLARGINE 100 UNIT/ML ~~LOC~~ SOLN: 25.0000 [IU] | Freq: Every day | SUBCUTANEOUS | 11 refills | Status: DC

## 2017-08-09 ENCOUNTER — Encounter: Payer: Self-pay | Admitting: Family

## 2017-08-09 ENCOUNTER — Other Ambulatory Visit: Payer: Self-pay | Admitting: Family

## 2017-08-09 DIAGNOSIS — I25118 Atherosclerotic heart disease of native coronary artery with other forms of angina pectoris: Secondary | ICD-10-CM

## 2017-08-09 DIAGNOSIS — E1159 Type 2 diabetes mellitus with other circulatory complications: Secondary | ICD-10-CM

## 2017-08-11 ENCOUNTER — Telehealth: Payer: Self-pay | Admitting: Family

## 2017-08-11 NOTE — Telephone Encounter (Signed)
Call pt  He is having some sob; may be from deconditioning however Im concerned about his h/o HF.  Has he has weight gain?  Please triage,   He needs an appt with [facility] ( cardiologist) asap.

## 2017-08-13 NOTE — Telephone Encounter (Signed)
Spoke with pt he denies any weight gain and states that he "always" is Sob. Pt stated he will make an appointment with his cardiologist asap.

## 2017-08-13 NOTE — Telephone Encounter (Signed)
As discussed, no change from baseline.  He will make an appt with gollan

## 2017-08-13 NOTE — Telephone Encounter (Signed)
See below note  Please call pt today

## 2017-08-27 ENCOUNTER — Encounter: Payer: Self-pay | Admitting: Family

## 2017-08-30 NOTE — Telephone Encounter (Signed)
Spoke with patient he states daughter is only 44 years old not patient her and he states she is feeling better.  I advised Mr Regen that you would not be able to take on as patient.   Patient verbalized understanding.

## 2017-09-01 ENCOUNTER — Encounter: Payer: Self-pay | Admitting: Family

## 2017-09-02 ENCOUNTER — Ambulatory Visit: Payer: 59

## 2017-09-03 ENCOUNTER — Other Ambulatory Visit: Payer: Self-pay | Admitting: Family

## 2017-09-03 DIAGNOSIS — E1159 Type 2 diabetes mellitus with other circulatory complications: Secondary | ICD-10-CM

## 2017-09-03 NOTE — Progress Notes (Signed)
close

## 2017-09-06 ENCOUNTER — Ambulatory Visit: Payer: 59 | Admitting: Family

## 2017-09-06 VITALS — BP 100/78 | HR 88 | Temp 98.4°F | Wt 214.4 lb

## 2017-09-06 DIAGNOSIS — E1159 Type 2 diabetes mellitus with other circulatory complications: Secondary | ICD-10-CM

## 2017-09-06 DIAGNOSIS — I5042 Chronic combined systolic (congestive) and diastolic (congestive) heart failure: Secondary | ICD-10-CM | POA: Diagnosis not present

## 2017-09-06 DIAGNOSIS — F3175 Bipolar disorder, in partial remission, most recent episode depressed: Secondary | ICD-10-CM

## 2017-09-06 LAB — CBC WITH DIFFERENTIAL/PLATELET
BASOS PCT: 0.8 % (ref 0.0–3.0)
Basophils Absolute: 0 10*3/uL (ref 0.0–0.1)
EOS PCT: 2.5 % (ref 0.0–5.0)
Eosinophils Absolute: 0.1 10*3/uL (ref 0.0–0.7)
HCT: 45.3 % (ref 39.0–52.0)
HEMOGLOBIN: 15.5 g/dL (ref 13.0–17.0)
Lymphocytes Relative: 31.2 % (ref 12.0–46.0)
Lymphs Abs: 1.9 10*3/uL (ref 0.7–4.0)
MCHC: 34.2 g/dL (ref 30.0–36.0)
MCV: 88.9 fl (ref 78.0–100.0)
MONO ABS: 0.5 10*3/uL (ref 0.1–1.0)
Monocytes Relative: 8.6 % (ref 3.0–12.0)
NEUTROS ABS: 3.4 10*3/uL (ref 1.4–7.7)
Neutrophils Relative %: 56.9 % (ref 43.0–77.0)
PLATELETS: 238 10*3/uL (ref 150.0–400.0)
RBC: 5.09 Mil/uL (ref 4.22–5.81)
RDW: 13.4 % (ref 11.5–15.5)
WBC: 6 10*3/uL (ref 4.0–10.5)

## 2017-09-06 LAB — COMPREHENSIVE METABOLIC PANEL
ALT: 18 U/L (ref 0–53)
AST: 14 U/L (ref 0–37)
Albumin: 4.3 g/dL (ref 3.5–5.2)
Alkaline Phosphatase: 112 U/L (ref 39–117)
BUN: 16 mg/dL (ref 6–23)
CALCIUM: 9.5 mg/dL (ref 8.4–10.5)
CHLORIDE: 98 meq/L (ref 96–112)
CO2: 25 meq/L (ref 19–32)
CREATININE: 0.79 mg/dL (ref 0.40–1.50)
GFR: 113.18 mL/min (ref >60.00)
Glucose, Bld: 414 mg/dL — ABNORMAL HIGH (ref 70–99)
POTASSIUM: 4.5 meq/L (ref 3.5–5.1)
SODIUM: 132 meq/L — AB (ref 135–145)
Total Bilirubin: 0.6 mg/dL (ref 0.2–1.2)
Total Protein: 7.4 g/dL (ref 6.0–8.3)

## 2017-09-06 MED ORDER — "INSULIN SYRINGE/NEEDLE 28G X 1/2"" 1 ML MISC": 11 refills | Status: DC

## 2017-09-06 NOTE — Assessment & Plan Note (Addendum)
Not on depakote. Concern in h/o of hypomania and hypermania that he is on SSRI and SNRI. He agrees to see psychiatry today for consult. He is not suicidal today. Declines referral to counseling. Close follow up . One month.

## 2017-09-06 NOTE — Assessment & Plan Note (Signed)
No evidence of fluid volume overload at this time.  Again we discussed shortness of breath.  No acute changes; we jointly agreed likely multifactorial including deconditioning.  He is pending a cardiac evaluation with Dr. Mariah Milling in a couple weeks.  Will follow

## 2017-09-06 NOTE — Assessment & Plan Note (Signed)
Not on insulin for over a month, Pending a1c. Has spoken with pharmacist, caroline regarding medicaitons, cost as want to support patient in being compliant. Close followup.

## 2017-09-06 NOTE — Patient Instructions (Signed)
Referral to psychiatry to ensure your medications are safe Please start depakote again  Labs today.     If you start to have unusual thoughts, thoughts of hurting yourself, or anyone else, please go immediately to the emergency department.   Follow up in one month.    National Suicide Prevention Hotline - available 24 hours a day, 7 days a week.  [phone]  Major Depressive Disorder Major depressive disorder is a mental illness. It also may be called clinical depression or unipolar depression. Major depressive disorder usually causes feelings of sadness, hopelessness, or helplessness. Some people with this disorder do not feel particularly sad but lose interest in doing things they used to enjoy (anhedonia). Major depressive disorder also can cause physical symptoms. It can interfere with work, school, relationships, and other normal everyday activities. The disorder varies in severity but is longer lasting and more serious than the sadness we all feel from time to time in our lives. Major depressive disorder often is triggered by stressful life events or major life changes. Examples of these triggers include divorce, loss of your job or home, a move, and the death of a family member or close friend. Sometimes this disorder occurs for no obvious reason at all. People who have family members with major depressive disorder or bipolar disorder are at higher risk for developing this disorder, with or without life stressors. Major depressive disorder can occur at any age. It may occur just once in your life (single episode major depressive disorder). It may occur multiple times (recurrent major depressive disorder). SYMPTOMS People with major depressive disorder have either anhedonia or depressed mood on nearly a daily basis for at least 2 weeks or longer. Symptoms of depressed mood include:  Feelings of sadness (blue or down in the dumps) or emptiness.  Feelings of hopelessness or  helplessness.  Tearfulness or episodes of crying (may be observed by others).  Irritability (children and adolescents). In addition to depressed mood or anhedonia or both, people with this disorder have at least four of the following symptoms:  Difficulty sleeping or sleeping too much.   Significant change (increase or decrease) in appetite or weight.   Lack of energy or motivation.  Feelings of guilt and worthlessness.   Difficulty concentrating, remembering, or making decisions.  Unusually slow movement (psychomotor retardation) or restlessness (as observed by others).   Recurrent wishes for death, recurrent thoughts of self-harm (suicide), or a suicide attempt. People with major depressive disorder commonly have persistent negative thoughts about themselves, other people, and the world. People with severe major depressive disorder may experiencedistorted beliefs or perceptions about the world (psychotic delusions). They also may see or hear things that are not real (psychotic hallucinations). DIAGNOSIS Major depressive disorder is diagnosed through an assessment by your health care provider. Your health care provider will ask aboutaspects of your daily life, such as mood,sleep, and appetite, to see if you have the diagnostic symptoms of major depressive disorder. Your health care provider may ask about your medical history and use of alcohol or drugs, including prescription medicines. Your health care provider also may do a physical exam and blood work. This is because certain medical conditions and the use of certain substances can cause major depressive disorder-like symptoms (secondary depression). Your health care provider also may refer you to a mental health specialist for further evaluation and treatment. TREATMENT It is important to recognize the symptoms of major depressive disorder and seek treatment. The following treatments can be prescribed for this  disorder:    Medicine. Antidepressant medicines usually are prescribed. Antidepressant medicines are thought to correct chemical imbalances in the brain that are commonly associated with major depressive disorder. Other types of medicine may be added if the symptoms do not respond to antidepressant medicines alone or if psychotic delusions or hallucinations occur.  Talk therapy. Talk therapy can be helpful in treating major depressive disorder by providing support, education, and guidance. Certain types of talk therapy also can help with negative thinking (cognitive behavioral therapy) and with relationship issues that trigger this disorder (interpersonal therapy). A mental health specialist can help determine which treatment is best for you. Most people with major depressive disorder do well with a combination of medicine and talk therapy. Treatments involving electrical stimulation of the brain can be used in situations with extremely severe symptoms or when medicine and talk therapy do not work over time. These treatments include electroconvulsive therapy, transcranial magnetic stimulation, and vagal nerve stimulation.   This information is not intended to replace advice given to you by your health care provider. Make sure you discuss any questions you have with your health care provider.   Document Released: 09/12/2012 Document Revised: 06/08/2014 Document Reviewed: 09/12/2012 Elsevier Interactive Patient Education [employer]

## 2017-09-06 NOTE — Progress Notes (Signed)
Subjective:    Patient ID: Jose Merritt, male    DOB: Dec 13, 1975, 42 y.o.   MRN: [id number]  CC: [name] is a 42 y.o. male who presents today for follow up.   HPI: DM- not taking insulin. Wants quikpen. Not using vial. Frustrated as cannot get medtronic pump and cgm.   Bipolar- not taking depakote. Has bad days. Today is a better day and he is picking up his daughter from school this afternoon. No SI/HI. Lives for his daughter and wife. Loves them very much.   CHF- No increase of SOB. No weight gain, le edema. Hasn't had to use nitro.   Pending appointment with [facility] 4/23.     HISTORY:  Past Medical History:  Diagnosis Date  . Asthma   . Bipolar disorder (HCC)   . CHF (congestive heart failure) (HCC)   . Chicken pox   . Clotting disorder (HCC)    lung  . Depression   . Diabetes mellitus without complication (HCC) 2003   Type 2  . Heart murmur    as child  . Hyperlipidemia   . Hypertension   . Myocardial infarction (HCC) 06/2015  . Retinopathy of both eyes 01/2016  . SOB (shortness of breath) 08/17/2016  . Stroke [facility])    Past Surgical History:  Procedure Laterality Date  . CORONARY ANGIOPLASTY WITH STENT PLACEMENT Left 06/2015   LADx1 stent  . WISDOM TOOTH EXTRACTION     Family History  Problem Relation Age of Onset  . Hypertension Mother   . Arthritis Mother   . Heart disease Father        3 heart attacks  . Diabetes Father   . Hypertension Father   . Hyperlipidemia Father   . Alcohol abuse Father   . Hypertension Brother   . Hyperlipidemia Brother   . Heart disease Maternal Uncle   . Heart disease Paternal Aunt   . Heart disease Maternal Grandfather   . Hyperlipidemia Maternal Grandfather   . Diabetes Maternal Grandfather   . Kidney disease Maternal Grandfather   . Heart disease Paternal Grandmother   . Kidney disease Paternal Grandmother   . Hypertension Maternal Grandmother   . Kidney disease Maternal Grandmother   .  Arthritis Maternal Grandmother   . Kidney disease Paternal Grandfather     Allergies: Metrizamide; Other; Sulfa antibiotics; and Contrast media [iodinated diagnostic agents] Current Outpatient Medications on File Prior to Visit  Medication Sig Dispense Refill  . aspirin 81 MG chewable tablet Chew by mouth daily.    Marland Kitchen atorvastatin (LIPITOR) 80 MG tablet Take 1 tablet (80 mg total) by mouth daily. qam 90 tablet 2  . clopidogrel (PLAVIX) 75 MG tablet Take 1 tablet (75 mg total) by mouth daily. qam 90 tablet 2  . DULoxetine (CYMBALTA) 60 MG capsule Take 1 capsule (60 mg total) by mouth daily. qam 90 capsule 0  . insulin lispro (HUMALOG) 100 UNIT/ML KiwkPen Inject 0.12 mLs (12 Units total) into the skin 3 (three) times daily with meals. 15 mL 4  . Insulin Pen Needle 33G X 5 MM MISC 1 application by Does not apply route 4 (four) times daily as needed. 100 each 3  . lisinopril (PRINIVIL,ZESTRIL) 5 MG tablet Take 1 tablet (5 mg total) by mouth daily. qam 90 tablet 2  . metoprolol succinate (TOPROL-XL) 100 MG 24 hr tablet Take 1 tablet (100 mg total) by mouth daily. Take with or immediately following a meal. qam 90 tablet 2  .  nitroGLYCERIN (NITROSTAT) 0.3 MG SL tablet Place 1 tablet (0.3 mg total) under the tongue every 5 (five) minutes as needed. 90 tablet 0  . sertraline (ZOLOFT) 50 MG tablet Take 1 tablet (50 mg total) by mouth daily. 90 tablet 0  . spironolactone (ALDACTONE) 25 MG tablet Take 0.5 tablets (12.5 mg total) by mouth daily. qam 45 tablet 2  . divalproex (DEPAKOTE ER) 500 MG 24 hr tablet Take 2 tablets (1,000 mg total) by mouth every morning. (Patient not taking: Reported on 09/06/2017) 180 tablet 1  . insulin glargine (LANTUS) 100 UNIT/ML injection Inject 0.25 mLs (25 Units total) into the skin daily. Increase by 1 units/day until fasting BS are < 150 (Patient not taking: Reported on 09/06/2017) 10 mL 11   No current facility-administered medications on file prior to visit.     Social  History   Tobacco Use  . Smoking status: Never Smoker  . Smokeless tobacco: Never Used  Substance Use Topics  . Alcohol use: No    Comment: rare- 2x/year  . Drug use: No    Review of Systems  Constitutional: Negative for chills and fever.  Respiratory: Negative for cough.   Cardiovascular: Negative for chest pain and palpitations.  Gastrointestinal: Negative for nausea and vomiting.  Psychiatric/Behavioral: Negative for suicidal ideas.      Objective:    BP 100/78 (BP Location: Left Arm, Patient Position: Sitting, Cuff Size: Normal)   Pulse 88   Temp 98.4 F (36.9 C) (Oral)   Wt 214 lb 6 oz (97.2 kg)   SpO2 98%   BMI 29.07 kg/m  BP Readings from Last 3 Encounters:  09/06/17 100/78  08/04/17 114/74  05/01/17 110/66   Wt Readings from Last 3 Encounters:  09/06/17 214 lb 6 oz (97.2 kg)  08/04/17 219 lb 6 oz (99.5 kg)  05/01/17 232 lb 1.6 oz (105.3 kg)    Physical Exam  Constitutional: He appears well-developed and well-nourished.  Cardiovascular: Regular rhythm and normal heart sounds.  No le edema.  Pulmonary/Chest: Effort normal and breath sounds normal. No respiratory distress. He has no wheezes. He has no rhonchi. He has no rales.  Lymphadenopathy:       Head (left side): No submandibular and no preauricular adenopathy present.  Neurological: He is alert.  Skin: Skin is warm and dry.  Psychiatric: He has a normal mood and affect. His speech is normal and behavior is normal.  Vitals reviewed.      Assessment & Plan:   Problem List Items Addressed This Visit      Cardiovascular and Mediastinum   Type 2 diabetes mellitus with circulatory disorder (HCC) - Primary    Not on insulin for over a month, Pending a1c. Has spoken with pharmacist, caroline regarding medicaitons, cost as want to support patient in being compliant. Close followup.       Relevant Orders   Comprehensive metabolic panel   CBC with Differential/Platelet   Chronic combined systolic and  diastolic CHF (congestive heart failure) (HCC)    No evidence of fluid volume overload at this time.  Again we discussed shortness of breath.  No acute changes; we jointly agreed likely multifactorial including deconditioning.  He is pending a cardiac evaluation with Dr. Mariah Milling in a couple weeks.  Will follow        Other   Bipolar disorder, in partial remission, most recent episode depressed (HCC)     Not on depakote. Concern in h/o of hypomania and hypermania that he is  on SSRI and SNRI. He agrees to see psychiatry today for consult. He is not suicidal today. Declines referral to counseling. Close follow up . One month.       Relevant Orders   Ambulatory referral to Psychiatry       I am having Vania Rea. Janee Morn maintain his aspirin, nitroGLYCERIN, divalproex, Insulin Pen Needle, atorvastatin, clopidogrel, DULoxetine, lisinopril, metoprolol succinate, spironolactone, sertraline, insulin lispro, insulin glargine, and INS SYRINGE/NEEDLE 1CC/28G.   Meds ordered this encounter  Medications  . INS SYRINGE/NEEDLE 1CC/28G (B-D INSULIN SYRINGE 1CC/28G) 28G X 1/2" 1 ML MISC    Sig: Use daily for insulin administration    Dispense:  100 each    Refill:  11    Return precautions given.   Risks, benefits, and alternatives of the medications and treatment plan prescribed today were discussed, and patient expressed understanding.   Education regarding symptom management and diagnosis given to patient on AVS.  Continue to follow with Allegra Grana, FNP for routine health maintenance.   Dixie Dials and I agreed with plan.   [name], FNP

## 2017-09-07 ENCOUNTER — Other Ambulatory Visit: Payer: Self-pay | Admitting: *Deleted

## 2017-09-07 ENCOUNTER — Other Ambulatory Visit: Payer: Self-pay | Admitting: Family

## 2017-09-07 DIAGNOSIS — I10 Essential (primary) hypertension: Secondary | ICD-10-CM

## 2017-09-07 NOTE — Patient Outreach (Signed)
[facility]) Care Management  09/07/2017  Jose Merritt 02/13/1976 [id number]   Subjective: Telephone call to patient's home  / mobile number, no answer, left HIPAA compliant voicemail message, and requested call back.    Objective: Per KPN (Knowledge Performance Now, point of care tool) and chart review, patient hospitalized 04/30/17 -05/01/17 for TIA (transient ischemic attack) and transition of care follow up completed by [facility] Care Management [name] RNCM on 05/04/17.   Patient also has a history of hypertension, hyperlipidemia, diabetes, Bipolar disorder , CHF (congestive heart failure), heart murmur, Myocardial infarction , clotting disorder, and Asthma.       Assessment:  Received [facility] Consult referral from primary provider on 09/03/17.  Referral for medication cost,  disease management of diabetes and heart failure.     [facility] Consult  follow up pending patient contact.       Plan: RNCM will call patient for 2nd telephone outreach attempt, [facility] Consult follow up, within 10 business days if no return call.      Marguerite Barba H. Gardiner Barefoot, BSN, CCM [facility] Care Management [facility] Telephonic CM Phone: [phone] Fax: [phone]

## 2017-09-08 ENCOUNTER — Ambulatory Visit: Payer: Self-pay | Admitting: *Deleted

## 2017-09-08 ENCOUNTER — Other Ambulatory Visit: Payer: Self-pay | Admitting: *Deleted

## 2017-09-08 NOTE — Patient Outreach (Addendum)
[facility]) Care Management  09/08/2017  Jose Merritt 08/08/1975 [id number]  Subjective: Telephone call to patient's home  / mobile number, no answer, left HIPAA compliant voicemail message, and requested call back. Telephone call to patient's home / mobile number, spoke with patient, and HIPAA verified.  Discussed [facility] Care Management [facility] Consult follow up, patient voiced understanding, and is in agreement to follow up.  Patient states he is doing well, has started back on insulin per MD order as of 09/07/17, blood sugar decreased from 415 - 380,  has not taken blood sugar today, will take reading shortly, and follow treatment plan as prescribed.  States he is currently active with [facility] [employer] [facility] Disease Management program, received calls from [facility], and his next follow up with Corrie Dandy is on 09/23/17.  States Corrie Dandy at [employer] is aware of his medication assistance needs, has sent an email to [employer], and insulin pump provider to attempt to resolve the issues.  States his primary provider is aware that he is currently engaged with the [employer] disease management program.   Patient states he is able to manage self care and has assistance as needed.   Patient voices understanding of medical diagnosis and treatment plan.   States he is accessing the following [facility] benefits: outpatient pharmacy, and hospital indemnity (not sure if chosen, will ask wife to verify, will file claim for past hospitalization if appropriate).  States he is not currently working, and wife is planning to look into filing for disability for him.   Patient states he does not have any [facility] Care Management needs at this time and will continue to follow up with [facility] [employer] [facility].  Patient is in agreement to receive [facility] Care Management transition of care follow up if needed in the future.   States he is very appreciative of the follow up and is in agreement to receive  [facility] Care Management information.  Patient states he is aware that [facility] Care Management [name] program is no longer available, the program was very helpful to him by meeting face to face with [facility], keeping him healthy, the program helped him to stay on track with his health, and his health has deteriorated since he longer has face to face interaction with the nurse.  Patient requested that this RNCM share his concerns with [facility] Care Management leadership team.       Objective: Per KPN (Knowledge Performance Now, point of care tool) and chart review, patient hospitalized 04/30/17 -05/01/17 for TIA (transient ischemic attack) and transition of care follow up completed by [facility] Care Management [name] RNCM on 05/04/17.   Patient also has a history of hypertension, hyperlipidemia, diabetes, Bipolar disorder , CHF (congestive heart failure), heart murmur, Myocardial infarction , clotting disorder, and Asthma.        Assessment:  Received [facility] Consult referral from primary provider on 09/03/17.  Referral for medication cost,  disease management of diabetes and heart failure.     [facility] Consult  follow up completed, no additional [facility] Care Management follow up needed and will proceed with case closure.       Plan: RNCM will send patient successful outreach letter, [facility] pamphlet, and magnet. RNCM will complete case closure due to follow up completed / no care management needs.  RNCM will send email regarding patient concerns to [facility] Care Management leadership team, per patient's request.  RNCM will send referral source [facility] inbasket  message patient status update.       Ishia Tenorio H. Gardiner Barefoot, BSN, CCM [facility] Care Management [facility] Telephonic CM Phone: [phone] Fax: [phone]

## 2017-09-15 ENCOUNTER — Encounter: Payer: Self-pay | Admitting: Family

## 2017-09-20 ENCOUNTER — Encounter: Payer: Self-pay | Admitting: Psychiatry

## 2017-09-20 ENCOUNTER — Other Ambulatory Visit: Payer: Self-pay

## 2017-09-20 ENCOUNTER — Ambulatory Visit: Payer: 59 | Admitting: Psychiatry

## 2017-09-20 VITALS — BP 112/74 | HR 86 | Temp 97.9°F | Ht 72.0 in | Wt 214.2 lb

## 2017-09-20 DIAGNOSIS — G4700 Insomnia, unspecified: Secondary | ICD-10-CM | POA: Diagnosis not present

## 2017-09-20 DIAGNOSIS — F3162 Bipolar disorder, current episode mixed, moderate: Secondary | ICD-10-CM

## 2017-09-20 MED ORDER — HYDROXYZINE PAMOATE 25 MG PO CAPS: 25.0000 mg | ORAL_CAPSULE | Freq: Two times a day (BID) | ORAL | 1 refills | Status: DC | PRN

## 2017-09-20 MED ORDER — DULOXETINE HCL 30 MG PO CPEP: 30.0000 mg | ORAL_CAPSULE | Freq: Every day | ORAL | 1 refills | Status: DC

## 2017-09-20 NOTE — Patient Instructions (Signed)
Hydroxyzine capsules or tablets What is this medicine? HYDROXYZINE (hye DROX i zeen) is an antihistamine. This medicine is used to treat allergy symptoms. It is also used to treat anxiety and tension. This medicine can be used with other medicines to induce sleep before surgery. This medicine may be used for other purposes; ask your health care provider or pharmacist if you have questions. COMMON BRAND NAME(S): ANX, Atarax, Rezine, Vistaril What should I tell my health care provider before I take this medicine? They need to know if you have any of these conditions: -any chronic illness -difficulty passing urine -glaucoma -heart disease -kidney disease -liver disease -lung disease -an unusual or allergic reaction to hydroxyzine, cetirizine, other medicines, foods, dyes, or preservatives -pregnant or trying to get pregnant -breast-feeding How should I use this medicine? Take this medicine by mouth with a full glass of water. Follow the directions on the prescription label. You may take this medicine with food or on an empty stomach. Take your medicine at regular intervals. Do not take your medicine more often than directed. Talk to your pediatrician regarding the use of this medicine in children. Special care may be needed. While this drug may be prescribed for children as young as 6 years of age for selected conditions, precautions do apply. Patients over 65 years old may have a stronger reaction and need a smaller dose. Overdosage: If you think you have taken too much of this medicine contact a poison control center or emergency room at once. NOTE: This medicine is only for you. Do not share this medicine with others. What if I miss a dose? If you miss a dose, take it as soon as you can. If it is almost time for your next dose, take only that dose. Do not take double or extra doses. What may interact with this medicine? -alcohol -barbiturate medicines for sleep or seizures -medicines for  colds, allergies -medicines for depression, anxiety, or emotional disturbances -medicines for pain -medicines for sleep -muscle relaxants This list may not describe all possible interactions. Give your health care provider a list of all the medicines, herbs, non-prescription drugs, or dietary supplements you use. Also tell them if you smoke, drink alcohol, or use illegal drugs. Some items may interact with your medicine. What should I watch for while using this medicine? Tell your doctor or health care professional if your symptoms do not improve. You may get drowsy or dizzy. Do not drive, use machinery, or do anything that needs mental alertness until you know how this medicine affects you. Do not stand or sit up quickly, especially if you are an older patient. This reduces the risk of dizzy or fainting spells. Alcohol may interfere with the effect of this medicine. Avoid alcoholic drinks. Your mouth may get dry. Chewing sugarless gum or sucking hard candy, and drinking plenty of water may help. Contact your doctor if the problem does not go away or is severe. This medicine may cause dry eyes and blurred vision. If you wear contact lenses you may feel some discomfort. Lubricating drops may help. See your eye doctor if the problem does not go away or is severe. If you are receiving skin tests for allergies, tell your doctor you are using this medicine. What side effects may I notice from receiving this medicine? Side effects that you should report to your doctor or health care professional as soon as possible: -fast or irregular heartbeat -difficulty passing urine -seizures -slurred speech or confusion -tremor Side effects that   usually do not require medical attention (report to your doctor or health care professional if they continue or are bothersome): -constipation -drowsiness -fatigue -headache -stomach upset This list may not describe all possible side effects. Call your doctor for  medical advice about side effects. You may report side effects to FDA at 1-800-FDA-1088. Where should I keep my medicine? Keep out of the reach of children. Store at room temperature between 15 and 30 degrees C (59 and 86 degrees F). Keep container tightly closed. Throw away any unused medicine after the expiration date. NOTE: This sheet is a summary. It may not cover all possible information. If you have questions about this medicine, talk to your doctor, pharmacist, or health care provider.  2018 Elsevier/Gold Standard (2007-09-30 14:50:59)  

## 2017-09-20 NOTE — Progress Notes (Signed)
Psychiatric Initial Adult Assessment   Patient Identification: Jose Merritt MRN:  [id number] Date of Evaluation:  09/20/2017 Referral Source: Margerett Arnette FNP Chief Complaint:  ' I want to establish care." Chief Complaint    Establish Care; Depression; Manic Behavior     Visit Diagnosis:    ICD-10-CM   1. Bipolar 1 disorder, mixed, moderate (HCC) F31.62   2. Insomnia, unspecified type G47.00     History of Present Illness:  Jose Merritt is a 42 year old Caucasian male, married, lives in [place], unemployed, has a history of bipolar disorder, sleep issues, multiple medical problems including diabetes mellitus, congestive heart failure, diabetic neuropathy and so on, presented to the clinic today to establish care.  Jose Merritt reports a previous diagnosis of bipolar disorder.  He reports he started struggling with depression and mood lability around the age of 42 years old.  Patient did go to psychiatrist as well as psychotherapist in the past however he was not compliant.  Patient reports most recently he is on Zoloft, Cymbalta.  He was also restarted on Depakote by his primary medical doctor 2 weeks ago.  He describes his symptoms as mood lability, irritability, sadness, anhedonia and so on.  He reports his symptoms can come and go several times a week.  He felt his symptoms as worsening and that is why his provider started him back on the Depakote.  When he took the Depakote in the past it helped him.  Patient reports since after starting the Depakote he has not noticed much changes in his mood.  He takes 1000 mg daily.  He has not had a Depakote level done yet.  He also struggles with sleep.  He reports he has difficulty maintaining sleep.  He has tried medications like trazodone and gabapentin in the past.  He reports that gabapentin may have helped him to some extent with  sleep however he required 1500 mg daily.  He however reports he was taken off of it since he used more than  what was prescribed to him by his providers.  Patient reports fleeting suicidal thoughts on and off.  He currently denies any suicidal thoughts but he does report recent thoughts.  He denies having any plan.  He does have a history of suicide attempt 6 years ago.  He reports that was before his daughter was born.  His daughter is currently 38 years old.  He also had another attempt prior to that.  He reports he overdosed on medications the first time and the second time on insulin.  He does report history of anger issues and manic symptoms.  He does report periods when he stays up for 1-2 days and spends money impulsively and takes up multiple projects and so on.  The last time he had an episode was 3 weeks ago.  Patient denies any history of trauma.  Patient denies any perceptual disturbances.  Pt denies abusing any drugs or alcohol.  Patient reports psychosocial stressors of being unemployed as well as having to live with multiple medical problems like congestive heart failure as well as uncontrolled diabetes.  Patient reports his wife as supportive.  He also reports his daughter who is 94 years old as a positive factor in his life.  Collateral information was obtained from patient's wife Ms. Doreene Burke at [phone].  Discussed crisis plan with patient as well as wife.  Also discussed about the gun at home.   Patient agrees to discuss this with his wife when he gets home  to come up with a safer plan.  Also discussed to get immediate help if patient has worsening suicidal thoughts.      Associated Signs/Symptoms: Depression Symptoms:  depressed mood, insomnia, anxiety, (Hypo) Manic Symptoms:  Distractibility, Irritable Mood, Labiality of Mood, Anxiety Symptoms:  unspecified worry Psychotic Symptoms:  denies PTSD Symptoms: Negative  Past Psychiatric History: Patient with history of bipolar disorder as well as major depressive disorder.  Patient had inpatient behavioral health  admission at [facility] 6 years ago after a suicide attempt.  Patient has seen a psychiatrist  in the past.  Patient also has been to therapist in the past.  Patient has had 2 suicide attempts in the past.  Previous Psychotropic Medications: Yes , past trials of medications like Depakote, trazodone, Wellbutrin, Celexa.  Substance Abuse History in the last 12 months:  No.  Consequences of Substance Abuse: Negative  Past Medical History:  Past Medical History:  Diagnosis Date  . Asthma   . Bipolar disorder (HCC)   . CHF (congestive heart failure) (HCC)   . Chicken pox   . Clotting disorder (HCC)    lung  . Depression   . Diabetes mellitus without complication (HCC) 2003   Type 2  . Heart murmur    as child  . Hyperlipidemia   . Hypertension   . Myocardial infarction (HCC) 06/2015  . Retinopathy of both eyes 01/2016  . SOB (shortness of breath) 08/17/2016  . Stroke [facility])     Past Surgical History:  Procedure Laterality Date  . CORONARY ANGIOPLASTY WITH STENT PLACEMENT Left 06/2015   LADx1 stent  . WISDOM TOOTH EXTRACTION      Family Psychiatric History: Father-alcoholic.  Family History:  Family History  Problem Relation Age of Onset  . Hypertension Mother   . Arthritis Mother   . Heart disease Father        3 heart attacks  . Diabetes Father   . Hypertension Father   . Hyperlipidemia Father   . Alcohol abuse Father   . Hypertension Brother   . Hyperlipidemia Brother   . Heart disease Maternal Uncle   . Heart disease Paternal Aunt   . Heart disease Maternal Grandfather   . Hyperlipidemia Maternal Grandfather   . Diabetes Maternal Grandfather   . Kidney disease Maternal Grandfather   . Heart disease Paternal Grandmother   . Kidney disease Paternal Grandmother   . Hypertension Maternal Grandmother   . Kidney disease Maternal Grandmother   . Arthritis Maternal Grandmother   . Kidney disease Paternal Grandfather     Social History:   Social History    Socioeconomic History  . Marital status: Married    Spouse name: Hale Hartigan  . Number of children: 1  . Years of education: Not on file  . Highest education level: Associate degree: occupational, Scientist, product/process development, or vocational program  Occupational History  . 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 Financial resource strain: Not hard at all  . Food insecurity:    Worry: Never true    Inability: Never true  . Transportation needs:    Medical: No    Non-medical: No  Tobacco Use  . Smoking status: Never Smoker  . Smokeless tobacco: Never Used  Substance and Sexual Activity  . Alcohol use: No    Comment: rare- 2x/year  . Drug use: No  . Sexual activity: Yes    Comment: impotence  post heart attack  Lifestyle  . Physical activity:    Days per week: 0 days    Minutes per session: 0 min  . Stress: Not at all  Relationships  . Social connections:    Talks on phone: More than three times a week    Gets together: Once a week    Attends religious service: More than 4 times per year    Active member of club or organization: No    Attends meetings of clubs or organizations: Never    Relationship status: Married  Other Topics Concern  . 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  He is unemployed.  He has a 18-year-old daughter whose name is [name].  He reports his daughter as a major positive factor in his life.  His wife is supportive.  Patient graduated high school and also completed a course in Personnel officer.  He used to work in the past however stopped working 5 years ago in order to take care of his daughter. Patient was raised by  mother and father up until the age of 32 years old.  His parents divorced around that time.  His mental health issues also started around that time.  Allergies:    Allergies  Allergen Reactions  . Metrizamide Anaphylaxis  . Other Anaphylaxis    Shrimp  . Sulfa Antibiotics Anaphylaxis    Uncoded Allergy. Allergen: CT contrast dye. Tolerated contrast for heart catheterization 07/2015  . Contrast Media [Iodinated Diagnostic Agents]     Metabolic Disorder Labs: Lab Results  Component Value Date   HGBA1C 13.4 (H) 08/05/2017   No results found for: PROLACTIN Lab Results  Component Value Date   CHOL 144 08/05/2017   TRIG 147.0 08/05/2017   HDL 31.20 (L) 08/05/2017   CHOLHDL 5 08/05/2017   VLDL 29.4 08/05/2017   LDLCALC 83 08/05/2017   LDLCALC 70 05/01/2017     Current Medications: Current Outpatient Medications  Medication Sig Dispense Refill  . aspirin 81 MG chewable tablet Chew by mouth daily.    Marland Kitchen atorvastatin (LIPITOR) 80 MG tablet Take 1 tablet (80 mg total) by mouth daily. qam 90 tablet 2  . clopidogrel (PLAVIX) 75 MG tablet Take 1 tablet (75 mg total) by mouth daily. qam 90 tablet 2  . divalproex (DEPAKOTE ER) 500 MG 24 hr tablet Take 2 tablets (1,000 mg total) by mouth every morning. 180 tablet 1  . INS SYRINGE/NEEDLE 1CC/28G (B-D INSULIN SYRINGE 1CC/28G) 28G X 1/2" 1 ML MISC Use daily for insulin administration 100 each 11  . insulin glargine (LANTUS) 100 UNIT/ML injection Inject 0.25 mLs (25 Units total) into the skin daily. Increase by 1 units/day until fasting BS are < 150 10 mL 11  . insulin lispro (HUMALOG) 100 UNIT/ML KiwkPen Inject 0.12 mLs (12 Units total) into the skin 3 (three) times daily with meals. 15 mL 4  . Insulin Pen Needle 33G X 5 MM MISC 1 application by Does not apply route 4 (four) times daily as needed. 100 each 3  . lisinopril (PRINIVIL,ZESTRIL) 5 MG tablet Take 1 tablet (5 mg total) by mouth daily. qam 90 tablet 2  . metoprolol succinate (TOPROL-XL) 100 MG 24 hr tablet Take 1 tablet (100 mg total) by mouth daily. Take with or  immediately following a meal. qam 90 tablet 2  . nitroGLYCERIN (NITROSTAT) 0.3 MG SL  tablet Place 1 tablet (0.3 mg total) under the tongue every 5 (five) minutes as needed. 90 tablet 0  . sertraline (ZOLOFT) 50 MG tablet Take 1 tablet (50 mg total) by mouth daily. 90 tablet 0  . spironolactone (ALDACTONE) 25 MG tablet Take 0.5 tablets (12.5 mg total) by mouth daily. qam 45 tablet 2  . DULoxetine (CYMBALTA) 30 MG capsule Take 1 capsule (30 mg total) by mouth daily. 30 capsule 1  . hydrOXYzine (VISTARIL) 25 MG capsule Take 1-2 capsules (25-50 mg total) by mouth 2 (two) times daily as needed (FOR ANXIETY, SLEEP). 120 capsule 1   No current facility-administered medications for this visit.     Neurologic: Headache: No Seizure: No Paresthesias:No  Musculoskeletal: Strength & Muscle Tone: within normal limits Gait & Station: normal Patient leans: N/A  Psychiatric Specialty Exam: Review of Systems  Psychiatric/Behavioral: Positive for depression. The patient is nervous/anxious and has insomnia.   All other systems reviewed and are negative.   Blood pressure 112/74, pulse 86, temperature 97.9 F (36.6 C), temperature source Oral, height 6' (1.829 m), weight 214 lb 3.2 oz (97.2 kg).Body mass index is 29.05 kg/m.  General Appearance: Casual  Eye Contact:  Fair  Speech:  Clear and Coherent  Volume:  Normal  Mood:  Anxious and Dysphoric  Affect:  Congruent  Thought Process:  Goal Directed and Descriptions of Associations: Intact  Orientation:  Full (Time, Place, and Person)  Thought Content:  Logical  Suicidal Thoughts:  No  Homicidal Thoughts:  No  Memory:  Immediate;   Fair Recent;   Fair Remote;   Fair  Judgement:  Fair  Insight:  Fair  Psychomotor Activity:  Normal  Concentration:  Concentration: Fair and Attention Span: Fair  Recall:  [employer] of Knowledge:Fair  Language: Fair  Akathisia:  No  Handed:  Right  AIMS (if indicated):  na  Assets:  Communication Skills Desire for Improvement Housing Social Support Talents/Skills Transportation  ADL's:   Intact  Cognition: WNL  Sleep:  poor    Treatment Plan Summary:Jose Merritt is a 42 year old Caucasian male who has a history of bipolar disorder, insomnia, multiple medical problems including congestive heart failure, diabetes mellitus, neuropathy, presented to the clinic today to establish care.  Patient was recently started back on Depakote.  Patient has psychosocial stressors of being unemployed as well as his multiple medical issues which are chronic.  Patient also was noncompliant on his medications up until recently for his bipolar disorder.  Patient does have a history of suicidality as well as suicide attempts.  Hence crisis plan was discussed with patient as well as wife.  Discussed medication changes as well as referral for psychotherapy with patient.  Plan as noted below. Medication management and Plan as noted below  Plan  For bipolar disorder Continue Depakote ER 1000 mg p.o. daily.  Discussed with patient to take it at bedtime instead of morning. Continue Zoloft 50 mg p.o. daily.  He reports he was started on Zoloft a year ago by Dr. Edwin Dada. Reduce Cymbalta to 30 mg p.o. daily.  He does not know if it is really working and does not know if it is effective with his neuropathy.  Discussed with him the risk of being on SSRI/SNRI combination, risk of serotonin syndrome and so on.  For insomnia Add hydroxyzine 25-50 mg p.o. nightly as needed  For anxiety symptoms Add hydroxyzine 25  mg p.o. twice daily as needed  Will refer patient for CBT, will send referral to Ms. Felecia Jan.  We will get the following labs, Depakote level since he is on Depakote, also he has a history of hyponatremia and hence we will get his BMP checked.  Discussed with patient about the effect of psychotropic medications like Depakote as well as his SSRIs which can have an impact on his sodium level.  Patient also has multiple medical problems which could be contributing to it.  We will obtain medical records from  Dr. Edwin Dada.  Patient to sign a release.  Follow-up in clinic in 10 days or sooner if needed.  More than 50 % of the time was spent for psychoeducation and supportive psychotherapy and care coordination.  This note was generated in part or whole with voice recognition software. Voice recognition is usually quite accurate but there are transcription errors that can and very often do occur. I apologize for any typographical errors that were not detected and corrected.     Jomarie Longs, MD 4/22/20198:10 PM

## 2017-09-22 NOTE — Progress Notes (Deleted)
Cardiology Office Note  Date:  09/22/2017   ID:  Saw, Jose Merritt 1975-11-12, MRN [id number]  PCP:  Allegra Grana, FNP   No chief complaint on file.   HPI:  [name] is a pleasant 42 year old gentlemanwith history of CAD, Stent to LAD 06/2015, initially presented with shoulder painneck pain, shortness of breath/anginal equivalent DM II, HBA1C 9.3 Bipolar/depression HTN Hyperlipidemia Obesity Who presents to establish care in the [place] office, management of his coronary disease  Review of records shows Admitted 1/31-07/11/2015 for STEMI. 1 week prior to presentation he had left shoulder pain   treated at urgent care for bursitis with a steroid injection.   In the few days prior to presentation,   EKG with ST elevation. Cath with occluded mid LAD treated with DES. No residual disease.   Echo with ejection fraction 20% with probable LV thrombus. Started on warfarin. Metoprolol succinate, atorvastatin and lisinopril also initiated.   08/29/15 Repeat Cardiac cath, stable , stents patent Echocardiogram with improved ejection fraction (40%) and resolution of LV apical thrombus. Lifevest stopped Warfarin stopped, but he continued to take  In terms of his diabetes, he is followed by endocrinologist, Dr Jose Merritt with DM nutritionist  Bipolar-he has seen psychiatrist;  Previous echocardiogram March 2017, ejection fraction 40%, up from 19%  On beta blocker, plavix, lipitor, Aspirin, Aldactone, lisinopril Denies significant chest pain, chronic shortness of breath on exertion since the heart attack  EKG personally reviewed by myself on todays visit Shows normal sinus rhythm rate 74 bpm old anterior MI   PMH:   has a past medical history of Asthma, Bipolar disorder (HCC), CHF (congestive heart failure) (HCC), Chicken pox, Clotting disorder (HCC), Depression, Diabetes mellitus without complication (HCC) (2003), Heart murmur, Hyperlipidemia, Hypertension,  Myocardial infarction (HCC) (06/2015), Retinopathy of both eyes (01/2016), SOB (shortness of breath) (08/17/2016), and Stroke (HCC).  PSH:    Past Surgical History:  Procedure Laterality Date  . CORONARY ANGIOPLASTY WITH STENT PLACEMENT Left 06/2015   LADx1 stent  . WISDOM TOOTH EXTRACTION      Current Outpatient Medications  Medication Sig Dispense Refill  . aspirin 81 MG chewable tablet Chew by mouth daily.    Marland Kitchen atorvastatin (LIPITOR) 80 MG tablet Take 1 tablet (80 mg total) by mouth daily. qam 90 tablet 2  . clopidogrel (PLAVIX) 75 MG tablet Take 1 tablet (75 mg total) by mouth daily. qam 90 tablet 2  . divalproex (DEPAKOTE ER) 500 MG 24 hr tablet Take 2 tablets (1,000 mg total) by mouth every morning. 180 tablet 1  . DULoxetine (CYMBALTA) 30 MG capsule Take 1 capsule (30 mg total) by mouth daily. 30 capsule 1  . hydrOXYzine (VISTARIL) 25 MG capsule Take 1-2 capsules (25-50 mg total) by mouth 2 (two) times daily as needed (FOR ANXIETY, SLEEP). 120 capsule 1  . INS SYRINGE/NEEDLE 1CC/28G (B-D INSULIN SYRINGE 1CC/28G) 28G X 1/2" 1 ML MISC Use daily for insulin administration 100 each 11  . insulin glargine (LANTUS) 100 UNIT/ML injection Inject 0.25 mLs (25 Units total) into the skin daily. Increase by 1 units/day until fasting BS are < 150 10 mL 11  . insulin lispro (HUMALOG) 100 UNIT/ML KiwkPen Inject 0.12 mLs (12 Units total) into the skin 3 (three) times daily with meals. 15 mL 4  . Insulin Pen Needle 33G X 5 MM MISC 1 application by Does not apply route 4 (four) times daily as needed. 100 each 3  . lisinopril (PRINIVIL,ZESTRIL) 5 MG tablet Take 1 tablet (  5 mg total) by mouth daily. qam 90 tablet 2  . metoprolol succinate (TOPROL-XL) 100 MG 24 hr tablet Take 1 tablet (100 mg total) by mouth daily. Take with or immediately following a meal. qam 90 tablet 2  . nitroGLYCERIN (NITROSTAT) 0.3 MG SL tablet Place 1 tablet (0.3 mg total) under the tongue every 5 (five) minutes as needed. 90  tablet 0  . sertraline (ZOLOFT) 50 MG tablet Take 1 tablet (50 mg total) by mouth daily. 90 tablet 0  . spironolactone (ALDACTONE) 25 MG tablet Take 0.5 tablets (12.5 mg total) by mouth daily. qam 45 tablet 2   No current facility-administered medications for this visit.      Allergies:   Metrizamide; Other; Sulfa antibiotics; and Contrast media [iodinated diagnostic agents]   Social History:  The patient  reports that he has never smoked. He has never used smokeless tobacco. He reports that he does not drink alcohol or use drugs.   Family History:   family history includes Alcohol abuse in his father; Arthritis in his maternal grandmother and mother; Diabetes in his father and maternal grandfather; Heart disease in his father, maternal grandfather, maternal uncle, paternal aunt, and paternal grandmother; Hyperlipidemia in his brother, father, and maternal grandfather; Hypertension in his brother, father, maternal grandmother, and mother; Kidney disease in his maternal grandfather, maternal grandmother, paternal grandfather, and paternal grandmother.    Review of Systems: Review of Systems  Constitutional: Negative.   Respiratory: Positive for shortness of breath.   Cardiovascular: Negative.   Gastrointestinal: Negative.   Musculoskeletal: Negative.   Neurological: Negative.   Psychiatric/Behavioral: Negative.   All other systems reviewed and are negative.    PHYSICAL EXAM: VS:  There were no vitals taken for this visit. , BMI There is no height or weight on file to calculate BMI. GEN: Well nourished, well developed, in no acute distress  HEENT: normal  Neck: no JVD, carotid bruits, or masses Cardiac: RRR; no murmurs, rubs, or gallops,no edema  Respiratory:  clear to auscultation bilaterally, normal work of breathing GI: soft, nontender, nondistended, + BS MS: no deformity or atrophy  Skin: warm and dry, no rash Neuro:  Strength and sensation are intact Psych: euthymic mood,  full affect    Recent Labs: 08/05/2017: TSH 2.31 09/06/2017: ALT 18; BUN 16; Creatinine, Ser 0.79; Hemoglobin 15.5; Platelets 238.0; Potassium 4.5; Sodium 132    Lipid Panel Lab Results  Component Value Date   CHOL 144 08/05/2017   HDL 31.20 (L) 08/05/2017   LDLCALC 83 08/05/2017   TRIG 147.0 08/05/2017      Wt Readings from Last 3 Encounters:  09/06/17 214 lb 6 oz (97.2 kg)  08/04/17 219 lb 6 oz (99.5 kg)  05/01/17 232 lb 1.6 oz (105.3 kg)       ASSESSMENT AND PLAN:  Coronary artery disease of native artery of native heart with stable angina pectoris (HCC) - Plan: EKG 12-Lead Currently with no symptoms of angina. No further workup at this time. Continue current medication regimen.  Mixed hyperlipidemia Cholesterol is at goal on the current lipid regimen. No changes to the medications were made.  Type 2 diabetes mellitus with other circulatory complication, unspecified whether long term insulin use (HCC) Discussed diet strategies with him, he has guidance from nutritional counselor  Bipolar disorder, in partial remission, most recent episode depressed (HCC) Reports he is stable  Chronic combined systolic and diastolic CHF (congestive heart failure) (HCC) - Plan: EKG 12-Lead Ejection fraction 40% March 2017 Appears  relatively euvolemic,Not on diuretics We did discuss medication such as entresto. He does not want to make a change at this time. Blood pressure is low, would likely be difficult to add entresto No medication changes made  Disposition:   F/U  6 months   Total encounter time more than 45 minutes  Greater than 50% was spent in counseling and coordination of care with the patient    No orders of the defined types were placed in this encounter.    Signed, Dossie Arbour, M.D., Ph.D. 09/22/2017  [facility] [place], [place] [phone]

## 2017-09-23 ENCOUNTER — Ambulatory Visit: Payer: Self-pay | Admitting: Cardiovascular Disease

## 2017-10-05 ENCOUNTER — Other Ambulatory Visit: Payer: Self-pay

## 2017-10-05 ENCOUNTER — Other Ambulatory Visit: Payer: Self-pay | Admitting: Psychiatry

## 2017-10-05 ENCOUNTER — Encounter: Payer: Self-pay | Admitting: Psychiatry

## 2017-10-05 ENCOUNTER — Ambulatory Visit: Payer: 59 | Admitting: Psychiatry

## 2017-10-05 VITALS — BP 109/70 | HR 76 | Temp 97.6°F | Wt 208.2 lb

## 2017-10-05 DIAGNOSIS — F3162 Bipolar disorder, current episode mixed, moderate: Secondary | ICD-10-CM

## 2017-10-05 DIAGNOSIS — G4700 Insomnia, unspecified: Secondary | ICD-10-CM

## 2017-10-05 DIAGNOSIS — F419 Anxiety disorder, unspecified: Secondary | ICD-10-CM | POA: Diagnosis not present

## 2017-10-05 MED ORDER — LITHIUM CARBONATE 150 MG PO CAPS
150.0000 mg | ORAL_CAPSULE | Freq: Two times a day (BID) | ORAL | 0 refills | Status: DC
Start: 2017-10-05 — End: 2017-10-21

## 2017-10-05 NOTE — Progress Notes (Signed)
BH MD OP Progress Note  10/05/2017 4:08 PM Jose Merritt  MRN:  [id number]  Chief Complaint: ' I am here for follow up.' Chief Complaint    Follow-up; Medication Refill     HPI: Jose Merritt is a 42 year old Caucasian male, married, lives in [place], unemployed, has a history of bipolar disorder, sleep issues, multiple medical problems including diabetes mellitus, congestive heart failure, diabetic neuropathy and so on presented to the clinic today for a follow-up visit.  Patient today reports he stopped taking the Depakote since he was worried about his sodium level being abnormal.  Patient was advised to get Depakote level as well as a repeat BMP however he reports he stopped it even before he got his blood levels done.  He reports he went to the lab today to get his blood drawn, pending results.  Patient reports he continues to feel depressed.  He reports the only positive factor in his life is his daughter.  He reports he tries to function well when he is around her.  He reports he is able to cope with his mood symptoms when he is with his daughter.He takes care of his daughter daily.  He reports he continues to struggle with sleep problems.  He is currently not taking any sleep medications.  He also reports he has not filled his hydroxyzine prescription which was ordered last visit.  He reports he will pick it up from his pharmacy today.  He continues to struggle with chronic suicidal thoughts.  He however reports he does not have any active plan now  and will talk to his family or go to the nearest emergency department if he does .  He reports his daughter as a positive factor.  Reports his mother is in town today and she has been very supportive.  He continues to have a good relationship with his wife.  He denies any perceptual disturbances.  He denies any history of trauma. Visit Diagnosis:    ICD-10-CM   1. Bipolar 1 disorder, mixed, moderate (HCC) F31.62   2. Insomnia,  unspecified type G47.00   3. Anxiety disorder, unspecified type F41.9     Past Psychiatric History: Have reviewed past psychiatric history from my progress note on 09/20/2017.  Past trials of Depakote, trazodone, Wellbutrin, Celexa.  Past Medical History:  Past Medical History:  Diagnosis Date  . Asthma   . Bipolar disorder (HCC)   . CHF (congestive heart failure) (HCC)   . Chicken pox   . Clotting disorder (HCC)    lung  . Depression   . Diabetes mellitus without complication (HCC) 2003   Type 2  . Heart murmur    as child  . Hyperlipidemia   . Hypertension   . Myocardial infarction (HCC) 06/2015  . Retinopathy of both eyes 01/2016  . SOB (shortness of breath) 08/17/2016  . Stroke [facility])     Past Surgical History:  Procedure Laterality Date  . CORONARY ANGIOPLASTY WITH STENT PLACEMENT Left 06/2015   LADx1 stent  . WISDOM TOOTH EXTRACTION      Family Psychiatric History: Reviewed family psychiatric history from my progress note on 09/20/2017  Family History:  Family History  Problem Relation Age of Onset  . Hypertension Mother   . Arthritis Mother   . Heart disease Father        3 heart attacks  . Diabetes Father   . Hypertension Father   . Hyperlipidemia Father   . Alcohol abuse Father   .  Hypertension Brother   . Hyperlipidemia Brother   . Heart disease Maternal Uncle   . Heart disease Paternal Aunt   . Heart disease Maternal Grandfather   . Hyperlipidemia Maternal Grandfather   . Diabetes Maternal Grandfather   . Kidney disease Maternal Grandfather   . Heart disease Paternal Grandmother   . Kidney disease Paternal Grandmother   . Hypertension Maternal Grandmother   . Kidney disease Maternal Grandmother   . Arthritis Maternal Grandmother   . Kidney disease Paternal Grandfather     Social History: Pt is married.  He is unemployed.  He has a 82-year-old daughter whose name is Jose Merritt.  He reports his daughter as a major positive factor in his life.  His wife is  supportive.  Reviewed social history from my progress note on 09/20/2017. Social History   Socioeconomic History  . Marital status: Married    Spouse name: [name]  . Number of children: 1  . Years of education: Not on file  . Highest education level: Associate degree: occupational, Scientist, product/process development, or vocational program  Occupational History  . 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 Financial resource strain: Not hard at all  . Food insecurity:    Worry: Never true    Inability: Never true  . Transportation needs:    Medical: No    Non-medical: No  Tobacco Use  . Smoking status: Never Smoker  . Smokeless tobacco: Never Used  Substance and Sexual Activity  . Alcohol use: No    Comment: rare- 2x/year  . Drug use: No  . Sexual activity: Yes    Comment: impotence post heart attack  Lifestyle  . Physical activity:    Days per week: 0 days    Minutes per session: 0 min  . Stress: Not at all  Relationships  . Social connections:    Talks on phone: More than three times a week    Gets together: Once a week    Attends religious service: More than 4 times per year    Active member of club or organization: No    Attends meetings of clubs or organizations: Never    Relationship status: Married  Other Topics Concern  . 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 Metrizamide Anaphylaxis  . Other Anaphylaxis    Shrimp  . Sulfa Antibiotics Anaphylaxis    Uncoded Allergy. Allergen: CT contrast dye. Tolerated contrast for heart catheterization 07/2015  . Contrast Media [Iodinated Diagnostic Agents]     Metabolic Disorder Labs: Lab Results  Component Value Date   HGBA1C 13.4 (H) 08/05/2017   No results found for: PROLACTIN Lab Results  Component Value Date   CHOL 144 08/05/2017    TRIG 147.0 08/05/2017   HDL 31.20 (L) 08/05/2017   CHOLHDL 5 08/05/2017   VLDL 29.4 08/05/2017   LDLCALC 83 08/05/2017   LDLCALC 70 05/01/2017   Lab Results  Component Value Date   TSH 2.31 08/05/2017    Therapeutic Level Labs: No results found for: LITHIUM No results found for: VALPROATE No components found for:  CBMZ  Current Medications: Current Outpatient Medications  Medication Sig Dispense Refill  . aspirin 81 MG chewable  tablet Chew by mouth daily.    Marland Kitchen atorvastatin (LIPITOR) 80 MG tablet Take 1 tablet (80 mg total) by mouth daily. qam 90 tablet 2  . clopidogrel (PLAVIX) 75 MG tablet Take 1 tablet (75 mg total) by mouth daily. qam 90 tablet 2  . hydrOXYzine (VISTARIL) 25 MG capsule Take 1-2 capsules (25-50 mg total) by mouth 2 (two) times daily as needed (FOR ANXIETY, SLEEP). 120 capsule 1  . INS SYRINGE/NEEDLE 1CC/28G (B-D INSULIN SYRINGE 1CC/28G) 28G X 1/2" 1 ML MISC Use daily for insulin administration 100 each 11  . insulin glargine (LANTUS) 100 UNIT/ML injection Inject 0.25 mLs (25 Units total) into the skin daily. Increase by 1 units/day until fasting BS are < 150 10 mL 11  . insulin lispro (HUMALOG) 100 UNIT/ML KiwkPen Inject 0.12 mLs (12 Units total) into the skin 3 (three) times daily with meals. 15 mL 4  . Insulin Pen Needle 33G X 5 MM MISC 1 application by Does not apply route 4 (four) times daily as needed. 100 each 3  . lisinopril (PRINIVIL,ZESTRIL) 5 MG tablet Take 1 tablet (5 mg total) by mouth daily. qam 90 tablet 2  . metoprolol succinate (TOPROL-XL) 100 MG 24 hr tablet Take 1 tablet (100 mg total) by mouth daily. Take with or immediately following a meal. qam 90 tablet 2  . nitroGLYCERIN (NITROSTAT) 0.3 MG SL tablet Place 1 tablet (0.3 mg total) under the tongue every 5 (five) minutes as needed. 90 tablet 0  . sertraline (ZOLOFT) 50 MG tablet Take 1 tablet (50 mg total) by mouth daily. 90 tablet 0  . spironolactone (ALDACTONE) 25 MG tablet Take 0.5 tablets  (12.5 mg total) by mouth daily. qam 45 tablet 2  . lithium carbonate 150 MG capsule Take 1 capsule (150 mg total) by mouth 2 (two) times daily with a meal. Mood sx 60 capsule 0   No current facility-administered medications for this visit.      Musculoskeletal: Strength & Muscle Tone: within normal limits Gait & Station: normal Patient leans: N/A  Psychiatric Specialty Exam: Review of Systems  Psychiatric/Behavioral: Positive for depression. The patient is nervous/anxious and has insomnia.   All other systems reviewed and are negative.   Blood pressure 109/70, pulse 76, temperature 97.6 F (36.4 C), temperature source Oral, weight 208 lb 3.2 oz (94.4 kg).Body mass index is 28.24 kg/m.  General Appearance: Casual  Eye Contact:  Fair  Speech:  Normal Rate  Volume:  Normal  Mood:  Dysphoric  Affect:  Congruent  Thought Process:  Goal Directed and Descriptions of Associations: Intact  Orientation:  Full (Time, Place, and Person)  Thought Content: Logical   Suicidal Thoughts:  has chronic SI , but denies any active plan, appears to be future oriented and has good social support  Homicidal Thoughts:  No  Memory:  Immediate;   Fair Recent;   Fair Remote;   Fair  Judgement:  Fair  Insight:  Fair  Psychomotor Activity:  Normal  Concentration:  Concentration: Fair and Attention Span: Fair  Recall:  [employer] of Knowledge: Fair  Language: Fair  Akathisia:  No  Handed:  Right  AIMS (if indicated): NA  Assets:  Communication Skills Desire for Improvement Social Support  ADL's:  Intact  Cognition: WNL  Sleep:  restless   Screenings: PHQ2-9     Office Visit from 09/06/2017 in [facility] Office Visit from 04/01/2017 in [facility] [place] Patient Outreach from 02/03/2017 in [facility] Patient Outreach from 01/11/2017 in [facility] Patient Outreach from 11/11/2016 in [facility]  PHQ-2 Total Score  2  0  0  6  0   PHQ-9 Total Score  7  -  2  12  5        Assessment and Plan: Mitsugi is a 42 year old Caucasian male who has a history of bipolar disorder, insomnia, multiple medical problems including congestive heart failure, diabetes mellitus, neuropathy, presented to the clinic today for a follow-up visit.  Patient continues to be depressed.  Patient also struggles with chronic suicidality.  He however appears to be future oriented is motivated to stay in treatment.  Patient will start psychotherapy with Ms. Felecia Jan soon.  He also has good social support system and he reports his daughter as a positive factor in his life.  Continue plan as noted below.  Plan For bipolar disorder Discontinue Depakote for noncompliance Continue Zoloft 50 mg p.o. daily.  He was started on Zoloft a year ago by Dr. Edwin Dada. Discontinue Cymbalta for lack of efficacy Start lithium 150 mg p.o. twice daily with meals.  Discussed with him the side effects of being on lithium.  Discussed with him to stay hydrated.  Provided medication education. Provided him with lab slip to get lithium level drawn.  He will get it in 5 days  of starting the medication. I have reviewed TSH which was done recently by his PMD-within normal limits.  For insomnia Continue hydroxyzine 25-50 mg p.o. nightly as needed.  He reports he never filled the prescription.  He reports he will pick it up today.  For anxiety symptoms Zoloft 50 mg p.o. daily  He will start psychotherapy with Ms. Felecia Jan soon.  Follow-up in clinic in 10 days or sooner if needed.  Crisis plan discussed with patient.  More than 50 % of the time was spent for psychoeducation and supportive psychotherapy and care coordination.  This note was generated in part or whole with voice recognition software. Voice recognition is usually quite accurate but there are transcription errors that can and very often do occur. I apologize for any typographical errors that were not  detected and corrected.       Jomarie Longs, MD 10/05/2017, 4:08 PM

## 2017-10-05 NOTE — Patient Instructions (Signed)
Lithium tablets or capsules What is this medicine? LITHIUM (LITH ee um) is used to prevent and treat the manic episodes caused by manic-depressive illness. This medicine may be used for other purposes; ask your health care provider or pharmacist if you have questions. COMMON BRAND NAME(S): Eskalith What should I tell my health care provider before I take this medicine? They need to know if you have any of these conditions: -active infection -Brugada Syndrome -dehydration (diarrhea or sweating) -diet low in salt -heart disease -high levels of calcium in the blood -history of irregular heartbeat -kidney disease -low level of potassium or sodium in the blood -parathyroid disease -problems urinating -thyroid disease -an unusual or allergic reaction to lithium, other medicines, foods, dyes, or preservatives -pregnant or trying to get pregnant -breast-feeding How should I use this medicine? Take this medicine by mouth with a glass of water. Follow the directions on the prescription label. Take after a meal or snack to avoid stomach upset. Take your doses at regular intervals. Do not take your medicine more often than directed. The amount of this medicine you take is very important. Taking more than the prescribed dose can cause serious side effects. Do not stop taking except on the advice of your doctor or health care professional. Talk to your pediatrician regarding the use of this medicine in children. Special care may be needed. While this drug may be prescribed for children as young as 12 years for selected conditions, precautions do apply. Overdosage: If you think you have taken too much of this medicine contact a poison control center or emergency room at once. NOTE: This medicine is only for you. Do not share this medicine with others. What if I miss a dose? If you miss a dose, take it as soon as you can. If it is almost time for your next dose, take only that dose. Do not take double or  extra doses. What may interact with this medicine? Do not take this medicine with any of the following medications: -cisapride -dofetilide -dronedarone -pimozide -stimulant medicines used to treat ADHD or narcolepsy -thioridazine -ziprasidone This medicine may also interact with the following medications: -buspirone -caffeine -calcium iodide -carbamazepine -certain medicines for depression, anxiety, or psychotic disturbances -certain medicines for migraine headache like almotriptan, eletriptan, frovatriptan, naratriptan, rizatriptan, sumatriptan, zolmitriptan -diuretics -fentanyl -linezolid -MAOIs like Carbex, Eldepryl, Marplan, Nardil, and Parnate -medicines for high blood pressure -medicines that relax muscles for surgery -metronidazole -NSAIDs, medicines for pain and inflammation, like ibuprofen or naproxen -other medicines that prolong the QT interval (cause an abnormal heart rhythm) -phenytoin -potassium iodide, KI -sodium bicarbonate -sodium chloride -St. John's Wort -tramadol -tryptophan -urea This list may not describe all possible interactions. Give your health care provider a list of all the medicines, herbs, non-prescription drugs, or dietary supplements you use. Also tell them if you smoke, drink alcohol, or use illegal drugs. Some items may interact with your medicine. What should I watch for while using this medicine? Visit your doctor or health care professional for regular checks on your progress. It can take several weeks of treatment before you start to get better. The amount of salt (sodium) in your body influences the effects of this medicine, and this medicine can increase salt loss from the body. Eat a normal diet that includes salt. Do not change to salt substitutes. Avoid changes involving diet, or medications that include large amounts of sodium like sodium bicarbonate. Ask your doctor or health care professional for advice if you are not   sure. Drink  plenty of fluids while you are taking this medicine. Avoid drinks that contain caffeine, such as coffee, tea and colas. You will need extra fluids if you have diarrhea or sweat a lot. This will help prevent toxic effects from this medicine. Be careful not to get overheated during exercise, saunas, hot baths, and hot weather. Consult your doctor or health care professional if you have a high fever or persistent diarrhea. You may get drowsy or dizzy. Do not drive, use machinery, or do anything that needs mental alertness until you know how this medicine affects you. Do not stand or sit up quickly, especially if you are an older patient. This reduces the risk of dizzy or fainting spells. What side effects may I notice from receiving this medicine? Side effects that you should report to your doctor or health care professional as soon as possible: -allergic reactions like skin rash, itching or hives, swelling of the face, lips, or tongue -blurred vision -breathing problems -clumsiness or loss of balance -confusion -difficulty speaking or swallowing -dizziness -feeling faint or lightheaded, falls -increased thirst -increased urination -loss of appetite -muscle weakness -nausea, vomiting -pain, coldness, or blue coloration of fingers or toes -sensitivity to cold -seizures -slow, fast, or irregular heartbeat (palpitations) -slurred speech -swelling in the neck -unusually weak or tired Side effects that usually do not require medical attention (report to your doctor or health care professional if they continue or are bothersome): -acne -diarrhea -mild tremor -stomach pain -weight gain This list may not describe all possible side effects. Call your doctor for medical advice about side effects. You may report side effects to FDA at 1-800-FDA-1088. Where should I keep my medicine? Keep out of the reach of children. Store at room temperature between 15 and 30 degrees C (59 and 86 degrees F).  Throw away any unused medicine after the expiration date. NOTE: This sheet is a summary. It may not cover all possible information. If you have questions about this medicine, talk to your doctor, pharmacist, or health care provider.  2018 Elsevier/Gold Standard (2015-05-17 17:31:38)  

## 2017-10-06 ENCOUNTER — Ambulatory Visit
Admission: RE | Admit: 2017-10-06 | Discharge: 2017-10-06 | Disposition: A | Discharge status: Home / Self Care | Payer: 59 | Source: Ambulatory Visit | Attending: Family | Admitting: Family

## 2017-10-06 DIAGNOSIS — Z8673 Personal history of transient ischemic attack (TIA), and cerebral infarction without residual deficits: Secondary | ICD-10-CM | POA: Diagnosis not present

## 2017-10-06 DIAGNOSIS — G459 Transient cerebral ischemic attack, unspecified: Secondary | ICD-10-CM | POA: Diagnosis not present

## 2017-10-06 LAB — VITAMIN D 25 HYDROXY (VIT D DEFICIENCY, FRACTURES): Vit D, 25-Hydroxy: 16.5 ng/mL — ABNORMAL LOW (ref 30.0–100.0)

## 2017-10-06 LAB — BASIC METABOLIC PANEL
BUN/Creatinine Ratio: 14 (ref 9–20)
BUN: 9 mg/dL (ref 6–24)
CHLORIDE: 99 mmol/L (ref 96–106)
CO2: 23 mmol/L (ref 20–29)
Calcium: 9.3 mg/dL (ref 8.7–10.2)
Creatinine, Ser: 0.64 mg/dL — ABNORMAL LOW (ref 0.76–1.27)
GFR calc Af Amer: 138 mL/min/{1.73_m2} (ref >59)
GFR calc non Af Amer: 119 mL/min/{1.73_m2} (ref >59)
GLUCOSE: 394 mg/dL — AB (ref 65–99)
POTASSIUM: 4.4 mmol/L (ref 3.5–5.2)
SODIUM: 135 mmol/L (ref 134–144)

## 2017-10-06 LAB — B12 AND FOLATE PANEL
FOLATE: 17 ng/mL (ref >3.0)
VITAMIN B 12: 331 pg/mL (ref 232–1245)

## 2017-10-06 LAB — VALPROIC ACID LEVEL: Valproic Acid Lvl: <4 ug/mL — ABNORMAL LOW (ref 50–100)

## 2017-10-06 NOTE — Progress Notes (Signed)
Cardiology Office Note  Date:  10/07/2017   ID:  Jose Merritt, Jose Merritt [birth year], MRN [id number]  PCP:  Allegra Grana, FNP   Chief Complaint  Patient presents with  . Other    6 month follow up. [name] c/o SOB but states he has had that. Meds reviewed verbally with patient.     HPI:  Jose Merritt is a pleasant 42 year old gentleman with history of CAD, Stent to LAD 06/2015, initially presented with shoulder pain neck pain, shortness of breath/anginal equivalent DM II, HBA1C 9.3 Bipolar/depression HTN Hyperlipidemia Obesity TIA x2 in 2018 Who presents for management of his coronary disease  In follow-up today he reports having Tia x2 Details discussed with him Hospital records reviewed,  November 2018 reports that he was Getting an ice cream, Started complaining of arm pain, people reported he was talking off topic, Left arm, left shoulder numb He went to the hospital, [facility] CT head ok MRI ok Recent carotid ultrasound with no significant disease  Earlier in 2018 also reported having similar symptoms Went to hospital in [place], results not available   left arm numb again No speech problem that time  Details not clear but reports that he was given a medication at the time that he had significant allergy to  No further episodes since that time Denies any shortness of breath or chest pain Active, 68-year-old girl  EKG personally reviewed by myself on todays visit Shows normal sinus rhythm rate 87 bpm old anterior MI  Review of records shows Admitted 1/31-07/11/2015 for STEMI. 1 week prior to presentation he had left shoulder pain   treated at urgent care for bursitis with a steroid injection.   In the few days prior to presentation,   EKG with ST elevation. Cath with occluded mid LAD treated with DES. No residual disease.   Echo with ejection fraction 20% with probable LV thrombus. Started on warfarin. Metoprolol succinate, atorvastatin and lisinopril  also initiated.   08/29/15 Repeat Cardiac cath, stable , stents patent Echocardiogram with improved ejection fraction (40%) and resolution of LV apical thrombus. Lifevest stopped Warfarin stopped, but he continued to take   followed by endocrinologist, Dr Vincente Liberty with DM nutritionist  Bipolar-seen psychiatrist;  Previous echocardiogram March 2017, ejection fraction 40%, up from 19%   PMH:   has a past medical history of Asthma, Bipolar disorder (HCC), CHF (congestive heart failure) (HCC), Chicken pox, Clotting disorder (HCC), Depression, Diabetes mellitus without complication (HCC) (2003), Heart murmur, Hyperlipidemia, Hypertension, Myocardial infarction (HCC) (06/2015), Retinopathy of both eyes (01/2016), SOB (shortness of breath) (08/17/2016), and Stroke (HCC).  PSH:    Past Surgical History:  Procedure Laterality Date  . CORONARY ANGIOPLASTY WITH STENT PLACEMENT Left 06/2015   LADx1 stent  . WISDOM TOOTH EXTRACTION      Current Outpatient Medications  Medication Sig Dispense Refill  . aspirin 81 MG chewable tablet Chew by mouth daily.    Marland Kitchen atorvastatin (LIPITOR) 80 MG tablet Take 1 tablet (80 mg total) by mouth daily. qam 90 tablet 2  . clopidogrel (PLAVIX) 75 MG tablet Take 1 tablet (75 mg total) by mouth daily. qam 90 tablet 2  . hydrOXYzine (VISTARIL) 25 MG capsule Take 1-2 capsules (25-50 mg total) by mouth 2 (two) times daily as needed (FOR ANXIETY, SLEEP). 120 capsule 1  . INS SYRINGE/NEEDLE 1CC/28G (B-D INSULIN SYRINGE 1CC/28G) 28G X 1/2" 1 ML MISC Use daily for insulin administration 100 each 11  . insulin glargine (LANTUS) 100 UNIT/ML injection Inject 0.25  mLs (25 Units total) into the skin daily. Increase by 1 units/day until fasting BS are < 150 10 mL 11  . insulin lispro (HUMALOG) 100 UNIT/ML KiwkPen Inject 0.12 mLs (12 Units total) into the skin 3 (three) times daily with meals. 15 mL 4  . Insulin Pen Needle 33G X 5 MM MISC 1 application by Does not apply  route 4 (four) times daily as needed. 100 each 3  . lisinopril (PRINIVIL,ZESTRIL) 5 MG tablet Take 1 tablet (5 mg total) by mouth daily. qam 90 tablet 2  . lithium carbonate 150 MG capsule Take 1 capsule (150 mg total) by mouth 2 (two) times daily with a meal. Mood sx 60 capsule 0  . metoprolol succinate (TOPROL-XL) 100 MG 24 hr tablet Take 1 tablet (100 mg total) by mouth daily. Take with or immediately following a meal. qam 90 tablet 2  . nitroGLYCERIN (NITROSTAT) 0.3 MG SL tablet Place 1 tablet (0.3 mg total) under the tongue every 5 (five) minutes as needed. 90 tablet 0  . sertraline (ZOLOFT) 50 MG tablet Take 1 tablet (50 mg total) by mouth daily. 90 tablet 0  . spironolactone (ALDACTONE) 25 MG tablet Take 0.5 tablets (12.5 mg total) by mouth daily. qam 45 tablet 2   No current facility-administered medications for this visit.      Allergies:   Metrizamide; Other; Sulfa antibiotics; and Contrast media [iodinated diagnostic agents]   Social History:  The patient  reports that he has never smoked. He has never used smokeless tobacco. He reports that he does not drink alcohol or use drugs.   Family History:   family history includes Alcohol abuse in his father; Arthritis in his maternal grandmother and mother; Diabetes in his father and maternal grandfather; Heart disease in his father, maternal grandfather, maternal uncle, paternal aunt, and paternal grandmother; Hyperlipidemia in his brother, father, and maternal grandfather; Hypertension in his brother, father, maternal grandmother, and mother; Kidney disease in his maternal grandfather, maternal grandmother, paternal grandfather, and paternal grandmother.    Review of Systems: Review of Systems  Constitutional: Negative.   Cardiovascular: Negative.   Gastrointestinal: Negative.   Musculoskeletal: Negative.   Neurological: Negative.        Prior episodes of left arm numbness  Psychiatric/Behavioral: Negative.   All other systems  reviewed and are negative.    PHYSICAL EXAM: VS:  BP 108/66 (BP Location: Left Arm, Patient Position: Sitting, Cuff Size: Normal)   Pulse 87   Ht 6' (1.829 m)   Wt 209 lb 12 oz (95.1 kg)   BMI 28.45 kg/m  , BMI Body mass index is 28.45 kg/m. Constitutional:  oriented to person, place, and time. No distress.  HENT:  Head: Normocephalic and atraumatic.  Eyes:  no discharge. No scleral icterus.  Neck: Normal range of motion. Neck supple. No JVD present.  Cardiovascular: Normal rate, regular rhythm, normal heart sounds and intact distal pulses. Exam reveals no gallop and no friction rub. No edema No murmur heard. Pulmonary/Chest: Effort normal and breath sounds normal. No stridor. No respiratory distress.  no wheezes.  no rales.  no tenderness.  Abdominal: Soft.  no distension.  no tenderness.  Musculoskeletal: Normal range of motion.  no  tenderness or deformity.  Neurological:  normal muscle tone. Coordination normal. No atrophy Skin: Skin is warm and dry. No rash noted. not diaphoretic.  Psychiatric:  normal mood and affect. behavior is normal. Thought content normal.    Recent Labs: 08/05/2017: TSH 2.31 09/06/2017: ALT  18; BUN 16; Creatinine, Ser 0.79; Hemoglobin 15.5; Platelets 238.0; Potassium 4.5; Sodium 132    Lipid Panel Lab Results  Component Value Date   CHOL 144 08/05/2017   HDL 31.20 (L) 08/05/2017   LDLCALC 83 08/05/2017   TRIG 147.0 08/05/2017      Wt Readings from Last 3 Encounters:  10/07/17 209 lb 12 oz (95.1 kg)  09/06/17 214 lb 6 oz (97.2 kg)  08/04/17 219 lb 6 oz (99.5 kg)      ASSESSMENT AND PLAN:  Coronary artery disease of native artery of native heart with stable angina pectoris (HCC) - Plan: EKG 12-Lead Long discussion concerning various treatment options given recent TIA symptoms recommended he stop aspirin and stay on Plavix And start xarelto 2.5 mg po BID  Mixed hyperlipidemia Cholesterol above goal likely from weight gain 10 pounds Long  discussion with him concerning lipid panel and goals We will add Zetia to his Crestor Repeat lipid panel in 3 months time with primary care  TIA He was evaluated in the hospital twice for TIA symptoms Discussed with him new trials Will discontinue aspirin as above continue Plavix and start Xarelto 2.5 twice daily  Type 2 diabetes mellitus with other circulatory complication, unspecified whether long term insulin use (HCC) We have encouraged continued exercise, careful diet management in an effort to lose weight. Weight is up 10 pounds  Bipolar disorder, in partial remission, most recent episode depressed (HCC) stable  Chronic combined systolic and diastolic CHF (congestive heart failure) (HCC) - Plan: EKG 12-Lead Ejection fraction 40% March 2017  euvolemic,Not on diuretics we previously discussed We previously discussed Entresto.  He did not want to change his medications No changes at this time  Disposition:   F/U  12 months   Total encounter time more than 45 minutes  Greater than 50% was spent in counseling and coordination of care with the patient    Orders Placed This Encounter  Procedures  . EKG 12-Lead     Signed, Dossie Arbour, M.D., Ph.D. 10/07/2017  [facility] [place], [place] [phone]

## 2017-10-07 ENCOUNTER — Encounter: Payer: Self-pay | Admitting: Cardiovascular Disease

## 2017-10-07 ENCOUNTER — Ambulatory Visit: Payer: 59 | Admitting: Cardiovascular Disease

## 2017-10-07 VITALS — BP 108/66 | HR 87 | Ht 72.0 in | Wt 209.8 lb

## 2017-10-07 DIAGNOSIS — I5042 Chronic combined systolic (congestive) and diastolic (congestive) heart failure: Secondary | ICD-10-CM

## 2017-10-07 DIAGNOSIS — E1159 Type 2 diabetes mellitus with other circulatory complications: Secondary | ICD-10-CM | POA: Diagnosis not present

## 2017-10-07 DIAGNOSIS — E782 Mixed hyperlipidemia: Secondary | ICD-10-CM

## 2017-10-07 DIAGNOSIS — G459 Transient cerebral ischemic attack, unspecified: Secondary | ICD-10-CM | POA: Insufficient documentation

## 2017-10-07 DIAGNOSIS — I25118 Atherosclerotic heart disease of native coronary artery with other forms of angina pectoris: Secondary | ICD-10-CM

## 2017-10-07 DIAGNOSIS — I749 Embolism and thrombosis of unspecified artery: Secondary | ICD-10-CM | POA: Diagnosis not present

## 2017-10-07 DIAGNOSIS — F3175 Bipolar disorder, in partial remission, most recent episode depressed: Secondary | ICD-10-CM | POA: Diagnosis not present

## 2017-10-07 NOTE — Patient Instructions (Addendum)
Medication Instructions:  Your physician has recommended you make the following change in your medication:  1. STOP Aspirin 2. STAY on Plavix 3. START Xarelto 2.5 mg twice a day  Medication Samples have been provided to the patient.  Drug name: Xarelto        Strength: 2.5 mg        Qty: 2 bottles  LOT: [id number]  Exp.Date: 9/21   Labwork:  No new labs needed  Testing/Procedures:  No further testing at this time   Follow-Up: It was a pleasure seeing you in the office today. Please call us if you have new issues that need to be addressed before your next appt.  [phone]  Your physician wants you to follow-up in: 12 months.  You will receive a reminder letter in the mail two months in advance. If you don't receive a letter, please call our office to schedule the follow-up appointment.  If you need a refill on your cardiac medications before your next appointment, please call your pharmacy.  For educational health videos Log in to : [email or web address] Or : [email or web address], password : [email or web address]

## 2017-10-11 ENCOUNTER — Encounter: Payer: Self-pay | Admitting: Family

## 2017-10-11 ENCOUNTER — Ambulatory Visit: Payer: 59 | Admitting: Family

## 2017-10-11 VITALS — BP 110/70 | HR 82 | Temp 98.1°F | Resp 16 | Wt 206.0 lb

## 2017-10-11 DIAGNOSIS — Z23 Encounter for immunization: Secondary | ICD-10-CM

## 2017-10-11 DIAGNOSIS — Z8673 Personal history of transient ischemic attack (TIA), and cerebral infarction without residual deficits: Secondary | ICD-10-CM | POA: Diagnosis not present

## 2017-10-11 DIAGNOSIS — F3175 Bipolar disorder, in partial remission, most recent episode depressed: Secondary | ICD-10-CM | POA: Diagnosis not present

## 2017-10-11 DIAGNOSIS — I10 Essential (primary) hypertension: Secondary | ICD-10-CM

## 2017-10-11 DIAGNOSIS — E1159 Type 2 diabetes mellitus with other circulatory complications: Secondary | ICD-10-CM

## 2017-10-11 MED ORDER — RIVAROXABAN 2.5 MG PO TABS: 2.5000 mg | ORAL_TABLET | Freq: Two times a day (BID) | ORAL | 2 refills | Status: DC

## 2017-10-11 MED ORDER — EZETIMIBE 10 MG PO TABS: 10.0000 mg | ORAL_TABLET | Freq: Every day | ORAL | 3 refills | Status: DC

## 2017-10-11 NOTE — Assessment & Plan Note (Signed)
Reviewed consult cardiology, Dr. Mariah Milling.  Patient advised to go ahead and start Xarelto, to continue Plavix.  Was also advised to discontinue aspirin.  Patient did not receive a prescription for Xarelto I have given him that today.  Also sent a note cardiology let him know that I have done so.  Will follow

## 2017-10-11 NOTE — Assessment & Plan Note (Addendum)
Uncontrolled. Not taking his insulin.  Long discussion regarding its manifestations of vision changes, polyuria.  Discussed long-term sequela of uncontrolled diabetes.  Concern for electrolyte abnormalities.  Patient declines blood work today and states he will come back next week once he has started his insulin again. Pending eye exam. Foot exam performed today.  Will follow.

## 2017-10-11 NOTE — Assessment & Plan Note (Signed)
At goal today. Continue current regimen.  

## 2017-10-11 NOTE — Progress Notes (Signed)
Ok.Thanks for letting me know. He did not call me. Will see him when he gets back.

## 2017-10-11 NOTE — Addendum Note (Signed)
Addended by: Alisia Ferrari on: 10/11/2017 02:19 PM   Modules accepted: Orders

## 2017-10-11 NOTE — Progress Notes (Signed)
Subjective:    Patient ID: Deivi Huckins, male    DOB: [birth year], 42 y.o.   MRN: [id number]  CC: Zyrell Carmean is a 42 y.o. male who presents today for follow up.   HPI: Overall feeling well today "having a good day today".  He spent Mother's Day weekend making a bench for his wife which he enjoyed DM- not taking insulin. Not sure why.  Endorses polyuria. Describes hard to see things up close since his sugars have been elevated  Pending eye appointment.  Weight down to 206.   HTN- compliant with medication. Denies exertional chest pain or pressure, numbness or tingling radiating to left arm or jaw, palpitations, dizziness, frequent headaches, changes in vision, or shortness of breath.   Bipolar- not taking lithium. Concerns with AE. No SI/HI today. 'today is good day.' Leaving to pick up daughter from school and go to lunch with friend's son.   Dr Elna Breslow 10/05/2017; dc depakote, cymbalta Continue zoloft, start lithium , hydrozyzine Psychotherapy starts this week.  Crisis plan in place K 4.4 last week   Gollan last week; started on zetia, and xarelto ( no rx given; notes he has samples of xarelto) HISTORY:  Past Medical History:  Diagnosis Date  . Asthma   . Bipolar disorder (HCC)   . CHF (congestive heart failure) (HCC)   . Chicken pox   . Clotting disorder (HCC)    lung  . Depression   . Diabetes mellitus without complication (HCC) 2003   Type 2  . Heart murmur    as child  . Hyperlipidemia   . Hypertension   . Myocardial infarction (HCC) 06/2015  . Retinopathy of both eyes 01/2016  . SOB (shortness of breath) 08/17/2016  . Stroke [facility])    Past Surgical History:  Procedure Laterality Date  . CORONARY ANGIOPLASTY WITH STENT PLACEMENT Left 06/2015   LADx1 stent  . WISDOM TOOTH EXTRACTION     Family History  Problem Relation Age of Onset  . Hypertension Mother   . Arthritis Mother   . Heart disease Father        3 heart attacks  . Diabetes  Father   . Hypertension Father   . Hyperlipidemia Father   . Alcohol abuse Father   . Hypertension Brother   . Hyperlipidemia Brother   . Heart disease Maternal Uncle   . Heart disease Paternal Aunt   . Heart disease Maternal Grandfather   . Hyperlipidemia Maternal Grandfather   . Diabetes Maternal Grandfather   . Kidney disease Maternal Grandfather   . Heart disease Paternal Grandmother   . Kidney disease Paternal Grandmother   . Hypertension Maternal Grandmother   . Kidney disease Maternal Grandmother   . Arthritis Maternal Grandmother   . Kidney disease Paternal Grandfather     Allergies: Metrizamide; Other; Sulfa antibiotics; and Contrast media [iodinated diagnostic agents] Current Outpatient Medications on File Prior to Visit  Medication Sig Dispense Refill  . atorvastatin (LIPITOR) 80 MG tablet Take 1 tablet (80 mg total) by mouth daily. qam 90 tablet 2  . clopidogrel (PLAVIX) 75 MG tablet Take 1 tablet (75 mg total) by mouth daily. qam 90 tablet 2  . INS SYRINGE/NEEDLE 1CC/28G (B-D INSULIN SYRINGE 1CC/28G) 28G X 1/2" 1 ML MISC Use daily for insulin administration 100 each 11  . Insulin Pen Needle 33G X 5 MM MISC 1 application by Does not apply route 4 (four) times daily as needed. 100 each 3  . lisinopril (PRINIVIL,ZESTRIL)  5 MG tablet Take 1 tablet (5 mg total) by mouth daily. qam 90 tablet 2  . lithium carbonate 150 MG capsule Take 1 capsule (150 mg total) by mouth 2 (two) times daily with a meal. Mood sx 60 capsule 0  . metoprolol succinate (TOPROL-XL) 100 MG 24 hr tablet Take 1 tablet (100 mg total) by mouth daily. Take with or immediately following a meal. qam 90 tablet 2  . nitroGLYCERIN (NITROSTAT) 0.3 MG SL tablet Place 1 tablet (0.3 mg total) under the tongue every 5 (five) minutes as needed. 90 tablet 0  . sertraline (ZOLOFT) 50 MG tablet Take 1 tablet (50 mg total) by mouth daily. 90 tablet 0  . spironolactone (ALDACTONE) 25 MG tablet Take 0.5 tablets (12.5 mg total)  by mouth daily. qam 45 tablet 2  . hydrOXYzine (VISTARIL) 25 MG capsule Take 1-2 capsules (25-50 mg total) by mouth 2 (two) times daily as needed (FOR ANXIETY, SLEEP). (Patient not taking: Reported on 10/11/2017) 120 capsule 1  . insulin glargine (LANTUS) 100 UNIT/ML injection Inject 0.25 mLs (25 Units total) into the skin daily. Increase by 1 units/day until fasting BS are < 150 (Patient not taking: Reported on 10/11/2017) 10 mL 11  . insulin lispro (HUMALOG) 100 UNIT/ML KiwkPen Inject 0.12 mLs (12 Units total) into the skin 3 (three) times daily with meals. (Patient not taking: Reported on 10/11/2017) 15 mL 4   No current facility-administered medications on file prior to visit.     Social History   Tobacco Use  . Smoking status: Never Smoker  . Smokeless tobacco: Never Used  Substance Use Topics  . Alcohol use: No    Comment: rare- 2x/year  . Drug use: No    Review of Systems  Constitutional: Negative for chills and fever.  Respiratory: Negative for cough.   Cardiovascular: Negative for chest pain and palpitations.  Gastrointestinal: Negative for nausea and vomiting.  Psychiatric/Behavioral: Positive for sleep disturbance. Negative for suicidal ideas.      Objective:    BP 110/70   Pulse 82   Temp 98.1 F (36.7 C) (Oral)   Resp 16   Wt 206 lb (93.4 kg)   SpO2 98%   BMI 27.94 kg/m  BP Readings from Last 3 Encounters:  10/11/17 110/70  10/07/17 108/66  10/05/17 109/70   Wt Readings from Last 3 Encounters:  10/11/17 206 lb (93.4 kg)  10/07/17 209 lb 12 oz (95.1 kg)  10/05/17 208 lb 3.2 oz (94.4 kg)    Physical Exam  Constitutional: He appears well-developed and well-nourished.  Cardiovascular: Regular rhythm and normal heart sounds.  Pulmonary/Chest: Effort normal and breath sounds normal. No respiratory distress. He has no wheezes. He has no rhonchi. He has no rales.  Lymphadenopathy:       Head (left side): No submandibular and no preauricular adenopathy present.    Neurological: He is alert.  Skin: Skin is warm and dry.  Psychiatric: He has a normal mood and affect. His speech is normal and behavior is normal.  Vitals reviewed.      Assessment & Plan:   Problem List Items Addressed This Visit      Cardiovascular and Mediastinum   HTN (hypertension)    At goal today.  Continue current regimen.      Relevant Medications   rivaroxaban (XARELTO) 2.5 MG TABS tablet   ezetimibe (ZETIA) 10 MG tablet   Type 2 diabetes mellitus with circulatory disorder (HCC)    Uncontrolled. Not taking his insulin.  Long discussion regarding its manifestations of vision changes, polyuria.  Discussed long-term sequela of uncontrolled diabetes.  Concern for electrolyte abnormalities.  Patient declines blood work today and states he will come back next week once he has started his insulin again. Pending eye exam. Foot exam performed today.  Will follow.      Relevant Medications   rivaroxaban (XARELTO) 2.5 MG TABS tablet   ezetimibe (ZETIA) 10 MG tablet     Other   Bipolar disorder, in partial remission, most recent episode depressed (HCC)    Overall pleased with patient's mood today.  He is following with psychiatry at this point.  He has not started the lithium due to some reservations that he has had.  He sees his psychiatrist later this week and also start psychotherapy this week. I sent her an FYI.       History of CVA (cerebrovascular accident)    Reviewed consult cardiology, Dr. Mariah Milling.  Patient advised to go ahead and start Xarelto, to continue Plavix.  Was also advised to discontinue aspirin.  Patient did not receive a prescription for Xarelto I have given him that today.  Also sent a note cardiology let him know that I have done so.  Will follow          I have discontinued Vania Rea. Gasparyan's aspirin. I am also having him start on rivaroxaban and ezetimibe. Additionally, I am having him maintain his nitroGLYCERIN, Insulin Pen Needle, atorvastatin,  clopidogrel, lisinopril, metoprolol succinate, spironolactone, sertraline, insulin lispro, insulin glargine, INS SYRINGE/NEEDLE 1CC/28G, hydrOXYzine, and lithium carbonate.   Meds ordered this encounter  Medications  . rivaroxaban (XARELTO) 2.5 MG TABS tablet    Sig: Take 1 tablet (2.5 mg total) by mouth 2 (two) times daily.    Dispense:  60 tablet    Refill:  2    Order Specific Question:   Supervising Provider    Answer:   Duncan Dull L [[id number]]  . ezetimibe (ZETIA) 10 MG tablet    Sig: Take 1 tablet (10 mg total) by mouth daily.    Dispense:  90 tablet    Refill:  3    Order Specific Question:   Supervising Provider    Answer:   Sherlene Shams [[id number]]    Return precautions given.   Risks, benefits, and alternatives of the medications and treatment plan prescribed today were discussed, and patient expressed understanding.   Education regarding symptom management and diagnosis given to patient on AVS.  Continue to follow with Allegra Grana, FNP for routine health maintenance.   Dixie Dials and I agreed with plan.   [name], FNP

## 2017-10-11 NOTE — Assessment & Plan Note (Signed)
Overall pleased with patient's mood today.  He is following with psychiatry at this point.  He has not started the lithium due to some reservations that he has had.  He sees his psychiatrist later this week and also start psychotherapy this week. I sent her an FYI.

## 2017-10-11 NOTE — Patient Instructions (Addendum)
Start insulin please please. This is so important to your health, mood. All of it. Worries about serious conditions where electrolytes are abnormal when sugars get this high- this can put you in the hospital, ICU.   Fasting labs next week  I sent in zetia, xarelto for you since Dr Mariah Milling didn't.  Stay on plavix. STOP aspirin  Hoping good things with Dr Elna Breslow this week

## 2017-10-12 NOTE — Progress Notes (Signed)
Left a message again for pt to call office back for results. Left message that a copy would be faxed to pcp and that a copy would be mailed him also for his records.

## 2017-10-13 ENCOUNTER — Encounter: Payer: Self-pay | Admitting: Family

## 2017-10-13 ENCOUNTER — Telehealth: Payer: Self-pay | Admitting: Family

## 2017-10-13 NOTE — Telephone Encounter (Signed)
close

## 2017-10-14 ENCOUNTER — Ambulatory Visit: Payer: 59 | Admitting: Psychiatry

## 2017-10-14 DIAGNOSIS — E113393 Type 2 diabetes mellitus with moderate nonproliferative diabetic retinopathy without macular edema, bilateral: Secondary | ICD-10-CM | POA: Diagnosis not present

## 2017-10-14 DIAGNOSIS — F3162 Bipolar disorder, current episode mixed, moderate: Secondary | ICD-10-CM | POA: Diagnosis not present

## 2017-10-15 LAB — HM DIABETES EYE EXAM

## 2017-10-20 ENCOUNTER — Telehealth: Payer: Self-pay

## 2017-10-20 ENCOUNTER — Other Ambulatory Visit: Payer: Self-pay

## 2017-10-20 ENCOUNTER — Ambulatory Visit: Payer: Self-pay | Admitting: Family

## 2017-10-20 ENCOUNTER — Ambulatory Visit: Payer: 59 | Admitting: Psychiatry

## 2017-10-20 NOTE — Telephone Encounter (Signed)
Pls collect from front desk

## 2017-10-20 NOTE — Telephone Encounter (Signed)
pt  left message that he lost his labslip and that he needed to get a new one.

## 2017-10-21 ENCOUNTER — Encounter: Payer: Self-pay | Admitting: Psychiatry

## 2017-10-21 ENCOUNTER — Ambulatory Visit: Payer: 59 | Admitting: Psychiatry

## 2017-10-21 ENCOUNTER — Other Ambulatory Visit: Payer: Self-pay

## 2017-10-21 ENCOUNTER — Other Ambulatory Visit: Payer: Self-pay | Admitting: Psychiatry

## 2017-10-21 VITALS — BP 124/77 | HR 94 | Wt 208.8 lb

## 2017-10-21 DIAGNOSIS — F419 Anxiety disorder, unspecified: Secondary | ICD-10-CM

## 2017-10-21 DIAGNOSIS — F3162 Bipolar disorder, current episode mixed, moderate: Secondary | ICD-10-CM

## 2017-10-21 DIAGNOSIS — G4700 Insomnia, unspecified: Secondary | ICD-10-CM

## 2017-10-21 MED ORDER — SERTRALINE HCL 50 MG PO TABS: 50.0000 mg | ORAL_TABLET | Freq: Every day | ORAL | 0 refills | Status: DC

## 2017-10-21 MED ORDER — LITHIUM CARBONATE 150 MG PO CAPS: 150.0000 mg | ORAL_CAPSULE | Freq: Two times a day (BID) | ORAL | 1 refills | Status: DC

## 2017-10-21 NOTE — Progress Notes (Signed)
BH MD OP Progress Note  10/21/2017 1:33 PM Jose Merritt  MRN:  [id number]  Chief Complaint: ' I am here for follow up." Chief Complaint    Follow-up; Medication Refill     HPI: Jose Merritt is a 42 year old Caucasian male, married, lives in [place], unemployed, has a history of bipolar disorder, sleep issues, multiple medical problems including diabetes mellitus, congestive heart failure, diabetic neuropathy, presented to the clinic today for a follow-up visit.   Pt today reports he is tolerating the lithium well.  He had his lithium labs taken this morning.  Its still pending.  He reports this past week had been good.  He denies any significant depressive symptoms or anxiety symptoms.  He denies any suicidality.  He reports sleep sometimes as restless.  He however has not taken the hydroxyzine yet.  He has reached out to Jose Merritt.  He reports he looks forward to staying in psychotherapy with Jose Merritt.  He reports one of his friends came from [place].  He reports that has also helped with his mood symptoms.  He denies abusing any drugs or alcohol.     Visit Diagnosis:    ICD-10-CM   1. Bipolar 1 disorder, mixed, moderate (HCC) F31.62   2. Insomnia, unspecified type G47.00   3. Anxiety disorder, unspecified type F41.9 sertraline (ZOLOFT) 50 MG tablet    Past Psychiatric History: I reviewed past psychiatric history from my progress note on 09/20/2017.  Past trials of Depakote, trazodone, Wellbutrin, Celexa.  Past Medical History:  Past Medical History:  Diagnosis Date  . Asthma   . Bipolar disorder (HCC)   . CHF (congestive heart failure) (HCC)   . Chicken pox   . Clotting disorder (HCC)    lung  . Depression   . Diabetes mellitus without complication (HCC) 2003   Type 2  . Heart murmur    as child  . Hyperlipidemia   . Hypertension   . Myocardial infarction (HCC) 06/2015  . Retinopathy of both eyes 01/2016  . SOB (shortness of breath) 08/17/2016  .  Stroke [facility])     Past Surgical History:  Procedure Laterality Date  . CORONARY ANGIOPLASTY WITH STENT PLACEMENT Left 06/2015   LADx1 stent  . WISDOM TOOTH EXTRACTION      Family Psychiatric History: Reviewed family psychiatric history from my progress note on 09/20/2017.  Family History:  Family History  Problem Relation Age of Onset  . Hypertension Mother   . Arthritis Mother   . Heart disease Father        3 heart attacks  . Diabetes Father   . Hypertension Father   . Hyperlipidemia Father   . Alcohol abuse Father   . Hypertension Brother   . Hyperlipidemia Brother   . Heart disease Maternal Uncle   . Heart disease Paternal Aunt   . Heart disease Maternal Grandfather   . Hyperlipidemia Maternal Grandfather   . Diabetes Maternal Grandfather   . Kidney disease Maternal Grandfather   . Heart disease Paternal Grandmother   . Kidney disease Paternal Grandmother   . Hypertension Maternal Grandmother   . Kidney disease Maternal Grandmother   . Arthritis Maternal Grandmother   . Kidney disease Paternal Grandfather     Social History: Have reviewed social history from my progress note on 09/20/2017.  Pt is married.  He is unemployed.  He has a 61-year-old daughter whose name is Jose Merritt.  He reports his daughter as a major positive factor in his life.  His wife is supportive. Social History   Socioeconomic History  . Marital status: Married    Spouse name: Jose Merritt  . Number of children: 1  . Years of education: Not on file  . Highest education level: Associate degree: occupational, Scientist, product/process development, or vocational program  Occupational History  . 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 Financial resource strain: Not hard at all  . Food insecurity:    Worry: Never true    Inability: Never true  . Transportation needs:    Medical: No    Non-medical: No  Tobacco Use  . Smoking status: Never Smoker  . Smokeless tobacco:  Never Used  Substance and Sexual Activity  . Alcohol use: No    Comment: rare- 2x/year  . Drug use: No  . Sexual activity: Yes    Comment: impotence post heart attack  Lifestyle  . Physical activity:    Days per week: 0 days    Minutes per session: 0 min  . Stress: Not at all  Relationships  . Social connections:    Talks on phone: More than three times a week    Gets together: Once a week    Attends religious service: More than 4 times per year    Active member of club or organization: No    Attends meetings of clubs or organizations: Never    Relationship status: Married  Other Topics Concern  . 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 Metrizamide Anaphylaxis  . Other Anaphylaxis    Shrimp  . Sulfa Antibiotics Anaphylaxis    Uncoded Allergy. Allergen: CT contrast dye. Tolerated contrast for heart catheterization 07/2015  . Contrast Media [Iodinated Diagnostic Agents]     Metabolic Disorder Labs: Lab Results  Component Value Date   HGBA1C 13.4 (H) 08/05/2017   No results found for: PROLACTIN Lab Results  Component Value Date   CHOL 144 08/05/2017   TRIG 147.0 08/05/2017   HDL 31.20 (L) 08/05/2017   CHOLHDL 5 08/05/2017   VLDL 29.4 08/05/2017   LDLCALC 83 08/05/2017   LDLCALC 70 05/01/2017   Lab Results  Component Value Date   TSH 2.31 08/05/2017    Therapeutic Level Labs: No results found for: LITHIUM Lab Results  Component Value Date   VALPROATE <4 (L) 10/05/2017   No components found for:  CBMZ  Current Medications: Current Outpatient Medications  Medication Sig Dispense Refill  . atorvastatin (LIPITOR) 80 MG tablet Take 1 tablet (80 mg total) by mouth daily. qam 90 tablet 2  . clopidogrel (PLAVIX) 75 MG tablet Take 1 tablet (75 mg total) by mouth daily. qam 90 tablet 2  . ezetimibe (ZETIA) 10 MG tablet  Take 1 tablet (10 mg total) by mouth daily. 90 tablet 3  . hydrOXYzine (VISTARIL) 25 MG capsule Take 1-2 capsules (25-50 mg total) by mouth 2 (two) times daily as needed (FOR ANXIETY, SLEEP). 120 capsule 1  . INS SYRINGE/NEEDLE 1CC/28G (B-D INSULIN SYRINGE 1CC/28G) 28G X 1/2" 1 ML MISC Use daily for insulin administration 100 each 11  . insulin glargine (LANTUS) 100 UNIT/ML injection Inject 0.25 mLs (25 Units total) into the skin daily. Increase by 1 units/day  until fasting BS are < 150 10 mL 11  . insulin lispro (HUMALOG) 100 UNIT/ML KiwkPen Inject 0.12 mLs (12 Units total) into the skin 3 (three) times daily with meals. 15 mL 4  . Insulin Pen Needle 33G X 5 MM MISC 1 application by Does not apply route 4 (four) times daily as needed. 100 each 3  . lisinopril (PRINIVIL,ZESTRIL) 5 MG tablet Take 1 tablet (5 mg total) by mouth daily. qam 90 tablet 2  . lithium carbonate 150 MG capsule Take 1 capsule (150 mg total) by mouth 2 (two) times daily with a meal. Mood sx 60 capsule 1  . metoprolol succinate (TOPROL-XL) 100 MG 24 hr tablet Take 1 tablet (100 mg total) by mouth daily. Take with or immediately following a meal. qam 90 tablet 2  . nitroGLYCERIN (NITROSTAT) 0.3 MG SL tablet Place 1 tablet (0.3 mg total) under the tongue every 5 (five) minutes as needed. 90 tablet 0  . rivaroxaban (XARELTO) 2.5 MG TABS tablet Take 1 tablet (2.5 mg total) by mouth 2 (two) times daily. 60 tablet 2  . sertraline (ZOLOFT) 50 MG tablet Take 1 tablet (50 mg total) by mouth daily. 90 tablet 0  . spironolactone (ALDACTONE) 25 MG tablet Take 0.5 tablets (12.5 mg total) by mouth daily. qam 45 tablet 2   No current facility-administered medications for this visit.      Musculoskeletal: Strength & Muscle Tone: within normal limits Gait & Station: normal Patient leans: N/A  Psychiatric Specialty Exam: Review of Systems  Psychiatric/Behavioral: The patient is nervous/anxious and has insomnia.   All other systems  reviewed and are negative.   Blood pressure 124/77, pulse 94, weight 208 lb 12.8 oz (94.7 kg).Body mass index is 28.32 kg/m.  General Appearance: Casual  Eye Contact:  Fair  Speech:  Clear and Coherent  Volume:  Normal  Mood:  Anxious and Dysphoric  Affect:  Congruent  Thought Process:  Goal Directed and Descriptions of Associations: Intact  Orientation:  Full (Time, Place, and Person)  Thought Content: Logical   Suicidal Thoughts:  No  Homicidal Thoughts:  No  Memory:  Immediate;   Fair Recent;   Fair Remote;   Fair  Judgement:  Fair  Insight:  Fair  Psychomotor Activity:  Normal  Concentration:  Concentration: Fair and Attention Span: Fair  Recall:  [employer] of Knowledge: Fair  Language: Fair  Akathisia:  No  Handed:  Right  AIMS (if indicated): na  Assets:  Communication Skills Desire for Improvement Financial Resources/Insurance Social Support  ADL's:  Intact  Cognition: WNL  Sleep:  restless at times   Screenings: PHQ2-9     Office Visit from 10/11/2017 in [facility] [place] Office Visit from 09/06/2017 in [facility] [place] Office Visit from 04/01/2017 in [facility]  Patient Outreach from 02/03/2017 in [facility] Patient Outreach from 01/11/2017 in [facility]  PHQ-2 Total Score  4  2  0  0  6  PHQ-9 Total Score  13  7  -  2  12       Assessment and Plan: Boykin is a 42 year old Caucasian male who has a history of bipolar disorder, insomnia, multiple medical problems including congestive heart failure, diabetes mellitus, neuropathy, presented to the clinic today for a follow-up visit.  Patient reports he is tolerating the lithium well.  He has also started psychotherapy with Jose Merritt.  Patient had lithium levels done this a.m.-pending report.  Plan as noted below.  Plan For bipolar disorder Continue Zoloft 50 mg p.o. daily.  Started on Zoloft a year ago by Dr. Edwin Dada. Continue lithium  150 mg p.o. twice daily.  Lithium level done this a.m.-pending report.   For insomnia Patient continues to struggle with sleep on and off. Discussed with him to take hydroxyzine as needed, he has not yet started it.  Anxiety symptoms Zoloft 50 mg p.o. daily  He will continue psychotherapy with Jose Merritt.  Follow-up in clinic in 1 month or sooner if needed.  More than 50 % of the time was spent for psychoeducation and supportive psychotherapy and care coordination.  This note was generated in part or whole with voice recognition software. Voice recognition is usually quite accurate but there are transcription errors that can and very often do occur. I apologize for any typographical errors that were not detected and corrected.         Jomarie Longs, MD 10/21/2017, 1:33 PM

## 2017-10-22 LAB — LITHIUM LEVEL: Lithium Lvl: <0.1 mmol/L — ABNORMAL LOW (ref 0.6–1.2)

## 2017-11-16 DIAGNOSIS — M7732 Calcaneal spur, left foot: Secondary | ICD-10-CM | POA: Diagnosis not present

## 2017-11-16 DIAGNOSIS — R2242 Localized swelling, mass and lump, left lower limb: Secondary | ICD-10-CM | POA: Diagnosis not present

## 2017-11-16 DIAGNOSIS — R031 Nonspecific low blood-pressure reading: Secondary | ICD-10-CM | POA: Diagnosis not present

## 2017-11-16 DIAGNOSIS — M79672 Pain in left foot: Secondary | ICD-10-CM | POA: Diagnosis not present

## 2017-11-18 ENCOUNTER — Emergency Department
Admission: EM | Admit: 2017-11-18 | Discharge: 2017-11-18 | Disposition: A | Discharge status: Home / Self Care | Payer: 59 | Attending: Emergency Medicine | Admitting: Emergency Medicine

## 2017-11-18 ENCOUNTER — Other Ambulatory Visit: Payer: Self-pay

## 2017-11-18 ENCOUNTER — Emergency Department: Payer: 59

## 2017-11-18 DIAGNOSIS — Z794 Long term (current) use of insulin: Secondary | ICD-10-CM | POA: Insufficient documentation

## 2017-11-18 DIAGNOSIS — M79672 Pain in left foot: Secondary | ICD-10-CM | POA: Diagnosis not present

## 2017-11-18 DIAGNOSIS — L03116 Cellulitis of left lower limb: Secondary | ICD-10-CM | POA: Diagnosis not present

## 2017-11-18 DIAGNOSIS — I5042 Chronic combined systolic (congestive) and diastolic (congestive) heart failure: Secondary | ICD-10-CM | POA: Insufficient documentation

## 2017-11-18 DIAGNOSIS — Z79899 Other long term (current) drug therapy: Secondary | ICD-10-CM | POA: Diagnosis not present

## 2017-11-18 DIAGNOSIS — I2510 Atherosclerotic heart disease of native coronary artery without angina pectoris: Secondary | ICD-10-CM | POA: Diagnosis not present

## 2017-11-18 DIAGNOSIS — E119 Type 2 diabetes mellitus without complications: Secondary | ICD-10-CM | POA: Insufficient documentation

## 2017-11-18 DIAGNOSIS — I110 Hypertensive heart disease with heart failure: Secondary | ICD-10-CM | POA: Insufficient documentation

## 2017-11-18 DIAGNOSIS — M7989 Other specified soft tissue disorders: Secondary | ICD-10-CM | POA: Diagnosis not present

## 2017-11-18 LAB — CBC WITH DIFFERENTIAL/PLATELET
Basophils Absolute: 0 10*3/uL (ref 0–0.1)
Basophils Relative: 1 %
Eosinophils Absolute: 0.1 10*3/uL (ref 0–0.7)
Eosinophils Relative: 2 %
HCT: 44.6 % (ref 40.0–52.0)
Hemoglobin: 15.2 g/dL (ref 13.0–18.0)
LYMPHS PCT: 29 %
Lymphs Abs: 1.6 10*3/uL (ref 1.0–3.6)
MCH: 30.5 pg (ref 26.0–34.0)
MCHC: 34 g/dL (ref 32.0–36.0)
MCV: 89.6 fL (ref 80.0–100.0)
MONO ABS: 0.6 10*3/uL (ref 0.2–1.0)
MONOS PCT: 11 %
NEUTROS ABS: 3.2 10*3/uL (ref 1.4–6.5)
Neutrophils Relative %: 57 %
Platelets: 233 10*3/uL (ref 150–440)
RBC: 4.98 MIL/uL (ref 4.40–5.90)
RDW: 14 % (ref 11.5–14.5)
WBC: 5.6 10*3/uL (ref 3.8–10.6)

## 2017-11-18 LAB — COMPREHENSIVE METABOLIC PANEL
ALBUMIN: 4.2 g/dL (ref 3.5–5.0)
ALK PHOS: 103 U/L (ref 38–126)
ALT: 17 U/L (ref 17–63)
AST: 13 U/L — ABNORMAL LOW (ref 15–41)
Anion gap: 11 (ref 5–15)
BUN: 17 mg/dL (ref 6–20)
CALCIUM: 8.9 mg/dL (ref 8.9–10.3)
CO2: 21 mmol/L — AB (ref 22–32)
Chloride: 101 mmol/L (ref 101–111)
Creatinine, Ser: 0.69 mg/dL (ref 0.61–1.24)
GFR calc Af Amer: >60 mL/min (ref >60)
GFR calc non Af Amer: >60 mL/min (ref >60)
GLUCOSE: 276 mg/dL — AB (ref 65–99)
POTASSIUM: 4.3 mmol/L (ref 3.5–5.1)
SODIUM: 133 mmol/L — AB (ref 135–145)
Total Bilirubin: 1.4 mg/dL — ABNORMAL HIGH (ref 0.3–1.2)
Total Protein: 7.7 g/dL (ref 6.5–8.1)

## 2017-11-18 LAB — LACTIC ACID, PLASMA: Lactic Acid, Venous: 0.9 mmol/L (ref 0.5–1.9)

## 2017-11-18 MED ORDER — LEVOFLOXACIN 750 MG PO TABS
750.0000 mg | ORAL_TABLET | Freq: Once | ORAL | Status: AC
  Administered 2017-11-18: 750 mg via ORAL
  Filled 2017-11-18: qty 1

## 2017-11-18 MED ORDER — CEPHALEXIN 500 MG PO CAPS
500.0000 mg | ORAL_CAPSULE | Freq: Once | ORAL | Status: AC
Start: 2017-11-18 — End: 2017-11-18
  Administered 2017-11-18: 500 mg via ORAL
  Filled 2017-11-18: qty 1

## 2017-11-18 MED ORDER — LEVOFLOXACIN 750 MG PO TABS: 750.0000 mg | ORAL_TABLET | Freq: Every day | ORAL | 0 refills | Status: AC

## 2017-11-18 MED ORDER — CEPHALEXIN 500 MG PO CAPS: 500.0000 mg | ORAL_CAPSULE | Freq: Three times a day (TID) | ORAL | 0 refills | Status: AC

## 2017-11-18 NOTE — ED Provider Notes (Signed)
[facility] Emergency Department Provider Note  ____________________________________________  Time seen: Approximately 12:51 PM  I have reviewed the triage vital signs and the nursing notes.   HISTORY  Chief Complaint Wound Infection   HPI Jose Merritt is a 42 y.o. male with a history of type 2 diabetes who presents for evaluation of infection on his foot.  Patient reports that 2 weeks ago he went camping on the beach.  He was walking bare feet on top of some rocks in the ocean.  As soon as he came back from the camping trip he started having pain on the sole of his left foot.  2 days ago he was noted to small blisters in the sole of the foot and went to urgent care where he was started on doxycycline.  He is reporting no changes in the infection since started antibiotics 2 days ago.  He denies fever chills, nausea or vomiting.  He reports pain that is sharp worse with weightbearing, but it is alleviated with elevating of his foot.  The pain is located on the sole of his left foot at the site of 2 blisters.  His sugars have been well controlled at home.  Past Medical History:  Diagnosis Date  . Asthma   . Bipolar disorder (HCC)   . CHF (congestive heart failure) (HCC)   . Chicken pox   . Clotting disorder (HCC)    lung  . Depression   . Diabetes mellitus without complication (HCC) 2003   Type 2  . Heart murmur    as child  . Hyperlipidemia   . Hypertension   . Myocardial infarction (HCC) 06/2015  . Retinopathy of both eyes 01/2016  . SOB (shortness of breath) 08/17/2016  . Stroke [facility])     Patient Active Problem List   Diagnosis Date Noted  . TIA due to embolism (HCC) 10/07/2017  . Neck pain 08/04/2017  . Aphasia 04/30/2017  . Coronary artery disease of native artery of native heart with stable angina pectoris (HCC) 02/07/2017  . Type 2 diabetes mellitus with circulatory disorder (HCC) 02/07/2017  . Chronic combined systolic and  diastolic CHF (congestive heart failure) (HCC) 02/07/2017  . Heart failure (HCC) 11/18/2016  . Bipolar disorder, in partial remission, most recent episode depressed (HCC) 09/30/2016  . HTN (hypertension) 09/30/2016  . Mixed hyperlipidemia 09/30/2016  . History of CVA (cerebrovascular accident) 09/30/2016  . History of MI (myocardial infarction) 09/30/2016    Past Surgical History:  Procedure Laterality Date  . CORONARY ANGIOPLASTY WITH STENT PLACEMENT Left 06/2015   LADx1 stent  . WISDOM TOOTH EXTRACTION      Prior to Admission medications   Medication Sig Start Date End Date Taking? Authorizing Provider  atorvastatin (LIPITOR) 80 MG tablet Take 1 tablet (80 mg total) by mouth daily. qam 08/05/17   Allegra Grana, FNP  cephALEXin (KEFLEX) 500 MG capsule Take 1 capsule (500 mg total) by mouth 3 (three) times daily for 7 days. 11/18/17 11/25/17  Nita Sickle, MD  clopidogrel (PLAVIX) 75 MG tablet Take 1 tablet (75 mg total) by mouth daily. qam 08/05/17   Allegra Grana, FNP  ezetimibe (ZETIA) 10 MG tablet Take 1 tablet (10 mg total) by mouth daily. 10/11/17   Allegra Grana, FNP  hydrOXYzine (VISTARIL) 25 MG capsule Take 1-2 capsules (25-50 mg total) by mouth 2 (two) times daily as needed (FOR ANXIETY, SLEEP). 09/20/17   Jomarie Longs, MD  INS SYRINGE/NEEDLE 1CC/28G (B-D INSULIN  SYRINGE 1CC/28G) 28G X 1/2" 1 ML MISC Use daily for insulin administration 09/06/17   Allegra Grana, FNP  insulin glargine (LANTUS) 100 UNIT/ML injection Inject 0.25 mLs (25 Units total) into the skin daily. Increase by 1 units/day until fasting BS are < 150 08/06/17   Arnett, Lyn Records, FNP  insulin lispro (HUMALOG) 100 UNIT/ML KiwkPen Inject 0.12 mLs (12 Units total) into the skin 3 (three) times daily with meals. 08/06/17   Allegra Grana, FNP  Insulin Pen Needle 33G X 5 MM MISC 1 application by Does not apply route 4 (four) times daily as needed. 04/01/17   Allegra Grana, FNP  levofloxacin  (LEVAQUIN) 750 MG tablet Take 1 tablet (750 mg total) by mouth daily for 7 days. 11/18/17 11/25/17  Nita Sickle, MD  lisinopril (PRINIVIL,ZESTRIL) 5 MG tablet Take 1 tablet (5 mg total) by mouth daily. qam 08/05/17   Allegra Grana, FNP  lithium carbonate 150 MG capsule Take 1 capsule (150 mg total) by mouth 2 (two) times daily with a meal. Mood sx 10/21/17   Jomarie Longs, MD  metoprolol succinate (TOPROL-XL) 100 MG 24 hr tablet Take 1 tablet (100 mg total) by mouth daily. Take with or immediately following a meal. qam 08/05/17   Arnett, Lyn Records, FNP  nitroGLYCERIN (NITROSTAT) 0.3 MG SL tablet Place 1 tablet (0.3 mg total) under the tongue every 5 (five) minutes as needed. 11/18/16   Allegra Grana, FNP  rivaroxaban (XARELTO) 2.5 MG TABS tablet Take 1 tablet (2.5 mg total) by mouth 2 (two) times daily. 10/11/17   Allegra Grana, FNP  sertraline (ZOLOFT) 50 MG tablet Take 1 tablet (50 mg total) by mouth daily. 10/21/17   Jomarie Longs, MD  spironolactone (ALDACTONE) 25 MG tablet Take 0.5 tablets (12.5 mg total) by mouth daily. qam 08/05/17   Allegra Grana, FNP    Allergies Metrizamide; Other; Sulfa antibiotics; and Contrast media [iodinated diagnostic agents]  Family History  Problem Relation Age of Onset  . Hypertension Mother   . Arthritis Mother   . Heart disease Father        3 heart attacks  . Diabetes Father   . Hypertension Father   . Hyperlipidemia Father   . Alcohol abuse Father   . Hypertension Brother   . Hyperlipidemia Brother   . Heart disease Maternal Uncle   . Heart disease Paternal Aunt   . Heart disease Maternal Grandfather   . Hyperlipidemia Maternal Grandfather   . Diabetes Maternal Grandfather   . Kidney disease Maternal Grandfather   . Heart disease Paternal Grandmother   . Kidney disease Paternal Grandmother   . Hypertension Maternal Grandmother   . Kidney disease Maternal Grandmother   . Arthritis Maternal Grandmother   . Kidney disease  Paternal Grandfather     Social History Social History   Tobacco Use  . Smoking status: Never Smoker  . Smokeless tobacco: Never Used  Substance Use Topics  . Alcohol use: No    Comment: rare- 2x/year  . Drug use: No    Review of Systems  Constitutional: Negative for fever. Eyes: Negative for visual changes. ENT: Negative for sore throat. Neck: No neck pain  Cardiovascular: Negative for chest pain. Respiratory: Negative for shortness of breath. Gastrointestinal: Negative for abdominal pain, vomiting or diarrhea. Genitourinary: Negative for dysuria. Musculoskeletal: Negative for back pain. + foot pain and infection Skin: Negative for rash. Neurological: Negative for headaches, weakness or numbness. Psych: No SI or HI  ____________________________________________   PHYSICAL EXAM:  VITAL SIGNS: ED Triage Vitals [11/18/17 1129]  Enc Vitals Group     BP (!) 119/93     Pulse Rate 94     Resp 17     Temp 97.7 F (36.5 C)     Temp Source Oral     SpO2 98 %     Weight 198 lb (89.8 kg)     Height 6' (1.829 m)     Head Circumference      Peak Flow      Pain Score 5     Pain Loc      Pain Edu?      Excl. in GC?     Constitutional: Alert and oriented. Well appearing and in no apparent distress. HEENT:      Head: Normocephalic and atraumatic.         Eyes: Conjunctivae are normal. Sclera is non-icteric.       Mouth/Throat: Mucous membranes are moist.       Neck: Supple with no signs of meningismus. Cardiovascular: Regular rate and rhythm. No murmurs, gallops, or rubs. 2+ symmetrical distal pulses are present in all extremities. No JVD. Respiratory: Normal respiratory effort. Lungs are clear to auscultation bilaterally. No wheezes, crackles, or rhonchi.  Musculoskeletal: There are two small blisters on the bottom of his foot with mild surrounding erythema and warmth, no crepitus. Neurologic: Normal speech and language. Face is symmetric. Moving all extremities. No  gross focal neurologic deficits are appreciated. Skin: Skin is warm, dry and intact. No rash noted. Psychiatric: Mood and affect are normal. Speech and behavior are normal.     ____________________________________________   LABS (all labs ordered are listed, but only abnormal results are displayed)  Labs Reviewed  COMPREHENSIVE METABOLIC PANEL - Abnormal; Notable for the following components:      Result Value   Sodium 133 (*)    CO2 21 (*)    Glucose, Bld 276 (*)    AST 13 (*)    Total Bilirubin 1.4 (*)    All other components within normal limits  CULTURE, BLOOD (SINGLE)  LACTIC ACID, PLASMA  CBC WITH DIFFERENTIAL/PLATELET  LACTIC ACID, PLASMA  URINALYSIS, COMPLETE (UACMP) WITH MICROSCOPIC   ____________________________________________  EKG  none  ____________________________________________  RADIOLOGY  I have personally reviewed the images performed during this visit and I agree with the Radiologist's read.   Interpretation by Radiologist:  Dg Foot Complete Left  Result Date: 11/18/2017 CLINICAL DATA:  Foot wound inferiorly with pain and swelling, initial encounter EXAM: LEFT FOOT - COMPLETE 3+ VIEW COMPARISON:  None. FINDINGS: No acute fracture or dislocation is noted. Diffuse vascular calcifications are noted. No bony erosion to suggest osteomyelitis is seen. No radiopaque foreign body is noted. IMPRESSION: L evidence of osteomyelitis.  No acute abnormality noted. Electronically Signed   By: Alcide Clever M.D.   On: 11/18/2017 12:27     ____________________________________________   PROCEDURES  Procedure(s) performed: None Procedures Critical Care performed:  None ____________________________________________   INITIAL IMPRESSION / ASSESSMENT AND PLAN / ED COURSE  42 y.o. male with a history of type 2 diabetes who presents for evaluation of infection on his foot.  Patient has a site with 2 small blisters on the sole of his left foot with mild surrounding  cellulitis.  There are no signs of sepsis with normal vitals, normal white count.  XR with no air and no evidence of osteo. His sugars have been well controlled at home  usually in the 200 range per patient.  This injury happened 2 weeks ago while he was walking bare feet in the ocean.  He has been on doxycycline for 2 days with no significant relief.  At this time I think it is important not only to cover for vibrio but also Pseudomonas especially on a patient who is a diabetic and also skin flora. Will broaden his abx to levaquin, doxy, and keflex.  I discussed side effects of Levaquin including prolonged QTC, arrhythmia, psychiatric dysfunction, and tendinitis/tendon rupture with patient.  This is an antibiotic that I usually do not like to prescribe to patients in general however unfortunately is the only oral option available that would cover Pseudomonas.  Not covering with that antibiotics may lead to worsening infection in a diabetic which may cause significant morbidity for the patient.  Patient understands the risks.  I have looked at recent EKG from a month ago and there is no evidence of prolonged QTC.  Discussed strict return precautions and close follow-up with primary care doctor to ensure that the wound is healing.      As part of my medical decision making, I reviewed the following data within the electronic MEDICAL RECORD NUMBER Nursing notes reviewed and incorporated, Labs reviewed , Old chart reviewed, Radiograph reviewed , Notes from prior ED visits and [place] Controlled Substance Database    Pertinent labs & imaging results that were available during my care of the patient were reviewed by me and considered in my medical decision making (see chart for details).    ____________________________________________   FINAL CLINICAL IMPRESSION(S) / ED DIAGNOSES  Final diagnoses:  Cellulitis of left foot      NEW MEDICATIONS STARTED DURING THIS VISIT:  ED Discharge Orders        Ordered      cephALEXin (KEFLEX) 500 MG capsule  3 times daily     11/18/17 1251    levofloxacin (LEVAQUIN) 750 MG tablet  Daily     11/18/17 1251       Note:  This document was prepared using Dragon voice recognition software and may include unintentional dictation errors.    Don Perking, [place], MD 11/18/17 1257

## 2017-11-18 NOTE — ED Notes (Signed)
RN called lab to confirm blood culture was received. Lab has blood culture and will run test.

## 2017-11-18 NOTE — ED Notes (Signed)
Patient called to be roomed x1, no response noted at this time from patient.  Staff checked restrooms, lobby, and outdoor facility.   

## 2017-11-18 NOTE — Discharge Instructions (Signed)

## 2017-11-18 NOTE — ED Triage Notes (Addendum)
Pt c/o having a closed wound to the left heel with swelling and pain for the past 2 weeks, states he was seen at PCP on Monday and prescribed doxycycline . States it has gotten worse. Pt is a diabetic on an insulin pump. CBG this morning 177

## 2017-11-18 NOTE — ED Notes (Signed)
Two small circular blister like areas noted to pts left heal. Pain reported while walking. No redness noted around the site and no redness running up pts legs. Skin is dry on left food.

## 2017-11-23 LAB — CULTURE, BLOOD (SINGLE): Culture: NO GROWTH

## 2017-12-01 DIAGNOSIS — E1165 Type 2 diabetes mellitus with hyperglycemia: Secondary | ICD-10-CM | POA: Diagnosis not present

## 2017-12-01 DIAGNOSIS — E1159 Type 2 diabetes mellitus with other circulatory complications: Secondary | ICD-10-CM | POA: Diagnosis not present

## 2017-12-01 DIAGNOSIS — E11621 Type 2 diabetes mellitus with foot ulcer: Secondary | ICD-10-CM | POA: Diagnosis not present

## 2017-12-01 DIAGNOSIS — E1142 Type 2 diabetes mellitus with diabetic polyneuropathy: Secondary | ICD-10-CM | POA: Diagnosis not present

## 2017-12-01 DIAGNOSIS — Z794 Long term (current) use of insulin: Secondary | ICD-10-CM | POA: Diagnosis not present

## 2017-12-03 DIAGNOSIS — E1165 Type 2 diabetes mellitus with hyperglycemia: Secondary | ICD-10-CM | POA: Diagnosis not present

## 2017-12-03 DIAGNOSIS — E1159 Type 2 diabetes mellitus with other circulatory complications: Secondary | ICD-10-CM | POA: Diagnosis not present

## 2017-12-03 DIAGNOSIS — Z794 Long term (current) use of insulin: Secondary | ICD-10-CM | POA: Diagnosis not present

## 2017-12-03 DIAGNOSIS — E11621 Type 2 diabetes mellitus with foot ulcer: Secondary | ICD-10-CM | POA: Diagnosis not present

## 2017-12-03 DIAGNOSIS — E1142 Type 2 diabetes mellitus with diabetic polyneuropathy: Secondary | ICD-10-CM | POA: Diagnosis not present

## 2017-12-07 DIAGNOSIS — L03116 Cellulitis of left lower limb: Secondary | ICD-10-CM | POA: Diagnosis not present

## 2017-12-07 DIAGNOSIS — E1142 Type 2 diabetes mellitus with diabetic polyneuropathy: Secondary | ICD-10-CM | POA: Diagnosis not present

## 2017-12-16 DIAGNOSIS — F3162 Bipolar disorder, current episode mixed, moderate: Secondary | ICD-10-CM | POA: Diagnosis not present

## 2018-03-02 ENCOUNTER — Encounter: Payer: Self-pay | Admitting: Family Medicine

## 2018-03-02 ENCOUNTER — Ambulatory Visit: Payer: 59 | Admitting: Family Medicine

## 2018-03-02 ENCOUNTER — Encounter: Payer: Self-pay | Admitting: Family

## 2018-03-02 VITALS — BP 128/98 | HR 96 | Temp 98.7°F | Ht 72.0 in | Wt 203.8 lb

## 2018-03-02 DIAGNOSIS — R070 Pain in throat: Secondary | ICD-10-CM | POA: Diagnosis not present

## 2018-03-02 DIAGNOSIS — Z20818 Contact with and (suspected) exposure to other bacterial communicable diseases: Secondary | ICD-10-CM

## 2018-03-02 DIAGNOSIS — R0982 Postnasal drip: Secondary | ICD-10-CM

## 2018-03-02 LAB — POCT RAPID STREP A (OFFICE): RAPID STREP A SCREEN: NEGATIVE

## 2018-03-02 MED ORDER — AMOXICILLIN 500 MG PO CAPS: 500.0000 mg | ORAL_CAPSULE | Freq: Two times a day (BID) | ORAL | 0 refills | Status: DC

## 2018-03-02 NOTE — Progress Notes (Signed)
Subjective:    Patient ID: Jose Merritt, male    DOB: 29-Jun-1975, 42 y.o.   MRN: [id number]  HPI   Patient presents to clinic c/o severe sore throat x1 day. States his wife was diagnosed with strep throat and is currently being treated with PCN, she is on day 3 of her treatment. Patient also has 36 year old child, child is not sick currently, but children in the child's class at school have been. He denies any fever or chills. Denies nausea, vomiting or diarrhea.   Patient Active Problem List   Diagnosis Date Noted  . TIA due to embolism (HCC) 10/07/2017  . Neck pain 08/04/2017  . Aphasia 04/30/2017  . Coronary artery disease of native artery of native heart with stable angina pectoris (HCC) 02/07/2017  . Type 2 diabetes mellitus with circulatory disorder (HCC) 02/07/2017  . Chronic combined systolic and diastolic CHF (congestive heart failure) (HCC) 02/07/2017  . Heart failure (HCC) 11/18/2016  . Bipolar disorder, in partial remission, most recent episode depressed (HCC) 09/30/2016  . HTN (hypertension) 09/30/2016  . Mixed hyperlipidemia 09/30/2016  . History of CVA (cerebrovascular accident) 09/30/2016  . History of MI (myocardial infarction) 09/30/2016   Social History   Tobacco Use  . Smoking status: Never Smoker  . Smokeless tobacco: Never Used  Substance Use Topics  . Alcohol use: No    Comment: rare- 2x/year   Review of Systems  Constitutional: Negative for chills, fatigue and fever.  HENT: +congestion, and sore throat.   Eyes: Negative.   Respiratory: Negative for cough, shortness of breath and wheezing.   Cardiovascular: Negative for chest pain, palpitations and leg swelling.  Gastrointestinal: Negative for abdominal pain, diarrhea, nausea and vomiting.  Genitourinary: Negative for dysuria, frequency and urgency.  Musculoskeletal: Negative for arthralgias and myalgias.  Skin: Negative for color change, pallor and rash.  Neurological: Negative for  syncope, light-headedness and headaches.  Psychiatric/Behavioral: The patient is not nervous/anxious.    Objective:   Physical Exam  Constitutional: He is oriented to person, place, and time.  Non-toxic appearance. He does not appear ill. No distress.  HENT:  Head: Normocephalic and atraumatic.  Right Ear: Hearing and tympanic membrane normal.  Left Ear: Hearing and tympanic membrane normal.  +redness and post nasal drip back of throat. No exudates seen on tonsils.  Eyes: Pupils are equal, round, and reactive to light. EOM are normal.  Neck: Normal range of motion. Neck supple.  Gland tenderness.  Cardiovascular: Normal rate and normal heart sounds.  Pulmonary/Chest: Effort normal and breath sounds normal. No respiratory distress. He has no wheezes. He has no rhonchi. He has no rales.  Neurological: He is alert and oriented to person, place, and time.  Skin: Skin is warm and dry. No pallor.  Psychiatric: He has a normal mood and affect. His behavior is normal.  Nursing note and vitals reviewed.  Vitals:   03/02/18 0812  BP: (!) 128/98  Pulse: 96  Temp: 98.7 F (37.1 C)  SpO2: 96%      Assessment & Plan:   Strep throat exposure/pain in throat/PND -- rapid strep in clinic is negative, but due to close contact strep throat exposure we will treat with amoxicillin twice daily for 10 days.  Patient advised to take over-the-counter antihistamine to help dry up postnasal drip.  Advised to do salt water gargles, use Chloraseptic throat spray, ibuprofen as needed for pain in throat.  Rest, do good handwashing, increase fluid intake.   Also  advised to get a new toothbrush.  Keep regularly scheduled follow-up as planned.  Return to clinic sooner if issues arise.

## 2018-03-02 NOTE — Telephone Encounter (Signed)
Left voicemail for patient to call , needs appointment

## 2018-03-02 NOTE — Patient Instructions (Signed)

## 2018-03-03 NOTE — Telephone Encounter (Signed)
Patient seen for office visit 03/02/18 by Leotis Shames , NP

## 2018-03-04 LAB — UPPER RESPIRATORY CULTURE, ROUTINE

## 2018-03-11 DIAGNOSIS — E1165 Type 2 diabetes mellitus with hyperglycemia: Secondary | ICD-10-CM | POA: Diagnosis not present

## 2018-03-15 DIAGNOSIS — E11621 Type 2 diabetes mellitus with foot ulcer: Secondary | ICD-10-CM | POA: Diagnosis not present

## 2018-03-15 DIAGNOSIS — E1165 Type 2 diabetes mellitus with hyperglycemia: Secondary | ICD-10-CM | POA: Diagnosis not present

## 2018-03-15 DIAGNOSIS — E1159 Type 2 diabetes mellitus with other circulatory complications: Secondary | ICD-10-CM | POA: Diagnosis not present

## 2018-03-15 DIAGNOSIS — E1142 Type 2 diabetes mellitus with diabetic polyneuropathy: Secondary | ICD-10-CM | POA: Diagnosis not present

## 2018-04-27 ENCOUNTER — Encounter: Payer: Self-pay | Admitting: Family

## 2018-04-27 ENCOUNTER — Ambulatory Visit: Payer: 59 | Admitting: Family

## 2018-04-27 VITALS — BP 120/78 | HR 87 | Temp 98.1°F | Resp 16 | Ht 72.0 in | Wt 205.1 lb

## 2018-04-27 DIAGNOSIS — I252 Old myocardial infarction: Secondary | ICD-10-CM | POA: Diagnosis not present

## 2018-04-27 DIAGNOSIS — Z125 Encounter for screening for malignant neoplasm of prostate: Secondary | ICD-10-CM

## 2018-04-27 DIAGNOSIS — E1159 Type 2 diabetes mellitus with other circulatory complications: Secondary | ICD-10-CM | POA: Diagnosis not present

## 2018-04-27 DIAGNOSIS — I5042 Chronic combined systolic (congestive) and diastolic (congestive) heart failure: Secondary | ICD-10-CM

## 2018-04-27 DIAGNOSIS — Z8673 Personal history of transient ischemic attack (TIA), and cerebral infarction without residual deficits: Secondary | ICD-10-CM

## 2018-04-27 DIAGNOSIS — E782 Mixed hyperlipidemia: Secondary | ICD-10-CM

## 2018-04-27 DIAGNOSIS — F3175 Bipolar disorder, in partial remission, most recent episode depressed: Secondary | ICD-10-CM | POA: Diagnosis not present

## 2018-04-27 DIAGNOSIS — I10 Essential (primary) hypertension: Secondary | ICD-10-CM

## 2018-04-27 LAB — GLUCOSE, POCT (MANUAL RESULT ENTRY): POC Glucose: 355 mg/dl — AB (ref 70–99)

## 2018-04-27 MED ORDER — EZETIMIBE 10 MG PO TABS: 10.0000 mg | ORAL_TABLET | Freq: Every day | ORAL | 3 refills | Status: DC

## 2018-04-27 MED ORDER — METOPROLOL SUCCINATE ER 100 MG PO TB24: 100.0000 mg | ORAL_TABLET | Freq: Every day | ORAL | 2 refills | Status: DC

## 2018-04-27 MED ORDER — LISINOPRIL 5 MG PO TABS: 5.0000 mg | ORAL_TABLET | Freq: Every day | ORAL | 2 refills | Status: DC

## 2018-04-27 MED ORDER — RIVAROXABAN 2.5 MG PO TABS: 2.5000 mg | ORAL_TABLET | Freq: Two times a day (BID) | ORAL | 2 refills | Status: DC

## 2018-04-27 MED ORDER — CLOPIDOGREL BISULFATE 75 MG PO TABS: 75.0000 mg | ORAL_TABLET | Freq: Every day | ORAL | 2 refills | Status: DC

## 2018-04-27 MED ORDER — ATORVASTATIN CALCIUM 80 MG PO TABS: 80.0000 mg | ORAL_TABLET | Freq: Every day | ORAL | 2 refills | Status: DC

## 2018-04-27 NOTE — Assessment & Plan Note (Signed)
Pending lipid panel.  Continue regimen.

## 2018-04-27 NOTE — Assessment & Plan Note (Signed)
No longer on Zoloft, lithium.  No longer following with psychiatry.  Very pleased patient seemed upbeat today.  Strongly encouraged him to continue to follow with psychiatry.  Advised to let me know of any worsening of symptoms.  Will follow

## 2018-04-27 NOTE — Assessment & Plan Note (Signed)
Uncontrolled.  Continue to follow with endocrinology.  Pleased that he has recently started Lantus . He will continue to follow with Dr. Lynnea Ferrier.  Will follow

## 2018-04-27 NOTE — Patient Instructions (Addendum)
From Dr Mariah Milling:   stop aspirin and stay on Plavix And start xarelto 2.5 mg po twice daily  Start back on aldactone; please monitor blood pressure to ensure not less than 100/70 ( approximately) and that you do not feel dizzy, lightheaded on this medication.   Ensure you have follow up with dr Mariah Milling.   Happy to see you doing so well.

## 2018-04-27 NOTE — Assessment & Plan Note (Addendum)
Asymptomatic.  Pending PSA.  Education provided this is an annual test,and  let me know of any symptoms.  Patient verbalized understanding

## 2018-04-27 NOTE — Addendum Note (Signed)
Addended by: Warden Fillers on: 04/27/2018 04:01 PM   Modules accepted: Orders

## 2018-04-27 NOTE — Progress Notes (Signed)
Subjective:    Patient ID: Jose Merritt, male    DOB: Oct 28, 1975, 42 y.o.   MRN: [id number]  CC: Jose Merritt is a 43 y.o. male who presents today for follow up.   HPI: HTN- compliant with medications. No CP  H/o CVA- on asa. On xarelto, plavix. No bleeding  DM- FGB 350 which has improved since starting lantus.   restarted lantus 40 units 8 days ago. Following with solum. Solum prescribed kombiglyze XR,  farsiga however didnt start since going back insulin. He sees  her next week. Endorses polyuria, polyphagia. Continued weight loss.   HLD- on zetia and atorvastiatin  Bipolar- depression has improved. 'best ive felt in long time.'  Work has helped.  not on lithium, zoloft; No longer following with dr Jose Merritt. No si/hi.    No urinary hesistancy. Would like to screen for prostate cancer with psa     Solum: added kombiglyze XRm farsiga.  HISTORY:  Past Medical History:  Diagnosis Date  . Asthma   . Bipolar disorder (HCC)   . CHF (congestive heart failure) (HCC)   . Chicken pox   . Clotting disorder (HCC)    lung  . Depression   . Diabetes mellitus without complication (HCC) 2003   Type 2  . Heart murmur    as child  . Hyperlipidemia   . Hypertension   . Myocardial infarction (HCC) 06/2015  . Retinopathy of both eyes 01/2016  . SOB (shortness of breath) 08/17/2016  . Stroke [facility])    Past Surgical History:  Procedure Laterality Date  . CORONARY ANGIOPLASTY WITH STENT PLACEMENT Left 06/2015   LADx1 stent  . WISDOM TOOTH EXTRACTION     Family History  Problem Relation Age of Onset  . Hypertension Mother   . Arthritis Mother   . Heart disease Father        3 heart attacks  . Diabetes Father   . Hypertension Father   . Hyperlipidemia Father   . Alcohol abuse Father   . Hypertension Brother   . Hyperlipidemia Brother   . Heart disease Maternal Uncle   . Heart disease Paternal Aunt   . Heart disease Maternal Grandfather   . Hyperlipidemia  Maternal Grandfather   . Diabetes Maternal Grandfather   . Kidney disease Maternal Grandfather   . Heart disease Paternal Grandmother   . Kidney disease Paternal Grandmother   . Hypertension Maternal Grandmother   . Kidney disease Maternal Grandmother   . Arthritis Maternal Grandmother   . Kidney disease Paternal Grandfather   . Colon cancer Neg Hx     Allergies: Metrizamide; Other; Sulfa antibiotics; and Contrast media [iodinated diagnostic agents] Current Outpatient Medications on File Prior to Visit  Medication Sig Dispense Refill  . INS SYRINGE/NEEDLE 1CC/28G (B-D INSULIN SYRINGE 1CC/28G) 28G X 1/2" 1 ML MISC Use daily for insulin administration 100 each 11  . insulin glargine (LANTUS) 100 UNIT/ML injection Inject 0.25 mLs (25 Units total) into the skin daily. Increase by 1 units/day until fasting BS are < 150 10 mL 11  . insulin lispro (HUMALOG) 100 UNIT/ML KiwkPen Inject 0.12 mLs (12 Units total) into the skin 3 (three) times daily with meals. 15 mL 4  . Insulin Pen Needle 33G X 5 MM MISC 1 application by Does not apply route 4 (four) times daily as needed. 100 each 3  . nitroGLYCERIN (NITROSTAT) 0.3 MG SL tablet Place 1 tablet (0.3 mg total) under the tongue every 5 (five) minutes as  needed. 90 tablet 0  . spironolactone (ALDACTONE) 25 MG tablet Take 0.5 tablets (12.5 mg total) by mouth daily. qam (Patient not taking: Reported on 04/27/2018) 45 tablet 2   No current facility-administered medications on file prior to visit.     Social History   Tobacco Use  . Smoking status: Never Smoker  . Smokeless tobacco: Never Used  Substance Use Topics  . Alcohol use: No    Comment: rare- 2x/year  . Drug use: No    Review of Systems  Constitutional: Negative for chills and fever.  Respiratory: Negative for cough.   Cardiovascular: Negative for chest pain and palpitations.  Gastrointestinal: Negative for nausea and vomiting.  Endocrine: Positive for polydipsia and polyphagia.    Genitourinary: Negative for difficulty urinating.  Psychiatric/Behavioral: Negative for sleep disturbance and suicidal ideas.      Objective:    BP 120/78 (BP Location: Left Arm, Patient Position: Sitting, Cuff Size: Normal)   Pulse 87   Temp 98.1 F (36.7 C) (Oral)   Resp 16   Ht 6' (1.829 m)   Wt 205 lb 2 oz (93 kg)   SpO2 98%   BMI 27.82 kg/m  BP Readings from Last 3 Encounters:  04/27/18 120/78  03/02/18 (!) 128/98  11/18/17 120/85   Wt Readings from Last 3 Encounters:  04/27/18 205 lb 2 oz (93 kg)  03/02/18 203 lb 12.8 oz (92.4 kg)  11/18/17 198 lb (89.8 kg)  .lastp Depression screen [facility] 2/9 04/27/2018 10/11/2017 09/06/2017 04/01/2017 02/03/2017  Decreased Interest 0 2 1 0 0  Down, Depressed, Hopeless 1 2 1  0 0  PHQ - 2 Score 1 4 2  0 0  Altered sleeping 0 3 3 - 2  Tired, decreased energy 0 3 1 - 0  Change in appetite 1 2 - - 0  Feeling bad or failure about yourself  0 0 0 - 0  Trouble concentrating 0 0 0 - 0  Moving slowly or fidgety/restless 0 0 0 - 0  Suicidal thoughts 0 1 1 - 0  PHQ-9 Score 2 13 7  - 2  Difficult doing work/chores Somewhat difficult Extremely dIfficult Somewhat difficult - -    Physical Exam  Constitutional: He appears well-developed and well-nourished.  Cardiovascular: Regular rhythm and normal heart sounds.  Pulmonary/Chest: Effort normal and breath sounds normal. No respiratory distress. He has no wheezes. He has no rhonchi. He has no rales.  Neurological: He is alert.  Skin: Skin is warm and dry.  Psychiatric: He has a normal mood and affect. His speech is normal and behavior is normal.  Vitals reviewed.      Assessment & Plan:   Problem List Items Addressed This Visit      Cardiovascular and Mediastinum   HTN (hypertension) - Primary    At goal.  Continue current regimen.      Relevant Medications   atorvastatin (LIPITOR) 80 MG tablet   ezetimibe (ZETIA) 10 MG tablet   lisinopril (PRINIVIL,ZESTRIL) 5 MG tablet   metoprolol  succinate (TOPROL-XL) 100 MG 24 hr tablet   rivaroxaban (XARELTO) 2.5 MG TABS tablet   Type 2 diabetes mellitus with circulatory disorder (HCC)    Uncontrolled.  Continue to follow with endocrinology.  Pleased that he has recently started Lantus . He will continue to follow with Dr. Lynnea Ferrier.  Will follow      Relevant Medications   atorvastatin (LIPITOR) 80 MG tablet   ezetimibe (ZETIA) 10 MG tablet   lisinopril (PRINIVIL,ZESTRIL) 5 MG  tablet   metoprolol succinate (TOPROL-XL) 100 MG 24 hr tablet   rivaroxaban (XARELTO) 2.5 MG TABS tablet   Chronic combined systolic and diastolic CHF (congestive heart failure) (HCC)    Advised patient to resume Aldactone as started by cardiology. Messaged note to Gollan to ensure this is accurate.   Pending BMP today. Will monitor blood pressure and continue follow-up with Dr. Mariah Milling.  Will follow      Relevant Medications   atorvastatin (LIPITOR) 80 MG tablet   ezetimibe (ZETIA) 10 MG tablet   lisinopril (PRINIVIL,ZESTRIL) 5 MG tablet   metoprolol succinate (TOPROL-XL) 100 MG 24 hr tablet   rivaroxaban (XARELTO) 2.5 MG TABS tablet     Other   Bipolar disorder, in partial remission, most recent episode depressed (HCC)    No longer on Zoloft, lithium.  No longer following with psychiatry.  Very pleased patient seemed upbeat today.  Strongly encouraged him to continue to follow with psychiatry.  Advised to let me know of any worsening of symptoms.  Will follow      Mixed hyperlipidemia    Pending lipid panel.  Continue regimen.      Relevant Medications   atorvastatin (LIPITOR) 80 MG tablet   ezetimibe (ZETIA) 10 MG tablet   lisinopril (PRINIVIL,ZESTRIL) 5 MG tablet   metoprolol succinate (TOPROL-XL) 100 MG 24 hr tablet   rivaroxaban (XARELTO) 2.5 MG TABS tablet   History of CVA (cerebrovascular accident)    Compliant with Plavix, Xarelto. No complaints of bleeding.  Advised him again to stop aspirin.  Patient verbalized understanding of this.       Relevant Medications   atorvastatin (LIPITOR) 80 MG tablet   clopidogrel (PLAVIX) 75 MG tablet   ezetimibe (ZETIA) 10 MG tablet   rivaroxaban (XARELTO) 2.5 MG TABS tablet   History of MI (myocardial infarction)   Relevant Medications   lisinopril (PRINIVIL,ZESTRIL) 5 MG tablet   metoprolol succinate (TOPROL-XL) 100 MG 24 hr tablet   Other Relevant Orders   Comprehensive metabolic panel   Lipid panel   Screening for prostate cancer    Asymptomatic.  Pending PSA.  Education provided this is an annual test,and  let me know of any symptoms.  Patient verbalized understanding      Relevant Orders   PSA       I have discontinued Vania Rea. Nawabi's hydrOXYzine, sertraline, lithium carbonate, and amoxicillin. I am also having him maintain his nitroGLYCERIN, Insulin Pen Needle, spironolactone, insulin lispro, insulin glargine, INS SYRINGE/NEEDLE 1CC/28G, atorvastatin, clopidogrel, ezetimibe, lisinopril, metoprolol succinate, and rivaroxaban.   Meds ordered this encounter  Medications  . atorvastatin (LIPITOR) 80 MG tablet    Sig: Take 1 tablet (80 mg total) by mouth daily. qam    Dispense:  90 tablet    Refill:  2  . clopidogrel (PLAVIX) 75 MG tablet    Sig: Take 1 tablet (75 mg total) by mouth daily. qam    Dispense:  90 tablet    Refill:  2  . ezetimibe (ZETIA) 10 MG tablet    Sig: Take 1 tablet (10 mg total) by mouth daily.    Dispense:  90 tablet    Refill:  3  . lisinopril (PRINIVIL,ZESTRIL) 5 MG tablet    Sig: Take 1 tablet (5 mg total) by mouth daily. qam    Dispense:  90 tablet    Refill:  2  . metoprolol succinate (TOPROL-XL) 100 MG 24 hr tablet    Sig: Take 1 tablet (100 mg total)  by mouth daily. Take with or immediately following a meal. qam    Dispense:  90 tablet    Refill:  2  . rivaroxaban (XARELTO) 2.5 MG TABS tablet    Sig: Take 1 tablet (2.5 mg total) by mouth 2 (two) times daily.    Dispense:  60 tablet    Refill:  2    Return precautions given.    Risks, benefits, and alternatives of the medications and treatment plan prescribed today were discussed, and patient expressed understanding.   Education regarding symptom management and diagnosis given to patient on AVS.  Continue to follow with Allegra Grana, FNP for routine health maintenance.   Dixie Dials and I agreed with plan.   [name], FNP

## 2018-04-27 NOTE — Assessment & Plan Note (Signed)
Compliant with Plavix, Xarelto. No complaints of bleeding.  Advised him again to stop aspirin.  Patient verbalized understanding of this.

## 2018-04-27 NOTE — Assessment & Plan Note (Addendum)
Advised patient to resume Aldactone as started by cardiology. Messaged note to Gollan to ensure this is accurate.   Pending BMP today. Will monitor blood pressure and continue follow-up with Dr. Mariah Milling.  Will follow

## 2018-04-27 NOTE — Assessment & Plan Note (Signed)
At goal. Continue current regimen. 

## 2018-04-27 NOTE — Addendum Note (Signed)
Addended by: Penne Lash on: 04/27/2018 03:40 PM   Modules accepted: Orders

## 2018-05-03 ENCOUNTER — Telehealth: Payer: Self-pay | Admitting: Family

## 2018-05-03 NOTE — Telephone Encounter (Signed)
Call pt  How is he doing? Specifically how are blood sugars?  When does he see Solum again?

## 2018-05-04 ENCOUNTER — Other Ambulatory Visit: Payer: Self-pay

## 2018-05-04 DIAGNOSIS — E113293 Type 2 diabetes mellitus with mild nonproliferative diabetic retinopathy without macular edema, bilateral: Secondary | ICD-10-CM | POA: Diagnosis not present

## 2018-05-04 DIAGNOSIS — E1142 Type 2 diabetes mellitus with diabetic polyneuropathy: Secondary | ICD-10-CM | POA: Diagnosis not present

## 2018-05-04 DIAGNOSIS — E1165 Type 2 diabetes mellitus with hyperglycemia: Secondary | ICD-10-CM | POA: Diagnosis not present

## 2018-05-04 DIAGNOSIS — E1129 Type 2 diabetes mellitus with other diabetic kidney complication: Secondary | ICD-10-CM | POA: Diagnosis not present

## 2018-05-04 NOTE — Telephone Encounter (Signed)
I spoke with patient & he was actually walking in to [facility] Dr. Pricilla Handler office then. He did state that his morning his BS was high at 425.

## 2018-05-04 NOTE — Telephone Encounter (Signed)
noted 

## 2018-06-09 ENCOUNTER — Ambulatory Visit: Payer: Self-pay | Admitting: Cardiovascular Disease

## 2018-06-17 DIAGNOSIS — E1129 Type 2 diabetes mellitus with other diabetic kidney complication: Secondary | ICD-10-CM | POA: Diagnosis not present

## 2018-06-17 DIAGNOSIS — E1165 Type 2 diabetes mellitus with hyperglycemia: Secondary | ICD-10-CM | POA: Diagnosis not present

## 2018-06-17 DIAGNOSIS — E113293 Type 2 diabetes mellitus with mild nonproliferative diabetic retinopathy without macular edema, bilateral: Secondary | ICD-10-CM | POA: Diagnosis not present

## 2018-06-17 DIAGNOSIS — E1142 Type 2 diabetes mellitus with diabetic polyneuropathy: Secondary | ICD-10-CM | POA: Diagnosis not present

## 2018-07-03 NOTE — Progress Notes (Signed)
Cardiology Office Note  Date:  07/04/2018   ID:  Jose Merritt, [place] 29-Oct-1975, MRN [id number]  PCP:  Allegra Grana, FNP   Chief Complaint  Patient presents with  . other    Pt. c/o swelling in ankles at the end of his work day, fluttering in chest with shortness of breath on exertion. Meds reviewed by the pt. verbally.     HPI:  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  Sugars have been running high Has a glucose sensor, past 10 days insulin pump on order  Denies any new TIA symptoms No shortness of breath chest pain No regular exercise program but has access to a gym where he lives  March 2019 LDL in the 80s  EKG personally reviewed by myself on todays visit Shows normal sinus rhythm with rate 95 bpm no significant ST or T wave changes  Other past medical history reviewed Tia x2 November 2018 reports that he was Getting an ice cream, Started complaining of arm pain, people reported he was talking off topic, Left arm, left shoulder numb He went to the hospital, [facility] CT head ok MRI ok Recent carotid ultrasound with no significant disease  Earlier in 2018 also reported having similar symptoms Went to hospital in [place], results not available   left arm numb again No speech problem that time  Details not clear but reports that he was given a medication at the time that he had significant allergy to  Admitted 1/31-07/11/2015 for STEMI. 1 week prior to presentation he had left shoulder pain   treated at urgent  care for bursitis with a steroid injection.   In the few days prior to presentation,   EKG with ST elevation. Cath with occluded mid LAD treated with DES. No residual disease.   Echo with ejection fraction 20% with probable LV thrombus. Started on warfarin. Metoprolol succinate, atorvastatin and lisinopril also initiated.   08/29/15 Repeat Cardiac cath, stable , stents patent Echocardiogram with improved ejection fraction (40%) and resolution of LV apical thrombus. Lifevest stopped Warfarin stopped, but he continued to take   followed by endocrinologist, Dr Vincente Liberty with DM nutritionist  Bipolar-seen psychiatrist;  Previous echocardiogram March 2017, ejection fraction 40%, up from 19%   PMH:   has a past medical history of Asthma, Bipolar disorder (HCC), CHF (congestive heart failure) (HCC), Chicken pox, Clotting disorder (HCC), Depression, Diabetes mellitus without complication (HCC) (2003), Heart murmur, Hyperlipidemia, Hypertension, Myocardial infarction (HCC) (06/2015), Retinopathy of both eyes (01/2016), SOB (shortness of breath) (08/17/2016), and Stroke (HCC).  PSH:    Past Surgical History:  Procedure Laterality Date  . CORONARY ANGIOPLASTY WITH STENT PLACEMENT Left 06/2015   LADx1 stent  . WISDOM TOOTH EXTRACTION      Current Outpatient Medications  Medication Sig Dispense Refill  . atorvastatin (LIPITOR) 80 MG tablet Take 1 tablet (80 mg total) by mouth daily. qam 90 tablet 3  . clopidogrel (PLAVIX) 75 MG tablet Take 1 tablet (75 mg total) by mouth daily. qam 90 tablet 3  . ezetimibe (ZETIA) 10 MG  tablet Take 1 tablet (10 mg total) by mouth daily. 90 tablet 3  . INS SYRINGE/NEEDLE 1CC/28G (B-D INSULIN SYRINGE 1CC/28G) 28G X 1/2" 1 ML MISC Use daily for insulin administration 100 each 11  . insulin glargine (LANTUS) 100 UNIT/ML injection Inject 0.25 mLs (25 Units total) into the skin daily. Increase by 1 units/day until fasting BS are < 150 10 mL 11  . insulin  lispro (HUMALOG) 100 UNIT/ML KiwkPen Inject 0.12 mLs (12 Units total) into the skin 3 (three) times daily with meals. 15 mL 4  . Insulin Pen Needle 33G X 5 MM MISC 1 application by Does not apply route 4 (four) times daily as needed. 100 each 3  . lisinopril (PRINIVIL,ZESTRIL) 5 MG tablet Take 1 tablet (5 mg total) by mouth daily. qam 90 tablet 3  . metoprolol succinate (TOPROL-XL) 100 MG 24 hr tablet Take 1 tablet (100 mg total) by mouth daily. Take with or immediately following a meal. qam 90 tablet 3  . nitroGLYCERIN (NITROSTAT) 0.3 MG SL tablet Place 1 tablet (0.3 mg total) under the tongue every 5 (five) minutes as needed. 90 tablet 0  . rivaroxaban (XARELTO) 2.5 MG TABS tablet Take 1 tablet (2.5 mg total) by mouth daily. 90 tablet 3  . spironolactone (ALDACTONE) 25 MG tablet Take 0.5 tablets (12.5 mg total) by mouth daily. qam 45 tablet 3   No current facility-administered medications for this visit.      Allergies:   Metrizamide; Other; Sulfa antibiotics; and Contrast media [iodinated diagnostic agents]   Social History:  The patient  reports that he has never smoked. He has never used smokeless tobacco. He reports that he does not drink alcohol or use drugs.   Family History:   family history includes Alcohol abuse in his father; Arthritis in his maternal grandmother and mother; Diabetes in his father and maternal grandfather; Heart disease in his father, maternal grandfather, maternal uncle, paternal aunt, and paternal grandmother; Hyperlipidemia in his brother, father, and maternal grandfather; Hypertension in his brother, father, maternal grandmother, and mother; Kidney disease in his maternal grandfather, maternal grandmother, paternal grandfather, and paternal grandmother.    Review of Systems: Review of Systems  Constitutional: Negative.   Cardiovascular: Positive for palpitations.       Fluttering  Gastrointestinal: Negative.   Musculoskeletal: Negative.   Neurological:  Negative.   Psychiatric/Behavioral: Negative.   All other systems reviewed and are negative.  PHYSICAL EXAM: VS:  BP 132/78 (BP Location: Left Arm, Patient Position: Sitting, Cuff Size: Normal)   Pulse 95   Ht 6' (1.829 m)   Wt 215 lb (97.5 kg)   BMI 29.16 kg/m  , BMI Body mass index is 29.16 kg/m. Constitutional:  oriented to person, place, and time. No distress.  HENT:  Head: Grossly normal Eyes:  no discharge. No scleral icterus.  Neck: No JVD, no carotid bruits  Cardiovascular: Regular rate and rhythm, no murmurs appreciated Pulmonary/Chest: Clear to auscultation bilaterally, no wheezes or rails Abdominal: Soft.  no distension.  no tenderness.  Musculoskeletal: Normal range of motion Neurological:  normal muscle tone. Coordination normal. No atrophy Skin: Skin warm and dry Psychiatric: normal affect, pleasant  Recent Labs: 08/05/2017: TSH 2.31 11/18/2017: ALT 17; BUN 17; Creatinine, Ser 0.69; Hemoglobin 15.2; Platelets 233; Potassium 4.3; Sodium 133    Lipid Panel Lab Results  Component Value Date   CHOL 144 08/05/2017   HDL 31.20 (L) 08/05/2017   LDLCALC 83 08/05/2017   TRIG 147.0 08/05/2017  Wt Readings from Last 3 Encounters:  07/04/18 215 lb (97.5 kg)  04/27/18 205 lb 2 oz (93 kg)  03/02/18 203 lb 12.8 oz (92.4 kg)      ASSESSMENT AND PLAN:  Coronary artery disease of native artery of native heart with stable angina pectoris (HCC) - Plan: EKG 12-Lead Tolerating Plavix and Xarelto 2.5 mg po BID Cholesterol above goal secondary to poorly controlled diabetes numbers Stressed importance of aggressive diabetes control He is working with endocrinology  Mixed hyperlipidemia Continue Zetia and Lipitor Recommended weight loss as it appears weight is trending upwards LDL goal less than 70  TIA He was evaluated in the hospital twice for TIA symptoms  Plavix and start Xarelto 2.5 twice daily No further episodes  Type 2 diabetes mellitus with other  circulatory complication, unspecified whether long term insulin use (HCC) Weight continues to trend upwards We have encouraged continued exercise, careful diet management in an effort to lose weight.  Bipolar disorder, in partial remission, most recent episode depressed (HCC) Stable managed by primary care  Chronic combined systolic and diastolic CHF (congestive heart failure) (HCC)  Ejection fraction 40% March 2017 Previously did not want to change his medications to Entresto Appears relatively euvolemic  Disposition:   F/U  12 months   Total encounter time more than 25 minutes  Greater than 50% was spent in counseling and coordination of care with the patient    Orders Placed This Encounter  Procedures  . EKG 12-Lead     Signed, Dossie Arbour, M.D., Ph.D. 07/04/2018  [facility] [place], [place] [phone]

## 2018-07-04 ENCOUNTER — Telehealth: Payer: Self-pay | Admitting: Family

## 2018-07-04 ENCOUNTER — Encounter: Payer: Self-pay | Admitting: Cardiovascular Disease

## 2018-07-04 ENCOUNTER — Ambulatory Visit: Payer: 59 | Admitting: Cardiovascular Disease

## 2018-07-04 ENCOUNTER — Other Ambulatory Visit: Payer: Self-pay

## 2018-07-04 VITALS — BP 132/78 | HR 95 | Ht 72.0 in | Wt 215.0 lb

## 2018-07-04 DIAGNOSIS — I252 Old myocardial infarction: Secondary | ICD-10-CM

## 2018-07-04 DIAGNOSIS — I749 Embolism and thrombosis of unspecified artery: Secondary | ICD-10-CM

## 2018-07-04 DIAGNOSIS — I5042 Chronic combined systolic (congestive) and diastolic (congestive) heart failure: Secondary | ICD-10-CM

## 2018-07-04 DIAGNOSIS — I10 Essential (primary) hypertension: Secondary | ICD-10-CM | POA: Diagnosis not present

## 2018-07-04 DIAGNOSIS — G459 Transient cerebral ischemic attack, unspecified: Secondary | ICD-10-CM | POA: Diagnosis not present

## 2018-07-04 DIAGNOSIS — E782 Mixed hyperlipidemia: Secondary | ICD-10-CM

## 2018-07-04 DIAGNOSIS — Z8673 Personal history of transient ischemic attack (TIA), and cerebral infarction without residual deficits: Secondary | ICD-10-CM

## 2018-07-04 DIAGNOSIS — E1159 Type 2 diabetes mellitus with other circulatory complications: Secondary | ICD-10-CM | POA: Diagnosis not present

## 2018-07-04 DIAGNOSIS — I25118 Atherosclerotic heart disease of native coronary artery with other forms of angina pectoris: Secondary | ICD-10-CM | POA: Diagnosis not present

## 2018-07-04 MED ORDER — LISINOPRIL 5 MG PO TABS: 5.0000 mg | ORAL_TABLET | Freq: Every day | ORAL | 3 refills | Status: DC

## 2018-07-04 MED ORDER — EZETIMIBE 10 MG PO TABS
10.0000 mg | ORAL_TABLET | Freq: Every day | ORAL | 3 refills | Status: DC
Start: 2018-07-04 — End: 2020-10-14

## 2018-07-04 MED ORDER — ATORVASTATIN CALCIUM 80 MG PO TABS: 80.0000 mg | ORAL_TABLET | Freq: Every day | ORAL | 3 refills | Status: DC

## 2018-07-04 MED ORDER — METOPROLOL SUCCINATE ER 100 MG PO TB24: 100.0000 mg | ORAL_TABLET | Freq: Every day | ORAL | 3 refills | Status: DC

## 2018-07-04 MED ORDER — RIVAROXABAN 2.5 MG PO TABS: 2.5000 mg | ORAL_TABLET | Freq: Every day | ORAL | 3 refills | Status: DC

## 2018-07-04 MED ORDER — SPIRONOLACTONE 25 MG PO TABS: 12.5000 mg | ORAL_TABLET | Freq: Every day | ORAL | 3 refills | Status: DC

## 2018-07-04 MED ORDER — CLOPIDOGREL BISULFATE 75 MG PO TABS: 75.0000 mg | ORAL_TABLET | Freq: Every day | ORAL | 3 refills | Status: DC

## 2018-07-04 NOTE — Patient Instructions (Signed)
Medication Instructions:  Try metoprolol at 6 pm instead of the Am  If you need a refill on your cardiac medications before your next appointment, please call your pharmacy.    Lab work: No new labs needed   If you have labs (blood work) drawn today and your tests are completely normal, you will receive your results only by: Marland Kitchen MyChart Message (if you have MyChart) OR . A paper copy in the mail If you have any lab test that is abnormal or we need to change your treatment, we will call you to review the results.   Testing/Procedures: No new testing needed   Follow-Up: At [facility], you and your health needs are our priority.  As part of our continuing mission to provide you with exceptional heart care, we have created designated Provider Care Teams.  These Care Teams include your primary Cardiologist (physician) and Advanced Practice Providers (APPs -  Physician Assistants and Nurse Practitioners) who all work together to provide you with the care you need, when you need it.  . You will need a follow up appointment in 12 months .   Please call our office 2 months in advance to schedule this appointment.    . Providers on your designated Care Team:   . Nicolasa Ducking, NP . Eula Listen, PA-C . Marisue Ivan, PA-C  Any Other Special Instructions Will Be Listed Below (If Applicable).  For educational health videos Log in to : [email or web address] Or : [email or web address], password : [email or web address]

## 2018-07-04 NOTE — Telephone Encounter (Signed)
Pt states between 30-40 units. Please call back if need to.

## 2018-07-04 NOTE — Telephone Encounter (Signed)
Pt had a refill on Humalog only received 1pk of 5ct pens. Pt only has 2 pens left. takes 100-120 units per day. Will run out very fast. Pt normally receives a 90 day supply. Please call the patient as soon as possible

## 2018-07-04 NOTE — Telephone Encounter (Signed)
Call pt Is he talking about this lantus? That seems high for meal dosed insulin  Please call pt and get  More info Ensure NO hypoglycemic episodes He also needs f/u for a1c which is overdue

## 2018-07-04 NOTE — Telephone Encounter (Signed)
LMTCB so I can get clarification on how much Humalog he is taking with each meal.

## 2018-07-04 NOTE — Telephone Encounter (Signed)
LMTCB

## 2018-07-05 ENCOUNTER — Telehealth: Payer: Self-pay | Admitting: Cardiovascular Disease

## 2018-07-05 DIAGNOSIS — Z8673 Personal history of transient ischemic attack (TIA), and cerebral infarction without residual deficits: Secondary | ICD-10-CM

## 2018-07-05 MED ORDER — RIVAROXABAN 2.5 MG PO TABS: 2.5000 mg | ORAL_TABLET | Freq: Two times a day (BID) | ORAL | 3 refills | Status: DC

## 2018-07-05 NOTE — Telephone Encounter (Signed)
° °  1. Name of Medication: Xarelto   2. How are you currently taking this medication (dosage and times per day)? Previously 2.5 mg po BID  3. Are you having a reaction (difficulty breathing--STAT)? No   4. What is your medication issue? [facility] pharmacy calling to clarify med dose change   Refill was sent for xarelto 2.5 mg po q d and is different than previous dose

## 2018-07-05 NOTE — Telephone Encounter (Signed)
Clarified with Dr Mariah Milling that Xarelto should be 2.5 mg by mouth two times a day. Called pharmacy to clarify with them and new Rx sent to pharmacy.

## 2018-07-06 NOTE — Telephone Encounter (Signed)
Noted  

## 2018-07-18 DIAGNOSIS — Z794 Long term (current) use of insulin: Secondary | ICD-10-CM | POA: Diagnosis not present

## 2018-07-18 DIAGNOSIS — E119 Type 2 diabetes mellitus without complications: Secondary | ICD-10-CM | POA: Diagnosis not present

## 2018-08-05 DIAGNOSIS — E1165 Type 2 diabetes mellitus with hyperglycemia: Secondary | ICD-10-CM | POA: Diagnosis not present

## 2018-08-05 DIAGNOSIS — E1129 Type 2 diabetes mellitus with other diabetic kidney complication: Secondary | ICD-10-CM | POA: Diagnosis not present

## 2018-08-05 DIAGNOSIS — E1142 Type 2 diabetes mellitus with diabetic polyneuropathy: Secondary | ICD-10-CM | POA: Diagnosis not present

## 2018-08-05 DIAGNOSIS — E113293 Type 2 diabetes mellitus with mild nonproliferative diabetic retinopathy without macular edema, bilateral: Secondary | ICD-10-CM | POA: Diagnosis not present

## 2018-08-16 DIAGNOSIS — E119 Type 2 diabetes mellitus without complications: Secondary | ICD-10-CM | POA: Diagnosis not present

## 2018-08-16 DIAGNOSIS — Z794 Long term (current) use of insulin: Secondary | ICD-10-CM | POA: Diagnosis not present

## 2018-08-17 ENCOUNTER — Encounter: Payer: Self-pay | Admitting: Family

## 2018-09-04 ENCOUNTER — Encounter: Payer: Self-pay | Admitting: Family

## 2018-09-16 DIAGNOSIS — Z794 Long term (current) use of insulin: Secondary | ICD-10-CM | POA: Diagnosis not present

## 2018-09-16 DIAGNOSIS — E119 Type 2 diabetes mellitus without complications: Secondary | ICD-10-CM | POA: Diagnosis not present

## 2018-09-30 DIAGNOSIS — E1142 Type 2 diabetes mellitus with diabetic polyneuropathy: Secondary | ICD-10-CM | POA: Diagnosis not present

## 2018-10-07 DIAGNOSIS — E1142 Type 2 diabetes mellitus with diabetic polyneuropathy: Secondary | ICD-10-CM | POA: Diagnosis not present

## 2018-10-07 DIAGNOSIS — E1129 Type 2 diabetes mellitus with other diabetic kidney complication: Secondary | ICD-10-CM | POA: Diagnosis not present

## 2018-10-07 DIAGNOSIS — E1159 Type 2 diabetes mellitus with other circulatory complications: Secondary | ICD-10-CM | POA: Diagnosis not present

## 2018-10-07 DIAGNOSIS — E113293 Type 2 diabetes mellitus with mild nonproliferative diabetic retinopathy without macular edema, bilateral: Secondary | ICD-10-CM | POA: Diagnosis not present

## 2018-10-22 DIAGNOSIS — E119 Type 2 diabetes mellitus without complications: Secondary | ICD-10-CM | POA: Diagnosis not present

## 2018-10-22 DIAGNOSIS — Z794 Long term (current) use of insulin: Secondary | ICD-10-CM | POA: Diagnosis not present

## 2018-10-26 ENCOUNTER — Ambulatory Visit (INDEPENDENT_AMBULATORY_CARE_PROVIDER_SITE_OTHER): Payer: 59 | Admitting: Family

## 2018-10-26 ENCOUNTER — Encounter: Payer: Self-pay | Admitting: Family

## 2018-10-26 DIAGNOSIS — I509 Heart failure, unspecified: Secondary | ICD-10-CM

## 2018-10-26 DIAGNOSIS — Z125 Encounter for screening for malignant neoplasm of prostate: Secondary | ICD-10-CM

## 2018-10-26 DIAGNOSIS — E1159 Type 2 diabetes mellitus with other circulatory complications: Secondary | ICD-10-CM

## 2018-10-26 DIAGNOSIS — I10 Essential (primary) hypertension: Secondary | ICD-10-CM | POA: Diagnosis not present

## 2018-10-26 DIAGNOSIS — F3175 Bipolar disorder, in partial remission, most recent episode depressed: Secondary | ICD-10-CM | POA: Diagnosis not present

## 2018-10-26 DIAGNOSIS — E782 Mixed hyperlipidemia: Secondary | ICD-10-CM

## 2018-10-26 NOTE — Assessment & Plan Note (Signed)
At baseline. Will continue to follow.

## 2018-10-26 NOTE — Progress Notes (Signed)
I called LM for patient to call back to schedule labs.

## 2018-10-26 NOTE — Progress Notes (Signed)
This visit type was conducted due to national recommendations for restrictions regarding the COVID-19 pandemic (e.g. social distancing).  This format is felt to be most appropriate for this patient at this time.  All issues noted in this document were discussed and addressed.  No physical exam was performed (except for noted visual exam findings with Video Visits). Virtual Visit via Video Note  I connected with@  on 10/26/18 at  8:30 AM EDT by a video enabled telemedicine application and verified that I am speaking with the correct person using two identifiers.  Location patient: home Location provider:work  Persons participating in the virtual visit: patient, provider  I discussed the limitations of evaluation and management by telemedicine and the availability of in person appointments. The patient expressed understanding and agreed to proceed.   HPI:  Feels well, no concerns.   HTN- recently 130/70. Compliant with lisinopril   CHF- Exertional SOB is at baseline, for past couple of years. Unchanged.  For instance, will become SOB with stairs. Had cardiac rehab in the past and declines repeating again at this time.  Denies leg swelling, exertional chest pain or pressure, orthopnea, numbness or tingling radiating to left arm or jaw, palpitations, dizziness, frequent headaches, changes in vision.   seen by [facility] 07/2018; on entresto  CAD and h/ o TIA-  plavix and xarelto  Bipolar- is off all the medication. 'feels the best he felt in a year.' Enjoying work.  Sleeping well. No hallucinations, manic episodes.   No hi/si.  HLD- zetia and lipitor  DM- following with endocrine, last a1c 7.9,  last visit with [facility] 09/2018. On dexcom G6 sensor. Awaiting pump.  Lantus 20 units,  humalog SSI. One to two lows. Can feel these and CGM will start beeping.   No urine hesitancy, decreased urine   ROS: See pertinent positives and negatives per HPI.  Past Medical History:  Diagnosis Date  .  Asthma   . Bipolar disorder (HCC)   . CHF (congestive heart failure) (HCC)   . Chicken pox   . Clotting disorder (HCC)    lung  . Depression   . Diabetes mellitus without complication (HCC) 2003   Type 2  . Heart murmur    as child  . Hyperlipidemia   . Hypertension   . Myocardial infarction (HCC) 06/2015  . Retinopathy of both eyes 01/2016  . SOB (shortness of breath) 08/17/2016  . Stroke [facility])     Past Surgical History:  Procedure Laterality Date  . CORONARY ANGIOPLASTY WITH STENT PLACEMENT Left 06/2015   LADx1 stent  . WISDOM TOOTH EXTRACTION      Family History  Problem Relation Age of Onset  . Hypertension Mother   . Arthritis Mother   . Heart disease Father        3 heart attacks  . Diabetes Father   . Hypertension Father   . Hyperlipidemia Father   . Alcohol abuse Father   . Hypertension Brother   . Hyperlipidemia Brother   . Heart disease Maternal Uncle   . Heart disease Paternal Aunt   . Heart disease Maternal Grandfather   . Hyperlipidemia Maternal Grandfather   . Diabetes Maternal Grandfather   . Kidney disease Maternal Grandfather   . Heart disease Paternal Grandmother   . Kidney disease Paternal Grandmother   . Hypertension Maternal Grandmother   . Kidney disease Maternal Grandmother   . Arthritis Maternal Grandmother   . Kidney disease Paternal Grandfather   . Colon cancer Neg  Hx     SOCIAL HX: non smoker  Current Outpatient Medications:  .  atorvastatin (LIPITOR) 80 MG tablet, Take 1 tablet (80 mg total) by mouth daily. qam, Disp: 90 tablet, Rfl: 3 .  clopidogrel (PLAVIX) 75 MG tablet, Take 1 tablet (75 mg total) by mouth daily. qam, Disp: 90 tablet, Rfl: 3 .  ezetimibe (ZETIA) 10 MG tablet, Take 1 tablet (10 mg total) by mouth daily., Disp: 90 tablet, Rfl: 3 .  INS SYRINGE/NEEDLE 1CC/28G (B-D INSULIN SYRINGE 1CC/28G) 28G X 1/2" 1 ML MISC, Use daily for insulin administration, Disp: 100 each, Rfl: 11 .  insulin glargine (LANTUS) 100 UNIT/ML  injection, Inject 0.25 mLs (25 Units total) into the skin daily. Increase by 1 units/day until fasting BS are < 150 (Patient taking differently: Inject 20 Units into the skin daily. Increase by 1 units/day until fasting BS are < 150), Disp: 10 mL, Rfl: 11 .  insulin lispro (HUMALOG) 100 UNIT/ML KiwkPen, Inject 0.12 mLs (12 Units total) into the skin 3 (three) times daily with meals. (Patient taking differently: Inject 13 Units into the skin 3 (three) times daily with meals. ), Disp: 15 mL, Rfl: 4 .  Insulin Pen Needle 33G X 5 MM MISC, 1 application by Does not apply route 4 (four) times daily as needed., Disp: 100 each, Rfl: 3 .  lisinopril (PRINIVIL,ZESTRIL) 5 MG tablet, Take 1 tablet (5 mg total) by mouth daily. qam, Disp: 90 tablet, Rfl: 3 .  metoprolol succinate (TOPROL-XL) 100 MG 24 hr tablet, Take 1 tablet (100 mg total) by mouth daily. Take with or immediately following a meal. qam, Disp: 90 tablet, Rfl: 3 .  rivaroxaban (XARELTO) 2.5 MG TABS tablet, Take 1 tablet (2.5 mg total) by mouth 2 (two) times daily., Disp: 180 tablet, Rfl: 3 .  spironolactone (ALDACTONE) 25 MG tablet, Take 0.5 tablets (12.5 mg total) by mouth daily. qam, Disp: 45 tablet, Rfl: 3 .  nitroGLYCERIN (NITROSTAT) 0.3 MG SL tablet, Place 1 tablet (0.3 mg total) under the tongue every 5 (five) minutes as needed. (Patient not taking: Reported on 10/26/2018), Disp: 90 tablet, Rfl: 0  EXAM:  VITALS per patient if applicable:  GENERAL: alert, oriented, appears well and in no acute distress  HEENT: atraumatic, conjunttiva clear, no obvious abnormalities on inspection of external nose and ears  NECK: normal movements of the head and neck  LUNGS: on inspection no signs of respiratory distress, breathing rate appears normal, no obvious gross SOB, gasping or wheezing  CV: no obvious cyanosis  MS: moves all visible extremities without noticeable abnormality  PSYCH/NEURO: pleasant and cooperative, no obvious depression or  anxiety, speech and thought processing grossly intact  ASSESSMENT AND PLAN:  Discussed the following assessment and plan:  Hypertension, unspecified type - Plan: Comprehensive metabolic panel, TSH, VITAMIN D 25 Hydroxy (Vit-D Deficiency, Fractures), CBC with Differential/Platelet, Microalbumin / creatinine urine ratio  Heart failure, unspecified HF chronicity, unspecified heart failure type (HCC)  Type 2 diabetes mellitus with other circulatory complication, unspecified whether long term insulin use (HCC)  Bipolar disorder, in partial remission, most recent episode depressed (HCC)  Mixed hyperlipidemia - Plan: Lipid panel  Screening for prostate cancer - Plan: PSA  Problem List Items Addressed This Visit      Cardiovascular and Mediastinum   HTN (hypertension) - Primary    Controlled. Continue regimen      Relevant Orders   Comprehensive metabolic panel   TSH   VITAMIN D 25 Hydroxy (Vit-D Deficiency, Fractures)  CBC with Differential/Platelet   Microalbumin / creatinine urine ratio   Heart failure (HCC)    At baseline. Will continue to follow.       Type 2 diabetes mellitus with circulatory disorder (HCC)    Improved. Following with [facility]; will follow        Other   Bipolar disorder, in partial remission, most recent episode depressed (HCC)    Stable. Declines medication at this time. Patient will keep me abreast of any breakthrough symptoms.       Mixed hyperlipidemia   Relevant Orders   Lipid panel   Screening for prostate cancer    Ordered psa.       Relevant Orders   PSA        I discussed the assessment and treatment plan with the patient. The patient was provided an opportunity to ask questions and all were answered. The patient agreed with the plan and demonstrated an understanding of the instructions.   The patient was advised to call back or seek an in-person evaluation if the symptoms worsen or if the condition fails to improve as  anticipated.   [name], FNP

## 2018-10-26 NOTE — Assessment & Plan Note (Signed)
Stable. Declines medication at this time. Patient will keep me abreast of any breakthrough symptoms.

## 2018-10-26 NOTE — Assessment & Plan Note (Signed)
Controlled. Continue regimen 

## 2018-10-26 NOTE — Assessment & Plan Note (Signed)
Improved. Following with [facility]; will follow

## 2018-10-26 NOTE — Assessment & Plan Note (Signed)
Ordered psa

## 2018-10-28 NOTE — Progress Notes (Signed)
Labs scheduled  

## 2018-11-07 ENCOUNTER — Other Ambulatory Visit: Payer: 59

## 2018-11-22 DIAGNOSIS — E119 Type 2 diabetes mellitus without complications: Secondary | ICD-10-CM | POA: Diagnosis not present

## 2018-11-22 DIAGNOSIS — Z794 Long term (current) use of insulin: Secondary | ICD-10-CM | POA: Diagnosis not present

## 2018-12-09 DIAGNOSIS — E1165 Type 2 diabetes mellitus with hyperglycemia: Secondary | ICD-10-CM | POA: Diagnosis not present

## 2018-12-10 NOTE — Progress Notes (Signed)
Cardiology Office Note  Date:  12/13/2018   ID:  Jose Merritt, Jose Merritt 1975-08-11, MRN [id number]  PCP:  Allegra Grana, FNP   Chief Complaint  Patient presents with  . Other    Patient c/o SOB and swelling in ankles. Meds reviewed verbally with patient.    HPI:  Jose Merritt is a pleasant 43 year old gentleman with history of CAD, Stent to LAD 06/2015, initially presented with shoulder pain neck pain, shortness of breath/anginal equivalent DM II Bipolar/depression HTN Hyperlipidemia Obesity TIA x2 in 2018 Who presents for management of his coronary disease  In follow-up today he reports that he is doing much better He is changed his diet drastically Working for security in the hospital No regular exercise program  Lab work reviewed with him in detail He is working with endocrinology HBA1c 13.4 in march 2019 Reports HBA1C was 7.9 recently  Reports his palpitations has improved as diabetes numbers have improved Previously would have Fluttering when he goes to sleep, Am  Lasting for 30 seconds up to 1 minute  Typically when trying to go to sleep  Has insulin pump, glucose monitor  EKG personally reviewed by myself on todays visit Shows normal sinus rhythm with rate 85 bpm no significant ST or T wave changes, unable to exclude old anterior MI  Other past medical history reviewed Tia x2 November 2018 reports that he was Getting an ice cream, Started complaining of arm pain, people reported he was talking off topic, Left arm, left shoulder numb He went to the hospital, [facility] CT head ok MRI ok Recent carotid ultrasound with no significant disease  Earlier in 2018 also reported having similar symptoms Went to hospital in [place], results not available   left arm numb again No speech problem that time  Details not clear but reports that he was given a medication at the time that he had significant allergy to  Admitted 1/31-07/11/2015 for STEMI. 1 week  prior to presentation he had left shoulder pain   treated at urgent care for bursitis with a steroid injection.   In the few days prior to presentation,   EKG with ST elevation. Cath with occluded mid LAD treated with DES. No residual disease.   Echo with ejection fraction 20% with probable LV thrombus. Started on warfarin. Metoprolol succinate, atorvastatin and lisinopril also initiated.   08/29/15 Repeat Cardiac cath, stable , stents patent Echocardiogram with improved ejection fraction (40%) and resolution of LV apical thrombus. Lifevest stopped Warfarin stopped, but he continued to take   followed by endocrinologist, Dr Vincente Liberty with DM nutritionist  Bipolar-seen psychiatrist;  Previous echocardiogram March 2017, ejection fraction 40%, up from 19%   PMH:   has a past medical history of Asthma, Bipolar disorder (HCC), CHF (congestive heart failure) (HCC), Chicken pox, Clotting disorder (HCC), Depression, Diabetes mellitus without complication (HCC) (2003), Heart murmur, Hyperlipidemia, Hypertension, Myocardial infarction (HCC) (06/2015), Retinopathy of both eyes (01/2016), SOB (shortness of breath) (08/17/2016), and Stroke (HCC).  PSH:    Past Surgical History:  Procedure Laterality Date  . CORONARY ANGIOPLASTY WITH STENT PLACEMENT Left 06/2015   LADx1 stent  . WISDOM TOOTH EXTRACTION      Current Outpatient Medications  Medication Sig Dispense Refill  . atorvastatin (LIPITOR) 80 MG tablet Take 1 tablet (80 mg total) by mouth daily. qam 90 tablet 3  . clopidogrel (PLAVIX) 75 MG tablet Take 1 tablet (75 mg total) by mouth daily. qam 90 tablet 3  . ezetimibe (ZETIA) 10 MG tablet  Take 1 tablet (10 mg total) by mouth daily. 90 tablet 3  . INS SYRINGE/NEEDLE 1CC/28G (B-D INSULIN SYRINGE 1CC/28G) 28G X 1/2" 1 ML MISC Use daily for insulin administration 100 each 11  . insulin glargine (LANTUS) 100 UNIT/ML injection Inject 0.25 mLs (25 Units total) into the skin daily.  Increase by 1 units/day until fasting BS are < 150 (Patient taking differently: Inject 20 Units into the skin daily. Increase by 1 units/day until fasting BS are < 150) 10 mL 11  . insulin lispro (HUMALOG) 100 UNIT/ML KiwkPen Inject 0.12 mLs (12 Units total) into the skin 3 (three) times daily with meals. (Patient taking differently: Inject 13 Units into the skin 3 (three) times daily with meals. ) 15 mL 4  . Insulin Pen Needle 33G X 5 MM MISC 1 application by Does not apply route 4 (four) times daily as needed. 100 each 3  . lisinopril (PRINIVIL,ZESTRIL) 5 MG tablet Take 1 tablet (5 mg total) by mouth daily. qam 90 tablet 3  . metoprolol succinate (TOPROL-XL) 100 MG 24 hr tablet Take 1 tablet (100 mg total) by mouth daily. Take with or immediately following a meal. qam 90 tablet 3  . nitroGLYCERIN (NITROSTAT) 0.3 MG SL tablet Place 1 tablet (0.3 mg total) under the tongue every 5 (five) minutes as needed. 90 tablet 0  . rivaroxaban (XARELTO) 2.5 MG TABS tablet Take 1 tablet (2.5 mg total) by mouth 2 (two) times daily. (Patient taking differently: Take 2.5 mg by mouth daily. ) 180 tablet 3  . spironolactone (ALDACTONE) 25 MG tablet Take 0.5 tablets (12.5 mg total) by mouth daily. qam 45 tablet 3   No current facility-administered medications for this visit.      Allergies:   Metrizamide, Other, Sulfa antibiotics, and Contrast media [iodinated diagnostic agents]   Social History:  The patient  reports that he has never smoked. He has never used smokeless tobacco. He reports that he does not drink alcohol or use drugs.   Family History:   family history includes Alcohol abuse in his father; Arthritis in his maternal grandmother and mother; Diabetes in his father and maternal grandfather; Heart disease in his father, maternal grandfather, maternal uncle, paternal aunt, and paternal grandmother; Hyperlipidemia in his brother, father, and maternal grandfather; Hypertension in his brother, father,  maternal grandmother, and mother; Kidney disease in his maternal grandfather, maternal grandmother, paternal grandfather, and paternal grandmother.    Review of Systems: Review of Systems  Constitutional: Negative.   HENT: Negative.   Respiratory: Negative.   Cardiovascular: Negative.   Gastrointestinal: Negative.   Musculoskeletal: Negative.   Neurological: Negative.   Psychiatric/Behavioral: Negative.   All other systems reviewed and are negative.  PHYSICAL EXAM: VS:  BP 120/73 (BP Location: Left Arm, Patient Position: Sitting, Cuff Size: Normal)   Pulse 85   Ht 6' (1.829 m)   Wt 245 lb (111.1 kg)   BMI 33.23 kg/m  , BMI Body mass index is 33.23 kg/m. Constitutional:  oriented to person, place, and time. No distress.  HENT:  Head: Grossly normal Eyes:  no discharge. No scleral icterus.  Neck: No JVD, no carotid bruits  Cardiovascular: Regular rate and rhythm, no murmurs appreciated Pulmonary/Chest: Clear to auscultation bilaterally, no wheezes or rails Abdominal: Soft.  no distension.  no tenderness.  Musculoskeletal: Normal range of motion Neurological:  normal muscle tone. Coordination normal. No atrophy Skin: Skin warm and dry Psychiatric: normal affect, pleasant  Recent Labs: No results found for requested  labs within last 8760 hours.    Lipid Panel Lab Results  Component Value Date   CHOL 144 08/05/2017   HDL 31.20 (L) 08/05/2017   LDLCALC 83 08/05/2017   TRIG 147.0 08/05/2017      Wt Readings from Last 3 Encounters:  12/13/18 245 lb (111.1 kg)  07/04/18 215 lb (97.5 kg)  04/27/18 205 lb 2 oz (93 kg)      ASSESSMENT AND PLAN:  Coronary artery disease of native artery of native heart with stable angina pectoris ([place]) - Plan: EKG 12-Lead Tolerating Plavix and Xarelto 2.5 mg po BID Diabetes and cholesterol numbers improved Weight continues to run high, recommended weight loss  Mixed hyperlipidemia Continue Zetia and Lipitor Recommended weight loss  as it appears weight is trending upwards LDL goal less than 70  TIA He was evaluated in the hospital twice for TIA symptoms  Plavix and start Xarelto 2.5 twice daily No further episodes  Type 2 diabetes mellitus with other circulatory complication, unspecified whether long term insulin use ([place]) Working with endocrinology with improved numbers Hemoglobin A1c down more than 5 points Encouragement provided  Bipolar disorder, in partial remission, most recent episode depressed ([place]) Stable managed by primary care  Chronic combined systolic and diastolic CHF (congestive heart failure) ([place])  Ejection fraction 40% March 2017 Previously did not want to change his medications to [facility] on today's visit, recommended weight loss, dietary changes  Disposition:   F/U  12 months   Total encounter time more than 25 minutes  Greater than 50% was spent in counseling and coordination of care with the patient    Orders Placed This Encounter  Procedures  . EKG 12-Lead     Signed, Esmond Plants, M.D., Ph.D. 12/13/2018  [place], [place]

## 2018-12-13 ENCOUNTER — Ambulatory Visit: Payer: 59 | Admitting: Cardiovascular Disease

## 2018-12-13 ENCOUNTER — Other Ambulatory Visit: Payer: Self-pay

## 2018-12-13 ENCOUNTER — Ambulatory Visit: Payer: 59 | Admitting: Physician Assistant

## 2018-12-13 ENCOUNTER — Encounter: Payer: Self-pay | Admitting: Cardiovascular Disease

## 2018-12-13 VITALS — BP 120/73 | HR 85 | Ht 72.0 in | Wt 245.0 lb

## 2018-12-13 DIAGNOSIS — F3175 Bipolar disorder, in partial remission, most recent episode depressed: Secondary | ICD-10-CM

## 2018-12-13 DIAGNOSIS — E782 Mixed hyperlipidemia: Secondary | ICD-10-CM | POA: Diagnosis not present

## 2018-12-13 DIAGNOSIS — E1165 Type 2 diabetes mellitus with hyperglycemia: Secondary | ICD-10-CM | POA: Diagnosis not present

## 2018-12-13 DIAGNOSIS — I25118 Atherosclerotic heart disease of native coronary artery with other forms of angina pectoris: Secondary | ICD-10-CM | POA: Diagnosis not present

## 2018-12-13 DIAGNOSIS — G459 Transient cerebral ischemic attack, unspecified: Secondary | ICD-10-CM | POA: Diagnosis not present

## 2018-12-13 DIAGNOSIS — E1159 Type 2 diabetes mellitus with other circulatory complications: Secondary | ICD-10-CM | POA: Diagnosis not present

## 2018-12-13 DIAGNOSIS — I749 Embolism and thrombosis of unspecified artery: Secondary | ICD-10-CM | POA: Diagnosis not present

## 2018-12-13 DIAGNOSIS — I10 Essential (primary) hypertension: Secondary | ICD-10-CM

## 2018-12-13 DIAGNOSIS — I5042 Chronic combined systolic (congestive) and diastolic (congestive) heart failure: Secondary | ICD-10-CM | POA: Diagnosis not present

## 2018-12-13 NOTE — Patient Instructions (Signed)

## 2018-12-22 DIAGNOSIS — E119 Type 2 diabetes mellitus without complications: Secondary | ICD-10-CM | POA: Diagnosis not present

## 2018-12-22 DIAGNOSIS — Z794 Long term (current) use of insulin: Secondary | ICD-10-CM | POA: Diagnosis not present

## 2019-01-24 DIAGNOSIS — E119 Type 2 diabetes mellitus without complications: Secondary | ICD-10-CM | POA: Diagnosis not present

## 2019-01-24 DIAGNOSIS — Z794 Long term (current) use of insulin: Secondary | ICD-10-CM | POA: Diagnosis not present

## 2019-02-24 DIAGNOSIS — Z794 Long term (current) use of insulin: Secondary | ICD-10-CM | POA: Diagnosis not present

## 2019-02-24 DIAGNOSIS — E119 Type 2 diabetes mellitus without complications: Secondary | ICD-10-CM | POA: Diagnosis not present

## 2019-03-09 DIAGNOSIS — E1165 Type 2 diabetes mellitus with hyperglycemia: Secondary | ICD-10-CM | POA: Diagnosis not present

## 2019-03-15 DIAGNOSIS — E113493 Type 2 diabetes mellitus with severe nonproliferative diabetic retinopathy without macular edema, bilateral: Secondary | ICD-10-CM | POA: Diagnosis not present

## 2019-03-16 LAB — HM DIABETES EYE EXAM

## 2019-03-22 DIAGNOSIS — E1142 Type 2 diabetes mellitus with diabetic polyneuropathy: Secondary | ICD-10-CM | POA: Diagnosis not present

## 2019-03-22 DIAGNOSIS — E782 Mixed hyperlipidemia: Secondary | ICD-10-CM | POA: Diagnosis not present

## 2019-03-26 DIAGNOSIS — Z794 Long term (current) use of insulin: Secondary | ICD-10-CM | POA: Diagnosis not present

## 2019-03-26 DIAGNOSIS — E119 Type 2 diabetes mellitus without complications: Secondary | ICD-10-CM | POA: Diagnosis not present

## 2019-04-08 DIAGNOSIS — F10129 Alcohol abuse with intoxication, unspecified: Secondary | ICD-10-CM | POA: Diagnosis not present

## 2019-04-08 DIAGNOSIS — R112 Nausea with vomiting, unspecified: Secondary | ICD-10-CM | POA: Diagnosis not present

## 2019-04-08 DIAGNOSIS — F10920 Alcohol use, unspecified with intoxication, uncomplicated: Secondary | ICD-10-CM | POA: Diagnosis not present

## 2019-05-05 DIAGNOSIS — E119 Type 2 diabetes mellitus without complications: Secondary | ICD-10-CM | POA: Diagnosis not present

## 2019-05-05 DIAGNOSIS — Z794 Long term (current) use of insulin: Secondary | ICD-10-CM | POA: Diagnosis not present

## 2019-05-12 DIAGNOSIS — E113293 Type 2 diabetes mellitus with mild nonproliferative diabetic retinopathy without macular edema, bilateral: Secondary | ICD-10-CM | POA: Diagnosis not present

## 2019-05-12 DIAGNOSIS — E1159 Type 2 diabetes mellitus with other circulatory complications: Secondary | ICD-10-CM | POA: Diagnosis not present

## 2019-05-12 DIAGNOSIS — E782 Mixed hyperlipidemia: Secondary | ICD-10-CM | POA: Diagnosis not present

## 2019-05-12 DIAGNOSIS — E1142 Type 2 diabetes mellitus with diabetic polyneuropathy: Secondary | ICD-10-CM | POA: Diagnosis not present

## 2019-06-05 DIAGNOSIS — Z794 Long term (current) use of insulin: Secondary | ICD-10-CM | POA: Diagnosis not present

## 2019-06-05 DIAGNOSIS — E119 Type 2 diabetes mellitus without complications: Secondary | ICD-10-CM | POA: Diagnosis not present

## 2019-06-08 DIAGNOSIS — E1165 Type 2 diabetes mellitus with hyperglycemia: Secondary | ICD-10-CM | POA: Diagnosis not present

## 2019-07-06 DIAGNOSIS — Z794 Long term (current) use of insulin: Secondary | ICD-10-CM | POA: Diagnosis not present

## 2019-07-06 DIAGNOSIS — E119 Type 2 diabetes mellitus without complications: Secondary | ICD-10-CM | POA: Diagnosis not present

## 2019-08-01 ENCOUNTER — Other Ambulatory Visit: Payer: Self-pay | Admitting: Cardiovascular Disease

## 2019-08-01 DIAGNOSIS — I10 Essential (primary) hypertension: Secondary | ICD-10-CM

## 2019-08-08 DIAGNOSIS — E1142 Type 2 diabetes mellitus with diabetic polyneuropathy: Secondary | ICD-10-CM | POA: Diagnosis not present

## 2019-08-18 DIAGNOSIS — E1142 Type 2 diabetes mellitus with diabetic polyneuropathy: Secondary | ICD-10-CM | POA: Diagnosis not present

## 2019-08-18 DIAGNOSIS — E782 Mixed hyperlipidemia: Secondary | ICD-10-CM | POA: Diagnosis not present

## 2019-08-18 DIAGNOSIS — E1159 Type 2 diabetes mellitus with other circulatory complications: Secondary | ICD-10-CM | POA: Diagnosis not present

## 2019-08-18 DIAGNOSIS — E113293 Type 2 diabetes mellitus with mild nonproliferative diabetic retinopathy without macular edema, bilateral: Secondary | ICD-10-CM | POA: Diagnosis not present

## 2019-09-02 NOTE — Progress Notes (Addendum)
Cardiology Office Note  Date:  09/04/2019   ID:  Jose Merritt, Jose Merritt 1975/08/16, MRN [id number]  PCP:  Jose Grana, FNP   Chief Complaint  Patient presents with  . office visit    12 month F/U-No new concerns; Meds verbally reviewed with patient.    HPI:  Jose Merritt is a pleasant 44 year old gentleman with history of CAD, STEMI Stent to LAD 06/2015, initially presented with shoulder pain neck pain, shortness of breath/anginal equivalent DM II Bipolar/depression HTN Hyperlipidemia Obesity TIA x2 in 2018 Hx of PE Initial ejection fraction down to 20% in early 2017 after STEMI, Treated with warfarin for LV thrombus 6 months EF Up to 40% on follow-up echo 2017 Who presents for management of his coronary disease  In follow-up today reports that he is doing well Non smoker  Lab work reviewed HBA1C 6.2, marked improvement Used to be 13 Total 93 LDL 45  No palpitations, seem to improve as his diabetes numbers improved No angina, no shortness of breath on exertion No regular exercise program  EKG personally reviewed by myself on todays visit Shows normal sinus rhythm with rate 82 bpm no significant ST or T wave changes, unable to exclude old anterior MI  Other past medical history reviewed Tia x2 November 2018 reports that he was Getting an ice cream, Started complaining of arm pain, people reported he was talking off topic, Left arm, left shoulder numb He went to the hospital, [facility] CT head ok MRI ok Recent carotid ultrasound with no significant disease  Earlier in 2018 also reported having similar symptoms Went to hospital in [place], results not available   left arm numb again No speech problem that time  Details not clear but reports that he was given a medication at the time that he had significant allergy to  Admitted 1/31-07/11/2015 for STEMI. 1 week prior to presentation he had left shoulder pain   treated at urgent care for bursitis with  a steroid injection.   In the few days prior to presentation,   EKG with ST elevation. Cath with occluded mid LAD treated with DES. No residual disease.   Echo with ejection fraction 20% with probable LV thrombus. Started on warfarin. Metoprolol succinate, atorvastatin and lisinopril also initiated.   08/29/15 Repeat Cardiac cath, stable , stents patent Echocardiogram with improved ejection fraction (40%) and resolution of LV apical thrombus. Lifevest stopped Warfarin stopped, but he continued to take   followed by endocrinologist, Dr Vincente Liberty with DM nutritionist  Bipolar-seen psychiatrist;  Previous echocardiogram March 2017, ejection fraction 40%, up from 19%   PMH:   has a past medical history of Asthma, Bipolar disorder (HCC), CHF (congestive heart failure) (HCC), Chicken pox, Clotting disorder (HCC), Depression, Diabetes mellitus without complication (HCC) (2003), Heart murmur, Hyperlipidemia, Hypertension, Myocardial infarction (HCC) (06/2015), Retinopathy of both eyes (01/2016), SOB (shortness of breath) (08/17/2016), and Stroke (HCC).  PSH:    Past Surgical History:  Procedure Laterality Date  . CORONARY ANGIOPLASTY WITH STENT PLACEMENT Left 06/2015   LADx1 stent  . WISDOM TOOTH EXTRACTION      Current Outpatient Medications  Medication Sig Dispense Refill  . atorvastatin (LIPITOR) 80 MG tablet Take 1 tablet (80 mg total) by mouth daily. qam 90 tablet 3  . clopidogrel (PLAVIX) 75 MG tablet Take 1 tablet (75 mg total) by mouth daily. qam 90 tablet 3  . ezetimibe (ZETIA) 10 MG tablet Take 1 tablet (10 mg total) by mouth daily. 90 tablet 3  .  gabapentin (NEURONTIN) 300 MG capsule Take 400 mg by mouth at bedtime.    . INS SYRINGE/NEEDLE 1CC/28G (B-D INSULIN SYRINGE 1CC/28G) 28G X 1/2" 1 ML MISC Use daily for insulin administration 100 each 11  . insulin lispro (HUMALOG) 100 UNIT/ML KiwkPen Inject 0.12 mLs (12 Units total) into the skin 3 (three) times daily with  meals. (Patient taking differently: Inject 13 Units into the skin 3 (three) times daily with meals. ) 15 mL 4  . Insulin Pen Needle 33G X 5 MM MISC 1 application by Does not apply route 4 (four) times daily as needed. 100 each 3  . lisinopril (PRINIVIL,ZESTRIL) 5 MG tablet Take 1 tablet (5 mg total) by mouth daily. qam 90 tablet 3  . metoprolol succinate (TOPROL-XL) 100 MG 24 hr tablet Take 1 tablet (100 mg total) by mouth daily. Take with or immediately following a meal. qam 90 tablet 3  . nitroGLYCERIN (NITROSTAT) 0.3 MG SL tablet Place 1 tablet (0.3 mg total) under the tongue every 5 (five) minutes as needed. 90 tablet 0  . rivaroxaban (XARELTO) 2.5 MG TABS tablet Take 1 tablet (2.5 mg total) by mouth 2 (two) times daily. (Patient taking differently: Take 2.5 mg by mouth daily. ) 180 tablet 3  . spironolactone (ALDACTONE) 25 MG tablet TAKE 1/2 TABLETS (12.5 MG) BY MOUTH DAILY EVERY MORNING 45 tablet 0  . insulin glargine (LANTUS) 100 UNIT/ML injection Inject 0.25 mLs (25 Units total) into the skin daily. Increase by 1 units/day until fasting BS are < 150 (Patient taking differently: Inject 20 Units into the skin daily. Increase by 1 units/day until fasting BS are < 150) 10 mL 11   No current facility-administered medications for this visit.     Allergies:   Metrizamide, Other, Sulfa antibiotics, and Contrast media [iodinated diagnostic agents]   Social History:  The patient  reports that he has never smoked. He has never used smokeless tobacco. He reports that he does not drink alcohol or use drugs.   Family History:   family history includes Alcohol abuse in his father; Arthritis in his maternal grandmother and mother; Diabetes in his father and maternal grandfather; Heart disease in his father, maternal grandfather, maternal uncle, paternal aunt, and paternal grandmother; Hyperlipidemia in his brother, father, and maternal grandfather; Hypertension in his brother, father, maternal grandmother,  and mother; Kidney disease in his maternal grandfather, maternal grandmother, paternal grandfather, and paternal grandmother.    Review of Systems: Review of Systems  Constitutional: Negative.   HENT: Negative.   Respiratory: Negative.   Cardiovascular: Negative.   Gastrointestinal: Negative.   Musculoskeletal: Negative.   Neurological: Negative.   Psychiatric/Behavioral: Negative.   All other systems reviewed and are negative.  PHYSICAL EXAM: VS:  BP 109/66 (BP Location: Left Arm, Patient Position: Sitting, Cuff Size: Normal)   Pulse 82   Ht 6' (1.829 m)   Wt 241 lb (109.3 kg)   SpO2 97%   BMI 32.69 kg/m  , BMI Body mass index is 32.69 kg/m. Constitutional:  oriented to person, place, and time. No distress.  HENT:  Head: Grossly normal Eyes:  no discharge. No scleral icterus.  Neck: No JVD, no carotid bruits  Cardiovascular: Regular rate and rhythm, no murmurs appreciated Pulmonary/Chest: Clear to auscultation bilaterally, no wheezes or rails Abdominal: Soft.  no distension.  no tenderness.  Musculoskeletal: Normal range of motion Neurological:  normal muscle tone. Coordination normal. No atrophy Skin: Skin warm and dry Psychiatric: normal affect, pleasant   Recent  Labs: No results found for requested labs within last 8760 hours.    Lipid Panel Lab Results  Component Value Date   CHOL 144 08/05/2017   HDL 31.20 (L) 08/05/2017   LDLCALC 83 08/05/2017   TRIG 147.0 08/05/2017      Wt Readings from Last 3 Encounters:  09/04/19 241 lb (109.3 kg)  12/13/18 245 lb (111.1 kg)  07/04/18 215 lb (97.5 kg)      ASSESSMENT AND PLAN:  Coronary artery disease of native artery of native heart with stable angina pectoris ([place]) - Plan: EKG 12-Lead Tolerating Plavix and Xarelto 2.5 mg po BID Diabetes and cholesterol numbers dramatically improved Weight high but stable 240  Mixed hyperlipidemia Continue Zetia and Lipitor Cholesterol at goal  TIA He was evaluated in  the hospital twice for TIA symptoms  Plavix and start Xarelto 2.5 twice daily No further episodes  Type 2 diabetes mellitus with other circulatory complication, unspecified whether long term insulin use (HCC) Dramatic improvement in A1c Fantastic numbers  Bipolar disorder, in partial remission, most recent episode depressed ([place]) Stable managed by primary care  Chronic combined systolic and diastolic CHF (congestive heart failure) ([place])  Ejection fraction 40% March 2017 Previously discussed changing to [facility], declined We have offered echocardiogram if he chooses to reevaluate ejection fraction, prefers to hold off at this time Appears relatively euvolemic, no changes made  Disposition:   F/U  12 months   Total encounter time more than 25 minutes  Greater than 50% was spent in counseling and coordination of care with the patient    Orders Placed This Encounter  Procedures  . EKG 12-Lead     Signed, Esmond Plants, M.D., Ph.D. 09/04/2019  [place], [place]

## 2019-09-04 ENCOUNTER — Other Ambulatory Visit: Payer: Self-pay

## 2019-09-04 ENCOUNTER — Encounter: Payer: Self-pay | Admitting: Cardiovascular Disease

## 2019-09-04 ENCOUNTER — Ambulatory Visit (INDEPENDENT_AMBULATORY_CARE_PROVIDER_SITE_OTHER): Payer: 59 | Admitting: Cardiovascular Disease

## 2019-09-04 VITALS — BP 109/66 | HR 82 | Ht 72.0 in | Wt 241.0 lb

## 2019-09-04 DIAGNOSIS — I10 Essential (primary) hypertension: Secondary | ICD-10-CM

## 2019-09-04 DIAGNOSIS — E1159 Type 2 diabetes mellitus with other circulatory complications: Secondary | ICD-10-CM

## 2019-09-04 DIAGNOSIS — Z8673 Personal history of transient ischemic attack (TIA), and cerebral infarction without residual deficits: Secondary | ICD-10-CM | POA: Diagnosis not present

## 2019-09-04 DIAGNOSIS — I25118 Atherosclerotic heart disease of native coronary artery with other forms of angina pectoris: Secondary | ICD-10-CM

## 2019-09-04 DIAGNOSIS — E782 Mixed hyperlipidemia: Secondary | ICD-10-CM

## 2019-09-04 DIAGNOSIS — I5042 Chronic combined systolic (congestive) and diastolic (congestive) heart failure: Secondary | ICD-10-CM

## 2019-09-04 NOTE — Patient Instructions (Signed)

## 2019-09-07 DIAGNOSIS — E1165 Type 2 diabetes mellitus with hyperglycemia: Secondary | ICD-10-CM | POA: Diagnosis not present

## 2019-12-07 DIAGNOSIS — E113493 Type 2 diabetes mellitus with severe nonproliferative diabetic retinopathy without macular edema, bilateral: Secondary | ICD-10-CM | POA: Diagnosis not present

## 2019-12-07 LAB — HM DIABETES EYE EXAM

## 2019-12-15 ENCOUNTER — Encounter: Payer: Self-pay | Admitting: Family

## 2019-12-16 DIAGNOSIS — E1165 Type 2 diabetes mellitus with hyperglycemia: Secondary | ICD-10-CM | POA: Diagnosis not present

## 2020-01-05 ENCOUNTER — Other Ambulatory Visit: Payer: Self-pay | Admitting: Cardiovascular Disease

## 2020-01-05 DIAGNOSIS — I10 Essential (primary) hypertension: Secondary | ICD-10-CM

## 2020-01-05 DIAGNOSIS — Z8673 Personal history of transient ischemic attack (TIA), and cerebral infarction without residual deficits: Secondary | ICD-10-CM

## 2020-01-05 NOTE — Telephone Encounter (Signed)
Please review for Xarelto refill, Thanks!

## 2020-01-08 ENCOUNTER — Other Ambulatory Visit: Payer: Self-pay | Admitting: Cardiovascular Disease

## 2020-01-08 NOTE — Telephone Encounter (Signed)
Pt's age 44, wt 109.3 kg, SCr 0.8, CrCl 178.37, last ov w/ TG 09/04/19.

## 2020-03-15 DIAGNOSIS — E1165 Type 2 diabetes mellitus with hyperglycemia: Secondary | ICD-10-CM | POA: Diagnosis not present

## 2020-05-20 ENCOUNTER — Encounter: Payer: Self-pay | Admitting: Family

## 2020-05-21 ENCOUNTER — Encounter: Payer: Self-pay | Admitting: Emergency Medicine

## 2020-05-21 ENCOUNTER — Other Ambulatory Visit: Payer: Self-pay

## 2020-05-21 ENCOUNTER — Ambulatory Visit
Admission: EM | Admit: 2020-05-21 | Discharge: 2020-05-21 | Disposition: A | Discharge status: Home / Self Care | Payer: 59 | Attending: Family Medicine | Admitting: Family Medicine

## 2020-05-21 DIAGNOSIS — R109 Unspecified abdominal pain: Secondary | ICD-10-CM | POA: Insufficient documentation

## 2020-05-21 DIAGNOSIS — Z7689 Persons encountering health services in other specified circumstances: Secondary | ICD-10-CM | POA: Diagnosis not present

## 2020-05-21 DIAGNOSIS — J029 Acute pharyngitis, unspecified: Secondary | ICD-10-CM | POA: Insufficient documentation

## 2020-05-21 LAB — POCT URINALYSIS DIP (MANUAL ENTRY)
Bilirubin, UA: NEGATIVE
Glucose, UA: 250 mg/dL — AB
Leukocytes, UA: NEGATIVE
Nitrite, UA: NEGATIVE
Protein Ur, POC: NEGATIVE mg/dL
Spec Grav, UA: <=1.005 — AB (ref 1.010–1.025)
Urobilinogen, UA: 0.2 E.U./dL
pH, UA: 5 (ref 5.0–8.0)

## 2020-05-21 LAB — POCT RAPID STREP A (OFFICE): Rapid Strep A Screen: NEGATIVE

## 2020-05-21 NOTE — ED Provider Notes (Signed)
Jose Merritt    CSN: [id number] Arrival date & time: 05/21/20  1052      History   Chief Complaint Chief Complaint  Patient presents with  . Sore Throat  . Flank Pain    HPI Jose Merritt is a 44 y.o. male.   Patient is a 44 year old male with past medical history of asthma, bipolar, CHF, chickenpox, clotting disorder, depression, diabetes, hyperlipidemia, hypertension, MI, retinopathy, stroke.  Jose Merritt complains today with sore throat for 3 days.  Initially painful with swallowing but that has subsided.  Taking Tylenol without much relief.  Has had some generalized body aches.  No cough, chest congestion, sinus congestion, ear pain.  Also having left flank pain which radiates to the right at times.  Denies any dysuria, hematuria or urinary frequency.  No fevers.  No nausea or vomiting.  History of kidney stones approximately 15 years ago.  Reporting his blood sugars have been mostly under control.      Past Medical History:  Diagnosis Date  . Asthma   . Bipolar disorder (HCC)   . CHF (congestive heart failure) (HCC)   . Chicken pox   . Clotting disorder (HCC)    lung  . Depression   . Diabetes mellitus without complication (HCC) 2003   Type 2  . Heart murmur    as child  . Hyperlipidemia   . Hypertension   . Myocardial infarction (HCC) 06/2015  . Retinopathy of both eyes 01/2016  . SOB (shortness of breath) 08/17/2016  . Stroke [facility])     Patient Active Problem List   Diagnosis Date Noted  . Screening for prostate cancer 04/27/2018  . TIA due to embolism (HCC) 10/07/2017  . Neck pain 08/04/2017  . Aphasia 04/30/2017  . Coronary artery disease of native artery of native heart with stable angina pectoris (HCC) 02/07/2017  . Type 2 diabetes mellitus with circulatory disorder (HCC) 02/07/2017  . Chronic combined systolic and diastolic CHF (congestive heart failure) (HCC) 02/07/2017  . Heart failure (HCC) 11/18/2016  . Bipolar disorder, in partial  remission, most recent episode depressed (HCC) 09/30/2016  . HTN (hypertension) 09/30/2016  . Mixed hyperlipidemia 09/30/2016  . History of CVA (cerebrovascular accident) 09/30/2016  . History of MI (myocardial infarction) 09/30/2016    Past Surgical History:  Procedure Laterality Date  . CORONARY ANGIOPLASTY WITH STENT PLACEMENT Left 06/2015   LADx1 stent  . WISDOM TOOTH EXTRACTION         Home Medications    Prior to Admission medications   Medication Sig Start Date End Date Taking? Authorizing Provider  atorvastatin (LIPITOR) 80 MG tablet Take 1 tablet (80 mg total) by mouth daily. qam 07/04/18  Yes Gollan, Tollie Pizza, MD  clopidogrel (PLAVIX) 75 MG tablet TAKE 1 TABLET BY MOUTH DAILY EVERY MORNING 01/05/20  Yes Gollan, Tollie Pizza, MD  ezetimibe (ZETIA) 10 MG tablet Take 1 tablet (10 mg total) by mouth daily. 07/04/18  Yes Antonieta Iba, MD  gabapentin (NEURONTIN) 300 MG capsule Take 400 mg by mouth at bedtime. 08/29/19  Yes [provider]  INS SYRINGE/NEEDLE 1CC/28G (B-D INSULIN SYRINGE 1CC/28G) 28G X 1/2" 1 ML MISC Use daily for insulin administration 09/06/17  Yes Arnett, Lyn Records, FNP  insulin glargine (LANTUS) 100 UNIT/ML injection Inject 0.25 mLs (25 Units total) into the skin daily. Increase by 1 units/day until fasting BS are < 150 Patient taking differently: Inject 20 Units into the skin daily. Increase by 1 units/day until fasting BS  are < 150 08/06/17  Yes Arnett, Lyn Records, FNP  insulin lispro (HUMALOG) 100 UNIT/ML KiwkPen Inject 0.12 mLs (12 Units total) into the skin 3 (three) times daily with meals. Patient taking differently: Inject 13 Units into the skin 3 (three) times daily with meals. 08/06/17  Yes Arnett, Lyn Records, FNP  Insulin Pen Needle 33G X 5 MM MISC 1 application by Does not apply route 4 (four) times daily as needed. 04/01/17  Yes Arnett, Lyn Records, FNP  lisinopril (PRINIVIL,ZESTRIL) 5 MG tablet Take 1 tablet (5 mg total) by mouth daily. qam 07/04/18   Yes Gollan, Tollie Pizza, MD  metoprolol succinate (TOPROL-XL) 100 MG 24 hr tablet Take 1 tablet (100 mg total) by mouth daily. Take with or immediately following a meal. qam 07/04/18  Yes Gollan, Tollie Pizza, MD  spironolactone (ALDACTONE) 25 MG tablet TAKE 1/2 TABLETS (12.5 MG) BY MOUTH DAILY EVERY MORNING 01/05/20  Yes Gollan, Tollie Pizza, MD  XARELTO 2.5 MG TABS tablet TAKE 1 TABLET BY MOUTH TWICE DAILY 01/08/20  Yes Gollan, Tollie Pizza, MD  nitroGLYCERIN (NITROSTAT) 0.3 MG SL tablet Place 1 tablet (0.3 mg total) under the tongue every 5 (five) minutes as needed. 11/18/16   Allegra Grana, FNP    Family History Family History  Problem Relation Age of Onset  . Hypertension Mother   . Arthritis Mother   . Heart disease Father        3 heart attacks  . Diabetes Father   . Hypertension Father   . Hyperlipidemia Father   . Alcohol abuse Father   . Hypertension Brother   . Hyperlipidemia Brother   . Heart disease Maternal Uncle   . Heart disease Paternal Aunt   . Heart disease Maternal Grandfather   . Hyperlipidemia Maternal Grandfather   . Diabetes Maternal Grandfather   . Kidney disease Maternal Grandfather   . Heart disease Paternal Grandmother   . Kidney disease Paternal Grandmother   . Hypertension Maternal Grandmother   . Kidney disease Maternal Grandmother   . Arthritis Maternal Grandmother   . Kidney disease Paternal Grandfather   . Colon cancer Neg Hx     Social History Social History   Tobacco Use  . Smoking status: Never Smoker  . Smokeless tobacco: Never Used  Vaping Use  . Vaping Use: Never used  Substance Use Topics  . Alcohol use: No    Comment: rare- 2x/year  . Drug use: No     Allergies   Metrizamide, Other, Sulfa antibiotics, and Contrast media [iodinated diagnostic agents]   Review of Systems Review of Systems   Physical Exam Triage Vital Signs ED Triage Vitals  Enc Vitals Group     BP 05/21/20 1127 (!) 148/93     Pulse Rate 05/21/20 1127 83      Resp 05/21/20 1127 15     Temp 05/21/20 1127 98 F (36.7 C)     Temp Source 05/21/20 1127 Oral     SpO2 05/21/20 1127 96 %     Weight --      Height --      Head Circumference --      Peak Flow --      Pain Score 05/21/20 1124 5     Pain Loc --      Pain Edu? --      Excl. in GC? --    No data found.  Updated Vital Signs BP (!) 148/93 (BP Location: Left Arm)   Pulse 83  Temp 98 F (36.7 C) (Oral)   Resp 15   SpO2 96%   Visual Acuity Right Eye Distance:   Left Eye Distance:   Bilateral Distance:    Right Eye Near:   Left Eye Near:    Bilateral Near:     Physical Exam Vitals and nursing note reviewed.  Constitutional:      General: Jose Merritt is not in acute distress.    Appearance: Normal appearance. Jose Merritt is not ill-appearing, toxic-appearing or diaphoretic.  HENT:     Head: Normocephalic and atraumatic.     Right Ear: Tympanic membrane and ear canal normal.     Left Ear: Tympanic membrane and ear canal normal.     Nose: Nose normal.     Mouth/Throat:     Pharynx: Oropharynx is clear.  Eyes:     Conjunctiva/sclera: Conjunctivae normal.  Cardiovascular:     Rate and Rhythm: Normal rate and regular rhythm.  Pulmonary:     Effort: Pulmonary effort is normal.     Breath sounds: Normal breath sounds.  Abdominal:     Tenderness: There is no right CVA tenderness or left CVA tenderness.  Musculoskeletal:        General: Normal range of motion.     Cervical back: Normal range of motion.  Skin:    General: Skin is warm and dry.  Neurological:     Mental Status: Jose Merritt is alert.  Psychiatric:        Mood and Affect: Mood normal.      UC Treatments / Results  Labs (all labs ordered are listed, but only abnormal results are displayed) Labs Reviewed  POCT URINALYSIS DIP (MANUAL ENTRY) - Abnormal; Notable for the following components:      Result Value   Color, UA light yellow (*)    Glucose, UA =250 (*)    Ketones, POC UA small (15) (*)    Spec Grav, UA <=1.005 (*)     Blood, UA small (*)    All other components within normal limits  CULTURE, GROUP A STREP ([facility])  URINE CULTURE  NOVEL CORONAVIRUS, NAA  CBC WITH DIFFERENTIAL/PLATELET  COMPREHENSIVE METABOLIC PANEL  POCT RAPID STREP A (OFFICE)    EKG   Radiology No results found.  Procedures Procedures (including critical care time)  Medications Ordered in UC Medications - No data to display  Initial Impression / Assessment and Plan / UC Course  I have reviewed the triage vital signs and the nursing notes.  Pertinent labs & imaging results that were available during my care of the patient were reviewed by me and considered in my medical decision making (see chart for details).     Flank pain No CVA tenderness on exam. Urine with small blood, small ketones, glucose.  Will send for culture. Checking CBC and CMP. Recommend drink plenty of water. Could be body aches from something viral.  Sore throat Strep test negative.  Sending for culture.  Final Clinical Impressions(s) / UC Diagnoses   Final diagnoses:  Flank pain  Sore throat     Discharge Instructions     Strep test is negative. Sending for culture Urine with some blood. Sending for culture. Could be kidney stone versus something viral Drink plenty of water. Covid testing sent.  I am also checking some lab work.  Follow up as needed for continued or worsening symptoms     ED Prescriptions    None     PDMP not reviewed this encounter.  Janace Aris, NP 05/21/20 1201

## 2020-05-21 NOTE — Discharge Instructions (Addendum)
Strep test is negative. Sending for culture Urine with some blood. Sending for culture. Could be kidney stone versus something viral Drink plenty of water. Covid testing sent.  I am also checking some lab work.  Follow up as needed for continued or worsening symptoms

## 2020-05-21 NOTE — ED Triage Notes (Addendum)
Patient c/o sore throat x 3 days.   Patient denies fever.   Patient endorses painful swallowing upon onset of symptoms.   Patient has taken Tylenol with no relief of symptoms.    Patient c/o LFT sided flank pain.   Patient denies dysuria or burning with urination.   Patient has a history of kidney stones.   History of DM.

## 2020-05-22 ENCOUNTER — Encounter: Payer: Self-pay | Admitting: Family

## 2020-05-22 LAB — COMPREHENSIVE METABOLIC PANEL
ALT: 21 IU/L (ref 0–44)
AST: 14 IU/L (ref 0–40)
Albumin/Globulin Ratio: 1.5 (ref 1.2–2.2)
Albumin: 4.4 g/dL (ref 4.0–5.0)
Alkaline Phosphatase: 129 IU/L — ABNORMAL HIGH (ref 44–121)
BUN/Creatinine Ratio: 24 — ABNORMAL HIGH (ref 9–20)
BUN: 19 mg/dL (ref 6–24)
Bilirubin Total: 0.4 mg/dL (ref 0.0–1.2)
CO2: 20 mmol/L (ref 20–29)
Calcium: 9.5 mg/dL (ref 8.7–10.2)
Chloride: 96 mmol/L (ref 96–106)
Creatinine, Ser: 0.8 mg/dL (ref 0.76–1.27)
GFR calc Af Amer: 124 mL/min/{1.73_m2} (ref >59)
GFR calc non Af Amer: 107 mL/min/{1.73_m2} (ref >59)
Globulin, Total: 2.9 g/dL (ref 1.5–4.5)
Glucose: 267 mg/dL — ABNORMAL HIGH (ref 65–99)
Potassium: 4.3 mmol/L (ref 3.5–5.2)
Sodium: 133 mmol/L — ABNORMAL LOW (ref 134–144)
Total Protein: 7.3 g/dL (ref 6.0–8.5)

## 2020-05-22 LAB — CBC WITH DIFFERENTIAL/PLATELET
Basophils Absolute: 0.1 10*3/uL (ref 0.0–0.2)
Basos: 1 %
EOS (ABSOLUTE): 0.2 10*3/uL (ref 0.0–0.4)
Eos: 4 %
Hematocrit: 44.3 % (ref 37.5–51.0)
Hemoglobin: 15 g/dL (ref 13.0–17.7)
Immature Grans (Abs): 0 10*3/uL (ref 0.0–0.1)
Immature Granulocytes: 1 %
Lymphocytes Absolute: 2.1 10*3/uL (ref 0.7–3.1)
Lymphs: 34 %
MCH: 29.8 pg (ref 26.6–33.0)
MCHC: 33.9 g/dL (ref 31.5–35.7)
MCV: 88 fL (ref 79–97)
Monocytes Absolute: 0.9 10*3/uL (ref 0.1–0.9)
Monocytes: 14 %
Neutrophils Absolute: 3 10*3/uL (ref 1.4–7.0)
Neutrophils: 46 %
Platelets: 210 10*3/uL (ref 150–450)
RBC: 5.04 x10E6/uL (ref 4.14–5.80)
RDW: 12.9 % (ref 11.6–15.4)
WBC: 6.3 10*3/uL (ref 3.4–10.8)

## 2020-05-22 LAB — URINE CULTURE: Culture: NO GROWTH

## 2020-05-22 NOTE — Telephone Encounter (Signed)
I spoke with patient & advised that his BUN was elevated usually due to dehydration, but that creatinine was normal. He did not need further labs in regards to this. I also scheduled for f/u.

## 2020-05-23 ENCOUNTER — Other Ambulatory Visit: Payer: Self-pay | Admitting: Cardiovascular Disease

## 2020-05-23 DIAGNOSIS — I10 Essential (primary) hypertension: Secondary | ICD-10-CM

## 2020-05-23 LAB — NOVEL CORONAVIRUS, NAA: SARS-CoV-2, NAA: NOT DETECTED

## 2020-05-23 LAB — SARS-COV-2, NAA 2 DAY TAT

## 2020-05-24 LAB — CULTURE, GROUP A STREP (THRC)

## 2020-06-17 DIAGNOSIS — E1165 Type 2 diabetes mellitus with hyperglycemia: Secondary | ICD-10-CM | POA: Diagnosis not present

## 2020-07-03 ENCOUNTER — Ambulatory Visit: Payer: 59 | Admitting: Family

## 2020-09-02 DIAGNOSIS — E1165 Type 2 diabetes mellitus with hyperglycemia: Secondary | ICD-10-CM | POA: Diagnosis not present

## 2020-09-06 ENCOUNTER — Emergency Department
Admission: EM | Admit: 2020-09-06 | Discharge: 2020-09-06 | Disposition: A | Discharge status: Home / Self Care | Payer: PRIVATE HEALTH INSURANCE | Attending: Emergency Medicine | Admitting: Emergency Medicine

## 2020-09-06 ENCOUNTER — Encounter: Payer: Self-pay | Admitting: Emergency Medicine

## 2020-09-06 ENCOUNTER — Other Ambulatory Visit: Payer: Self-pay

## 2020-09-06 DIAGNOSIS — W503XXA Accidental bite by another person, initial encounter: Secondary | ICD-10-CM | POA: Diagnosis not present

## 2020-09-06 DIAGNOSIS — Z794 Long term (current) use of insulin: Secondary | ICD-10-CM | POA: Insufficient documentation

## 2020-09-06 DIAGNOSIS — I2510 Atherosclerotic heart disease of native coronary artery without angina pectoris: Secondary | ICD-10-CM | POA: Insufficient documentation

## 2020-09-06 DIAGNOSIS — Z23 Encounter for immunization: Secondary | ICD-10-CM | POA: Diagnosis not present

## 2020-09-06 DIAGNOSIS — Z79899 Other long term (current) drug therapy: Secondary | ICD-10-CM | POA: Diagnosis not present

## 2020-09-06 DIAGNOSIS — S4991XA Unspecified injury of right shoulder and upper arm, initial encounter: Secondary | ICD-10-CM | POA: Diagnosis present

## 2020-09-06 DIAGNOSIS — J45909 Unspecified asthma, uncomplicated: Secondary | ICD-10-CM | POA: Insufficient documentation

## 2020-09-06 DIAGNOSIS — E119 Type 2 diabetes mellitus without complications: Secondary | ICD-10-CM | POA: Insufficient documentation

## 2020-09-06 DIAGNOSIS — S1091XA Abrasion of unspecified part of neck, initial encounter: Secondary | ICD-10-CM | POA: Insufficient documentation

## 2020-09-06 DIAGNOSIS — I5042 Chronic combined systolic (congestive) and diastolic (congestive) heart failure: Secondary | ICD-10-CM | POA: Diagnosis not present

## 2020-09-06 DIAGNOSIS — S41151A Open bite of right upper arm, initial encounter: Secondary | ICD-10-CM | POA: Insufficient documentation

## 2020-09-06 DIAGNOSIS — Y990 Civilian activity done for income or pay: Secondary | ICD-10-CM | POA: Insufficient documentation

## 2020-09-06 DIAGNOSIS — I110 Hypertensive heart disease with heart failure: Secondary | ICD-10-CM | POA: Insufficient documentation

## 2020-09-06 MED ORDER — AMOXICILLIN-POT CLAVULANATE 875-125 MG PO TABS
1.0000 | ORAL_TABLET | Freq: Once | ORAL | Status: AC
  Administered 2020-09-06: 1 via ORAL
  Filled 2020-09-06: qty 1

## 2020-09-06 MED ORDER — AMOXICILLIN-POT CLAVULANATE 875-125 MG PO TABS: 1.0000 | ORAL_TABLET | Freq: Two times a day (BID) | ORAL | 0 refills | Status: AC

## 2020-09-06 MED ORDER — TETANUS-DIPHTH-ACELL PERTUSSIS 5-2.5-18.5 LF-MCG/0.5 IM SUSY
0.5000 mL | PREFILLED_SYRINGE | Freq: Once | INTRAMUSCULAR | Status: AC
  Administered 2020-09-06: 0.5 mL via INTRAMUSCULAR
  Filled 2020-09-06: qty 0.5
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
## 2020-09-06 NOTE — ED Notes (Signed)
AC Rob at bedside with Pt

## 2020-09-06 NOTE — Discharge Instructions (Signed)
Return for any increasing redness, drainage or other changes. Follow up with employee health for their recommendations as discussed.

## 2020-09-06 NOTE — ED Notes (Signed)
Unable to obtain BP or oral temperature at this time.  Attempted multiple times BP cuff not working, pt drank water prior to triage oral temp not working.

## 2020-09-06 NOTE — ED Provider Notes (Incomplete)
[facility] Emergency Department Provider Note  {** REMINDER - THIS NOTE IS NOT A FINAL MEDICAL RECORD UNTIL IT IS SIGNED.  UNTIL THEN, THE CONTENT BELOW MAY REFLECT INFORMATION FROM A DOCUMENTATION TEMPLATE, NOT THE ACTUAL PATIENT VISIT. **} ____________________________________________   Event Date/Time   First MD Initiated Contact with Patient 09/06/20 2148     (approximate)  I have reviewed the triage vital signs and the nursing notes.   HISTORY  Chief Complaint Human Bite  {**Delete this block, or insert here any limitations to your history or physical exam, such as chronic dementia, altered mental status, severe respiratory distress, intoxication, etc.**}  HPI Jose Merritt is a 45 y.o. male ***        {**SYMPTOM/COMPLAINT  LOCATION (describe anatomically) DURATION (when did it start) TIMING (onset and pattern) SEVERITY (0-10, mild/moderate/severe) QUALITY (description of symptoms) CONTEXT (recent surgery, new meds, activity, etc.) MODIFYINGFACTORS (what makes it better/worse) ASSOCIATEDSYMPTOMS (pertinent positives and negatives)**} Past Medical History:  Diagnosis Date  . Asthma   . Bipolar disorder (HCC)   . CHF (congestive heart failure) (HCC)   . Chicken pox   . Clotting disorder (HCC)    lung  . Depression   . Diabetes mellitus without complication (HCC) 2003   Type 2  . Heart murmur    as child  . Hyperlipidemia   . Hypertension   . Myocardial infarction (HCC) 06/2015  . Retinopathy of both eyes 01/2016  . SOB (shortness of breath) 08/17/2016  . Stroke [facility])     Patient Active Problem List   Diagnosis Date Noted  . Screening for prostate cancer 04/27/2018  . TIA due to embolism (HCC) 10/07/2017  . Neck pain 08/04/2017  . Aphasia 04/30/2017  . Coronary artery disease of native artery of native heart with stable angina pectoris (HCC) 02/07/2017  . Type 2 diabetes mellitus with circulatory disorder (HCC)  02/07/2017  . Chronic combined systolic and diastolic CHF (congestive heart failure) (HCC) 02/07/2017  . Heart failure (HCC) 11/18/2016  . Bipolar disorder, in partial remission, most recent episode depressed (HCC) 09/30/2016  . HTN (hypertension) 09/30/2016  . Mixed hyperlipidemia 09/30/2016  . History of CVA (cerebrovascular accident) 09/30/2016  . History of MI (myocardial infarction) 09/30/2016    Past Surgical History:  Procedure Laterality Date  . CORONARY ANGIOPLASTY WITH STENT PLACEMENT Left 06/2015   LADx1 stent  . WISDOM TOOTH EXTRACTION      Prior to Admission medications   Medication Sig Start Date End Date Taking? Authorizing Provider  atorvastatin (LIPITOR) 80 MG tablet Take 1 tablet (80 mg total) by mouth daily. qam 07/04/18   Antonieta Iba, MD  clopidogrel (PLAVIX) 75 MG tablet TAKE 1 TABLET BY MOUTH DAILY EVERY MORNING 01/05/20 01/04/21  Antonieta Iba, MD  ezetimibe (ZETIA) 10 MG tablet Take 1 tablet (10 mg total) by mouth daily. 07/04/18   Antonieta Iba, MD  gabapentin (NEURONTIN) 300 MG capsule Take 400 mg by mouth at bedtime. 08/29/19   [provider]  INS SYRINGE/NEEDLE 1CC/28G (B-D INSULIN SYRINGE 1CC/28G) 28G X 1/2" 1 ML MISC Use daily for insulin administration 09/06/17   Allegra Grana, FNP  insulin glargine (LANTUS) 100 UNIT/ML injection Inject 0.25 mLs (25 Units total) into the skin daily. Increase by 1 units/day until fasting BS are < 150 Patient taking differently: Inject 20 Units into the skin daily. Increase by 1 units/day until fasting BS are < 150 08/06/17   Arnett, Lyn Records, FNP  insulin lispro (HUMALOG) 100 UNIT/ML KiwkPen Inject 0.12 mLs (12 Units total) into the skin 3 (three) times daily with meals. Patient taking differently: Inject 13 Units into the skin 3 (three) times daily with meals. 08/06/17   Allegra Grana, FNP  Insulin Pen Needle 33G X 5 MM MISC 1 application by Does not apply route 4 (four) times daily as needed. 04/01/17    Allegra Grana, FNP  lisinopril (PRINIVIL,ZESTRIL) 5 MG tablet Take 1 tablet (5 mg total) by mouth daily. qam 07/04/18   Antonieta Iba, MD  metoprolol succinate (TOPROL-XL) 100 MG 24 hr tablet Take 1 tablet (100 mg total) by mouth daily. Take with or immediately following a meal. qam 07/04/18   Gollan, Tollie Pizza, MD  nitroGLYCERIN (NITROSTAT) 0.3 MG SL tablet Place 1 tablet (0.3 mg total) under the tongue every 5 (five) minutes as needed. 11/18/16   Allegra Grana, FNP  rivaroxaban (XARELTO) 2.5 MG TABS tablet TAKE 1 TABLET BY MOUTH TWICE DAILY 01/08/20 01/07/21  Antonieta Iba, MD  spironolactone (ALDACTONE) 25 MG tablet TAKE 1/2 TABLET BY MOUTH DAILY EVERY MORNING 05/23/20 05/23/21  Antonieta Iba, MD    Allergies Metrizamide, Other, Sulfa antibiotics, and Contrast media [iodinated diagnostic agents]  Family History  Problem Relation Age of Onset  . Hypertension Mother   . Arthritis Mother   . Heart disease Father        3 heart attacks  . Diabetes Father   . Hypertension Father   . Hyperlipidemia Father   . Alcohol abuse Father   . Hypertension Brother   . Hyperlipidemia Brother   . Heart disease Maternal Uncle   . Heart disease Paternal Aunt   . Heart disease Maternal Grandfather   . Hyperlipidemia Maternal Grandfather   . Diabetes Maternal Grandfather   . Kidney disease Maternal Grandfather   . Heart disease Paternal Grandmother   . Kidney disease Paternal Grandmother   . Hypertension Maternal Grandmother   . Kidney disease Maternal Grandmother   . Arthritis Maternal Grandmother   . Kidney disease Paternal Grandfather   . Colon cancer Neg Hx     Social History Social History   Tobacco Use  . Smoking status: Never Smoker  . Smokeless tobacco: Never Used  Vaping Use  . Vaping Use: Never used  Substance Use Topics  . Alcohol use: No    Comment: rare- 2x/year  . Drug use: No    Review of Systems {** Revise as appropriate then delete this line -  Documentation of 10 systems is required  **} Constitutional: No fever/chills Eyes: No visual changes. ENT: No sore throat. Cardiovascular: Denies chest pain. Respiratory: Denies shortness of breath. Gastrointestinal: No abdominal pain.  No nausea, no vomiting.  No diarrhea.  No constipation. Genitourinary: Negative for dysuria. Musculoskeletal: Negative for back pain. Skin: Negative for rash. Neurological: Negative for headaches, focal weakness or numbness. {**Psychiatric:  Endocrine:  Hematological/Lymphatic:  Allergic/Immunilogical: **}  ____________________________________________   PHYSICAL EXAM:  VITAL SIGNS: ED Triage Vitals  Enc Vitals Group     BP --      Pulse Rate 09/06/20 2145 (!) 107     Resp 09/06/20 2145 16     Temp --      Temp src --      SpO2 09/06/20 2145 98 %     Weight 09/06/20 2142 217 lb (98.4 kg)     Height 09/06/20 2142 6' (1.829 m)     Head Circumference --  Peak Flow --      Pain Score 09/06/20 2141 4     Pain Loc --      Pain Edu? --      Excl. in GC? --    {** Revise as appropriate then delete this line - 8 systems required **} Constitutional: Alert and oriented. Well appearing and in no acute distress. Eyes: Conjunctivae are normal. PERRL. EOMI. Head: Atraumatic. Nose: No congestion/rhinnorhea. Mouth/Throat: Mucous membranes are moist.  Oropharynx non-erythematous. Neck: No stridor.  {**No cervical spine tenderness to palpation.**} {**Hematological/Lymphatic/Immunilogical: No cervical lymphadenopathy. **}Cardiovascular: Normal rate, regular rhythm. Grossly normal heart sounds.  Good peripheral circulation. Respiratory: Normal respiratory effort.  No retractions. Lungs CTAB. Gastrointestinal: Soft and nontender. No distention. No abdominal bruits. No CVA tenderness. {**Genitourinary:  **}Musculoskeletal: No lower extremity tenderness nor edema.  No joint effusions. Neurologic:  Normal speech and language. No gross focal neurologic  deficits are appreciated. No gait instability. Skin:  Skin is warm, dry and intact. No rash noted. Psychiatric: Mood and affect are normal. Speech and behavior are normal.  ____________________________________________   LABS (all labs ordered are listed, but only abnormal results are displayed)  Labs Reviewed - No data to display ____________________________________________  EKG  *** ____________________________________________  RADIOLOGY I, Lucy Chris, personally viewed and evaluated these images (plain radiographs) as part of my medical decision making, as well as reviewing the written report by the radiologist.  ED MD interpretation:  ***  Official radiology report(s): No results found.  ____________________________________________   PROCEDURES  Procedure(s) performed (including Critical Care):  Procedures   ____________________________________________   INITIAL IMPRESSION / ASSESSMENT AND PLAN / ED COURSE  As part of my medical decision making, I reviewed the following data within the electronic MEDICAL RECORD NUMBER {[id number]::"Notes from prior ED visits","[place] Controlled Substance Database"}        ***      ____________________________________________   FINAL CLINICAL IMPRESSION(S) / ED DIAGNOSES  Final diagnoses:  None     ED Discharge Orders    None      *Please note:  Jose Merritt was evaluated in Emergency Department on 09/06/2020 for the symptoms described in the history of present illness. He was evaluated in the context of the global COVID-19 pandemic, which necessitated consideration that the patient might be at risk for infection with the SARS-CoV-2 virus that causes COVID-19. Institutional protocols and algorithms that pertain to the evaluation of patients at risk for COVID-19 are in a state of rapid change based on information released by regulatory bodies including the CDC and federal and state organizations. These policies  and algorithms were followed during the patient's care in the ED.  Some ED evaluations and interventions may be delayed as a result of limited staffing during and the pandemic.*   Note:  This document was prepared using Dragon voice recognition software and may include unintentional dictation errors.
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
## 2020-09-14 DIAGNOSIS — E1165 Type 2 diabetes mellitus with hyperglycemia: Secondary | ICD-10-CM | POA: Diagnosis not present

## 2020-10-11 ENCOUNTER — Other Ambulatory Visit: Payer: Self-pay

## 2020-10-11 ENCOUNTER — Other Ambulatory Visit: Payer: Self-pay | Admitting: Cardiovascular Disease

## 2020-10-11 DIAGNOSIS — I10 Essential (primary) hypertension: Secondary | ICD-10-CM

## 2020-10-11 MED FILL — Spironolactone Tab 25 MG: ORAL | 40 days supply | Qty: 20 | Fill #0 | Status: AC

## 2020-10-11 MED FILL — Clopidogrel Bisulfate Tab 75 MG (Base Equiv): ORAL | 90 days supply | Qty: 90 | Fill #0 | Status: AC

## 2020-10-11 MED FILL — Rivaroxaban Tab 2.5 MG: ORAL | 90 days supply | Qty: 180 | Fill #0 | Status: AC

## 2020-10-14 ENCOUNTER — Other Ambulatory Visit: Payer: Self-pay

## 2020-10-14 DIAGNOSIS — I252 Old myocardial infarction: Secondary | ICD-10-CM

## 2020-10-14 DIAGNOSIS — Z8673 Personal history of transient ischemic attack (TIA), and cerebral infarction without residual deficits: Secondary | ICD-10-CM

## 2020-10-14 DIAGNOSIS — I10 Essential (primary) hypertension: Secondary | ICD-10-CM

## 2020-10-14 MED ORDER — METOPROLOL SUCCINATE ER 100 MG PO TB24
100.0000 mg | ORAL_TABLET | Freq: Every day | ORAL | 3 refills | Status: DC
  Filled 2020-10-14: qty 30, 30d supply, fill #0

## 2020-10-14 MED ORDER — ATORVASTATIN CALCIUM 80 MG PO TABS
80.0000 mg | ORAL_TABLET | Freq: Every day | ORAL | 0 refills | Status: DC
  Filled 2020-10-14: qty 30, 30d supply, fill #0

## 2020-10-14 MED ORDER — EZETIMIBE 10 MG PO TABS
10.0000 mg | ORAL_TABLET | Freq: Every day | ORAL | 0 refills | Status: DC
  Filled 2020-10-14: qty 30, 30d supply, fill #0

## 2020-10-14 MED ORDER — LISINOPRIL 5 MG PO TABS
5.0000 mg | ORAL_TABLET | Freq: Every day | ORAL | 0 refills | Status: DC
  Filled 2020-10-14: qty 30, 30d supply, fill #0

## 2020-10-14 MED ORDER — METOPROLOL SUCCINATE ER 100 MG PO TB24
100.0000 mg | ORAL_TABLET | Freq: Every day | ORAL | 0 refills | Status: DC
  Filled 2020-10-14: qty 30, 30d supply, fill #0

## 2020-10-18 ENCOUNTER — Other Ambulatory Visit: Payer: Self-pay

## 2020-10-22 ENCOUNTER — Other Ambulatory Visit: Payer: Self-pay

## 2020-10-25 ENCOUNTER — Telehealth: Payer: Self-pay | Admitting: Family

## 2020-10-25 NOTE — Telephone Encounter (Signed)
FYI I called patient & he stated that pain was not stabbing or sharpe in his sides. It is a grabbing pain that almost feels like a spasm. He does feel it time to time on both sides. He said that on a pain scale maybe a 4. He said that this week it has gotten worse & more frequent. He stated that he felt like something isn't tight. He advised that patient be seen sooner than next Wednesday since pain worsening & he felt that something was wrong. He was agreeable to [facility] walk-in & said that he would go when they opened tomorrow (Saturday) morning. I told him that we would tentatively leave him scheduled to f/u on Wednesday with you. Pt was agreeable to plan & update [place].

## 2020-10-25 NOTE — Telephone Encounter (Signed)
Scheduled pt for appt on 10/30/20 Pt stated that he has been having ongoing side pain for the last several weeks

## 2020-10-30 ENCOUNTER — Telehealth: Payer: Self-pay | Admitting: Family

## 2020-10-30 ENCOUNTER — Other Ambulatory Visit: Payer: Self-pay

## 2020-10-30 ENCOUNTER — Ambulatory Visit (INDEPENDENT_AMBULATORY_CARE_PROVIDER_SITE_OTHER)
Admission: RE | Admit: 2020-10-30 | Discharge: 2020-10-30 | Disposition: A | Discharge status: Home / Self Care | Payer: 59 | Source: Ambulatory Visit | Attending: Family | Admitting: Family

## 2020-10-30 ENCOUNTER — Encounter: Payer: Self-pay | Admitting: Family

## 2020-10-30 ENCOUNTER — Ambulatory Visit: Payer: 59 | Admitting: Family

## 2020-10-30 VITALS — BP 130/78 | HR 74 | Temp 97.6°F | Ht 72.0 in | Wt 233.6 lb

## 2020-10-30 DIAGNOSIS — R109 Unspecified abdominal pain: Secondary | ICD-10-CM

## 2020-10-30 DIAGNOSIS — R1011 Right upper quadrant pain: Secondary | ICD-10-CM

## 2020-10-30 DIAGNOSIS — N400 Enlarged prostate without lower urinary tract symptoms: Secondary | ICD-10-CM

## 2020-10-30 DIAGNOSIS — R10A1 Flank pain, right side: Secondary | ICD-10-CM

## 2020-10-30 DIAGNOSIS — E1159 Type 2 diabetes mellitus with other circulatory complications: Secondary | ICD-10-CM | POA: Diagnosis not present

## 2020-10-30 DIAGNOSIS — K429 Umbilical hernia without obstruction or gangrene: Secondary | ICD-10-CM | POA: Diagnosis not present

## 2020-10-30 DIAGNOSIS — I10 Essential (primary) hypertension: Secondary | ICD-10-CM | POA: Diagnosis not present

## 2020-10-30 DIAGNOSIS — Z1211 Encounter for screening for malignant neoplasm of colon: Secondary | ICD-10-CM | POA: Diagnosis not present

## 2020-10-30 DIAGNOSIS — E119 Type 2 diabetes mellitus without complications: Secondary | ICD-10-CM | POA: Diagnosis not present

## 2020-10-30 NOTE — Progress Notes (Signed)
Subjective:    Patient ID: Jose Merritt, male    DOB: Mar 15, 1976, 45 y.o.   MRN: [id number]  CC: Jose Merritt is a 45 y.o. male who presents today for an acute visit.    HPI: Complains of right lower back pain x 6 months, getting worse.  NO pain today. Pain is 4/10 when occurs. Self limiting. He hasnt tried ibuprofen for the pain.   Has been seen 6 months ago at urgent care Pain has not migrated No diarrhea, constipation, nausea, rectal bleeding, hematuria. Marland Kitchen  Has noticed that he doesn't have bowel as frequent as had been in the past.   Walking a lot for work. Pain is not worse movement nor eating.  No injury.    H/o renal stone    Follow with Dr Tedd Sias for DM II and has follow up with her next month.   Due to follow up with Dr Mariah Milling, follow up next month  Never had colonoscopy. Mother has colon polys  NO h/o abdominal surgeries    HISTORY:  Past Medical History:  Diagnosis Date  . Asthma   . Bipolar disorder (HCC)   . CHF (congestive heart failure) (HCC)   . Chicken pox   . Clotting disorder (HCC)    lung  . Depression   . Diabetes mellitus without complication (HCC) 2003   Type 2  . Heart murmur    as child  . Hyperlipidemia   . Hypertension   . Myocardial infarction (HCC) 06/2015  . Retinopathy of both eyes 01/2016  . SOB (shortness of breath) 08/17/2016  . Stroke [facility])    Past Surgical History:  Procedure Laterality Date  . CORONARY ANGIOPLASTY WITH STENT PLACEMENT Left 06/2015   LADx1 stent  . WISDOM TOOTH EXTRACTION     Family History  Problem Relation Age of Onset  . Hypertension Mother   . Arthritis Mother   . Heart disease Father        3 heart attacks  . Diabetes Father   . Hypertension Father   . Hyperlipidemia Father   . Alcohol abuse Father   . Hypertension Brother   . Hyperlipidemia Brother   . Heart disease Maternal Uncle   . Heart disease Paternal Aunt   . Heart disease Maternal Grandfather   .  Hyperlipidemia Maternal Grandfather   . Diabetes Maternal Grandfather   . Kidney disease Maternal Grandfather   . Heart disease Paternal Grandmother   . Kidney disease Paternal Grandmother   . Hypertension Maternal Grandmother   . Kidney disease Maternal Grandmother   . Arthritis Maternal Grandmother   . Kidney disease Paternal Grandfather   . Colon cancer Neg Hx     Allergies: Metrizamide, Other, Sulfa antibiotics, and Contrast media [iodinated diagnostic agents] Current Outpatient Medications on File Prior to Visit  Medication Sig Dispense Refill  . atorvastatin (LIPITOR) 80 MG tablet Take 1 tablet (80 mg total) by mouth daily. qam 30 tablet 0  . clopidogrel (PLAVIX) 75 MG tablet TAKE 1 TABLET BY MOUTH DAILY EVERY MORNING 90 tablet 3  . ezetimibe (ZETIA) 10 MG tablet Take 1 tablet (10 mg total) by mouth daily. 30 tablet 0  . gabapentin (NEURONTIN) 300 MG capsule Take 400 mg by mouth at bedtime.    . insulin lispro (HUMALOG) 100 UNIT/ML KiwkPen Inject 0.12 mLs (12 Units total) into the skin 3 (three) times daily with meals. (Patient taking differently: Inject 80 Units into the skin daily. Uses in T-Slim insulin pump.)  15 mL 4  . lisinopril (ZESTRIL) 5 MG tablet Take 1 tablet (5 mg total) by mouth daily every morning 30 tablet 0  . metoprolol succinate (TOPROL-XL) 100 MG 24 hr tablet Take 1 tablet (100 mg total) by mouth daily. Take with or immediately following a meal. Please schedule office visit for further refills. Thank you! 30 tablet 0  . nitroGLYCERIN (NITROSTAT) 0.3 MG SL tablet Place 1 tablet (0.3 mg total) under the tongue every 5 (five) minutes as needed. 90 tablet 0  . rivaroxaban (XARELTO) 2.5 MG TABS tablet TAKE 1 TABLET BY MOUTH TWICE DAILY 180 tablet 3  . spironolactone (ALDACTONE) 25 MG tablet TAKE 1/2 TABLET BY MOUTH DAILY EVERY MORNING 20 tablet 0  . INS SYRINGE/NEEDLE 1CC/28G (B-D INSULIN SYRINGE 1CC/28G) 28G X 1/2" 1 ML MISC Use daily for insulin administration (Patient  not taking: Reported on 10/30/2020) 100 each 11  . insulin glargine (LANTUS) 100 UNIT/ML injection Inject 0.25 mLs (25 Units total) into the skin daily. Increase by 1 units/day until fasting BS are < 150 (Patient not taking: Reported on 10/30/2020) 10 mL 11  . Insulin Pen Needle 33G X 5 MM MISC 1 application by Does not apply route 4 (four) times daily as needed. (Patient not taking: Reported on 10/30/2020) 100 each 3   No current facility-administered medications on file prior to visit.    Social History   Tobacco Use  . Smoking status: Never Smoker  . Smokeless tobacco: Never Used  Vaping Use  . Vaping Use: Never used  Substance Use Topics  . Alcohol use: No    Comment: rare- 2x/year  . Drug use: No    Review of Systems  Constitutional: Negative for chills and fever.  Respiratory: Negative for cough.   Cardiovascular: Negative for chest pain and palpitations.  Gastrointestinal: Negative for abdominal pain, constipation, nausea and vomiting.  Genitourinary: Negative for difficulty urinating and frequency.  Musculoskeletal: Positive for back pain.  Neurological: Negative for numbness.      Objective:    BP 130/78 (BP Location: Left Arm, Patient Position: Sitting, Cuff Size: Large)   Pulse 74   Temp 97.6 F (36.4 C) (Oral)   Ht 6' (1.829 m)   Wt 233 lb 9.6 oz (106 kg)   SpO2 97%   BMI 31.68 kg/m    Physical Exam Vitals reviewed.  Constitutional:      Appearance: He is well-developed.  Cardiovascular:     Rate and Rhythm: Regular rhythm.     Heart sounds: Normal heart sounds.  Pulmonary:     Effort: Pulmonary effort is normal. No respiratory distress.     Breath sounds: Normal breath sounds. No wheezing, rhonchi or rales.  Abdominal:     Tenderness: There is no abdominal tenderness. There is no right CVA tenderness, left CVA tenderness, guarding or rebound.     Comments:    Musculoskeletal:     Lumbar back: No swelling, spasms or tenderness. Normal range of motion.      Comments: Full range of motion with flexion, extension, lateral side bends. No pain, numbness, tingling elicited with single leg raise bilaterally. No rash.  Lymphadenopathy:     Head:     Left side of head: No submandibular or preauricular adenopathy.  Skin:    General: Skin is warm and dry.  Neurological:     Mental Status: He is alert.  Psychiatric:        Speech: Speech normal.  Behavior: Behavior normal.        Assessment & Plan:   Problem List Items Addressed This Visit      Cardiovascular and Mediastinum   HTN (hypertension) - Primary     Endocrine   Type 2 diabetes mellitus with circulatory disorder (HCC)   Relevant Orders   Microalbumin / creatinine urine ratio   Hemoglobin A1c   Lipid panel     Other   Right flank pain    Patient well appearing today. No pain today nor elicited on exam. Benign abdominal exam. Dicussed differentials including renal stone, musculoskeletal pain.   Change to bowel habits and reiterated the importance screening for colon cancer particularly in the setting of symptoms.  Close follow up.        Other Visit Diagnoses    Right upper quadrant abdominal pain       Relevant Orders   Urinalysis, Routine w reflex microscopic   Microalbumin / creatinine urine ratio   CBC with Differential/Platelet   Comprehensive metabolic panel   TSH   PSA   CT RENAL STONE STUDY   Screen for colon cancer       Relevant Orders   Ambulatory referral to Gastroenterology        I am having Jose Merritt maintain his nitroGLYCERIN, Insulin Pen Needle, insulin lispro, insulin glargine, INS SYRINGE/NEEDLE 1CC/28G, gabapentin, rivaroxaban, clopidogrel, spironolactone, metoprolol succinate, atorvastatin, lisinopril, and ezetimibe.   No orders of the defined types were placed in this encounter.   Return precautions given.   Risks, benefits, and alternatives of the medications and treatment plan prescribed today were discussed, and patient  expressed understanding.   Education regarding symptom management and diagnosis given to patient on AVS.  Continue to follow with Jose Grana, FNP for routine health maintenance.   Jose Merritt and I agreed with plan.   Jose Plowman, FNP

## 2020-10-30 NOTE — Patient Instructions (Signed)
We will start with labs, imaging  Please let me know of any new symptoms or worsening features right away.

## 2020-10-30 NOTE — Assessment & Plan Note (Signed)
Patient well appearing today. No pain today nor elicited on exam. Benign abdominal exam. Dicussed differentials including renal stone, musculoskeletal pain.   Change to bowel habits and reiterated the importance screening for colon cancer particularly in the setting of symptoms.  Close follow up.

## 2020-10-30 NOTE — Telephone Encounter (Signed)
Patient scheduled 11/04/20

## 2020-10-30 NOTE — Telephone Encounter (Signed)
Call pt  and sch fasting labs since not done today

## 2020-10-30 NOTE — Telephone Encounter (Signed)
Discussed with pt findings of CT renal No renal stone, acute pathology  NO urinary hesitancy, decreased urine stream Advised of atherosclerosis and to continue statin  Advised to have labs done ( PSA and all) and scheduled pt Referral to urology for eval enlarged prostate Discussed dedicated XR thoracic, lumbar, and pt referral; he declines at this time  Advised to pay attention and journal symptom to look for any trigger

## 2020-10-30 NOTE — Addendum Note (Signed)
Addended by: Warden Fillers on: 10/30/2020 02:47 PM   Modules accepted: Orders

## 2020-10-31 ENCOUNTER — Other Ambulatory Visit: Payer: Self-pay

## 2020-10-31 LAB — URINALYSIS, ROUTINE W REFLEX MICROSCOPIC
Bilirubin Urine: NEGATIVE
Hgb urine dipstick: NEGATIVE
Ketones, ur: NEGATIVE
Leukocytes,Ua: NEGATIVE
Nitrite: NEGATIVE
RBC / HPF: NONE SEEN (ref 0–?)
Specific Gravity, Urine: <=1.005 — AB (ref 1.000–1.030)
Total Protein, Urine: NEGATIVE
Urine Glucose: >=1000 — AB
Urobilinogen, UA: 0.2 (ref 0.0–1.0)
WBC, UA: NONE SEEN (ref 0–?)
pH: 6 (ref 5.0–8.0)

## 2020-11-01 LAB — MICROALBUMIN / CREATININE URINE RATIO
Creatinine,U: 19.7 mg/dL
Microalb Creat Ratio: 22.5 mg/g (ref 0.0–30.0)
Microalb, Ur: 4.4 mg/dL — ABNORMAL HIGH (ref 0.0–1.9)

## 2020-11-04 ENCOUNTER — Other Ambulatory Visit (INDEPENDENT_AMBULATORY_CARE_PROVIDER_SITE_OTHER): Payer: 59

## 2020-11-04 ENCOUNTER — Other Ambulatory Visit: Payer: Self-pay

## 2020-11-04 DIAGNOSIS — E1159 Type 2 diabetes mellitus with other circulatory complications: Secondary | ICD-10-CM

## 2020-11-04 DIAGNOSIS — R109 Unspecified abdominal pain: Secondary | ICD-10-CM | POA: Diagnosis not present

## 2020-11-04 DIAGNOSIS — R1011 Right upper quadrant pain: Secondary | ICD-10-CM

## 2020-11-04 NOTE — Addendum Note (Signed)
Addended by: Warden Fillers on: 11/04/2020 08:46 AM   Modules accepted: Orders

## 2020-11-04 NOTE — Addendum Note (Signed)
Addended by: Warden Fillers on: 11/04/2020 10:55 AM   Modules accepted: Orders

## 2020-11-04 NOTE — Addendum Note (Signed)
Addended by: Warden Fillers on: 11/04/2020 08:45 AM   Modules accepted: Orders

## 2020-11-05 ENCOUNTER — Other Ambulatory Visit: Payer: Self-pay

## 2020-11-05 ENCOUNTER — Telehealth: Payer: Self-pay

## 2020-11-05 LAB — CBC WITH DIFFERENTIAL/PLATELET
Absolute Monocytes: 756 cells/uL (ref 200–950)
Basophils Absolute: 49 cells/uL (ref 0–200)
Basophils Relative: 0.8 %
Eosinophils Absolute: 232 cells/uL (ref 15–500)
Eosinophils Relative: 3.8 %
HCT: 41.4 % (ref 38.5–50.0)
Hemoglobin: 14.3 g/dL (ref 13.2–17.1)
Lymphs Abs: 2513 cells/uL (ref 850–3900)
MCH: 30 pg (ref 27.0–33.0)
MCHC: 34.5 g/dL (ref 32.0–36.0)
MCV: 86.8 fL (ref 80.0–100.0)
MPV: 11.1 fL (ref 7.5–12.5)
Monocytes Relative: 12.4 %
Neutro Abs: 2550 cells/uL (ref 1500–7800)
Neutrophils Relative %: 41.8 %
Platelets: 205 10*3/uL (ref 140–400)
RBC: 4.77 10*6/uL (ref 4.20–5.80)
RDW: 12.3 % (ref 11.0–15.0)
Total Lymphocyte: 41.2 %
WBC: 6.1 10*3/uL (ref 3.8–10.8)

## 2020-11-05 LAB — COMPREHENSIVE METABOLIC PANEL
AG Ratio: 1.5 (calc) (ref 1.0–2.5)
ALT: 20 U/L (ref 9–46)
AST: 12 U/L (ref 10–40)
Albumin: 4.1 g/dL (ref 3.6–5.1)
Alkaline phosphatase (APISO): 118 U/L (ref 36–130)
BUN: 16 mg/dL (ref 7–25)
CO2: 26 mmol/L (ref 20–32)
Calcium: 9.1 mg/dL (ref 8.6–10.3)
Chloride: 103 mmol/L (ref 98–110)
Creat: 0.81 mg/dL (ref 0.60–1.35)
Globulin: 2.7 g/dL (calc) (ref 1.9–3.7)
Glucose, Bld: 186 mg/dL — ABNORMAL HIGH (ref 65–99)
Potassium: 4 mmol/L (ref 3.5–5.3)
Sodium: 140 mmol/L (ref 135–146)
Total Bilirubin: 0.4 mg/dL (ref 0.2–1.2)
Total Protein: 6.8 g/dL (ref 6.1–8.1)

## 2020-11-05 LAB — PSA: PSA: 0.5 ng/mL (ref <=4.00)

## 2020-11-05 LAB — LIPID PANEL
Cholesterol: 115 mg/dL (ref <200)
HDL: 40 mg/dL (ref >=40)
LDL Cholesterol (Calc): 54 mg/dL (calc)
Non-HDL Cholesterol (Calc): 75 mg/dL (calc) (ref <130)
Total CHOL/HDL Ratio: 2.9 (calc) (ref <5.0)
Triglycerides: 126 mg/dL (ref <150)

## 2020-11-05 LAB — HEMOGLOBIN A1C
Hgb A1c MFr Bld: 12.8 % of total Hgb — ABNORMAL HIGH (ref <5.7)
Mean Plasma Glucose: 321 mg/dL
eAG (mmol/L): 17.8 mmol/L

## 2020-11-05 LAB — VITAMIN D 25 HYDROXY (VIT D DEFICIENCY, FRACTURES): Vit D, 25-Hydroxy: 27 ng/mL — ABNORMAL LOW (ref 30–100)

## 2020-11-05 LAB — TSH: TSH: 3.98 mIU/L (ref 0.40–4.50)

## 2020-11-05 MED ORDER — LISINOPRIL 2.5 MG PO TABS
2.5000 mg | ORAL_TABLET | Freq: Every day | ORAL | 4 refills | Status: DC
  Filled 2020-11-05: qty 30, 30d supply, fill #0

## 2020-11-05 NOTE — Telephone Encounter (Signed)
LMTCB for labs. 

## 2020-11-06 ENCOUNTER — Other Ambulatory Visit: Payer: Self-pay

## 2020-11-11 NOTE — Progress Notes (Signed)
11/12/2020 8:39 AM   Jose Merritt 02-17-1976 [id number]  Referring provider: Allegra Grana, FNP [address] [place],  [place] [address]  Chief Complaint  Patient presents with   Benign Prostatic Hypertrophy    HPI: 45 year old male referred for further evaluation of enlarged prostate.  He is having some right flank/back pain thought to have a possible stone.  He underwent a noncontrast CT scan renal stone protocol on 10/30/2020.  Incidentally, his prostate was noted to be mildly enlarged, estimated volume of 37 cm3.  PSA 0.5 on 11/04/2020.  Creatinine normal.  Today, he denies any urinary symptoms.  He has no family history of prostate issues.  He continues to have intermittent right flank pain of unclear etiology.  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    IPSS     Row Name 11/12/20 0800         International Prostate Symptom Score   How often have you had the sensation of not emptying your bladder? Not at All     How often have you had to urinate less than every two hours? Not at All     How often have you found you stopped and started again several times when you urinated? Not at All     How often have you found it difficult to postpone urination? Not at All     How often have you had a weak urinary stream? Not at All     How often have you had to strain to start urination? Not at All     How many times did you typically get up at night to urinate? 2 Times     Total IPSS Score 2           Quality of Life due to urinary symptoms     If you were to spend the rest of your life with your urinary condition just the way it is now how would you feel about that? Delighted             Score:  1-7 Mild 8-19 Moderate 20-35 Severe    PMH: Past Medical History:  Diagnosis Date   Asthma    Bipolar disorder (HCC)    CHF (congestive heart failure) (HCC)    Chicken  pox    Clotting disorder (HCC)    lung   Depression    Diabetes mellitus without complication (HCC) 2003   Type 2   Heart murmur    as child   Hyperlipidemia    Hypertension    Myocardial infarction (HCC) 06/2015   Retinopathy of both eyes 01/2016   SOB (shortness of breath) 08/17/2016   Stroke [facility])     Surgical History: Past Surgical History:  Procedure Laterality Date   CORONARY ANGIOPLASTY WITH STENT PLACEMENT Left 06/2015   LADx1 stent   WISDOM TOOTH EXTRACTION      Home Medications:  Allergies as of 11/12/2020       Reactions   Metrizamide Anaphylaxis   Other Anaphylaxis   Shrimp   Sulfa Antibiotics Anaphylaxis   Uncoded Allergy. Allergen: CT contrast dye. Tolerated contrast for heart catheterization 07/2015   Contrast Media [iodinated Diagnostic Agents]         Medication List        Accurate as of November 12, 2020  8:39 AM. If you have any questions, ask your  nurse or doctor.          atorvastatin 80 MG tablet Commonly known as: LIPITOR Take one tablet by mouth daily every morning (Take 1 tablet (80 mg total) by mouth daily. qam)   clopidogrel 75 MG tablet Commonly known as: PLAVIX TAKE 1 TABLET BY MOUTH DAILY EVERY MORNING   ezetimibe 10 MG tablet Commonly known as: Zetia Take 1 tablet (10 mg total) by mouth daily.   gabapentin 300 MG capsule Commonly known as: NEURONTIN Take 400 mg by mouth at bedtime.   INS SYRINGE/NEEDLE 1CC/28G 28G X 1/2" 1 ML Misc Commonly known as: B-D INSULIN SYRINGE 1CC/28G Use daily for insulin administration   insulin glargine 100 UNIT/ML injection Commonly known as: LANTUS Inject 0.25 mLs (25 Units total) into the skin daily. Increase by 1 units/day until fasting BS are < 150   insulin lispro 100 UNIT/ML KiwkPen Commonly known as: HUMALOG Inject 0.12 mLs (12 Units total) into the skin 3 (three) times daily with meals. What changed:  how much to take when to take this additional instructions   Insulin Pen  Needle 33G X 5 MM Misc 1 application by Does not apply route 4 (four) times daily as needed.   lisinopril 2.5 MG tablet Commonly known as: Zestril Take 1 tablet (2.5 mg total) by mouth daily. What changed: Another medication with the same name was removed. Continue taking this medication, and follow the directions you see here. Changed by: Vanna Scotland, MD   metoprolol succinate 100 MG 24 hr tablet Commonly known as: TOPROL-XL Take 1 tablet (100 mg total) by mouth daily. Take with or immediately following a meal. Please schedule office visit for further refills. Thank you!   nitroGLYCERIN 0.3 MG SL tablet Commonly known as: NITROSTAT Place 1 tablet (0.3 mg total) under the tongue every 5 (five) minutes as needed.   spironolactone 25 MG tablet Commonly known as: ALDACTONE TAKE 1/2 TABLET BY MOUTH DAILY EVERY MORNING   Xarelto 2.5 MG Tabs tablet Generic drug: rivaroxaban TAKE 1 TABLET BY MOUTH TWICE DAILY        Allergies:  Allergies  Allergen Reactions   Metrizamide Anaphylaxis   Other Anaphylaxis    Shrimp   Sulfa Antibiotics Anaphylaxis    Uncoded Allergy. Allergen: CT contrast dye. Tolerated contrast for heart catheterization 07/2015   Contrast Media [Iodinated Diagnostic Agents]     Family History: Family History  Problem Relation Age of Onset   Hypertension Mother    Arthritis Mother    Heart disease Father        3 heart attacks   Diabetes Father    Hypertension Father    Hyperlipidemia Father    Alcohol abuse Father    Hypertension Brother    Hyperlipidemia Brother    Heart disease Maternal Uncle    Heart disease Paternal Aunt    Heart disease Maternal Grandfather    Hyperlipidemia Maternal Grandfather    Diabetes Maternal Grandfather    Kidney disease Maternal Grandfather    Heart disease Paternal Grandmother    Kidney disease Paternal Grandmother    Hypertension Maternal Grandmother    Kidney disease Maternal Grandmother    Arthritis Maternal  Grandmother    Kidney disease Paternal Grandfather    Colon cancer Neg Hx     Social History:  reports that he has never smoked. He has never used smokeless tobacco. He reports that he does not drink alcohol and does not use drugs.   Physical Exam: BP 134/85  Pulse 82   Ht 6' (1.829 m)   Wt 235 lb (106.6 kg)   BMI 31.87 kg/m   Constitutional:  Alert and oriented, No acute distress. HEENT: [place] AT, moist mucus membranes.  Trachea midline, no masses. Cardiovascular: No clubbing, cyanosis, or edema. Respiratory: Normal respiratory effort, no increased work of breathing. Skin: No rashes, bruises or suspicious lesions. Neurologic: Grossly intact, no focal deficits, moving all 4 extremities. Psychiatric: Normal mood and affect.  Laboratory Data: Lab Results  Component Value Date   WBC 6.1 11/04/2020   HGB 14.3 11/04/2020   HCT 41.4 11/04/2020   MCV 86.8 11/04/2020   PLT 205 11/04/2020    Lab Results  Component Value Date   CREATININE 0.81 11/04/2020    Lab Results  Component Value Date   PSA 0.50 11/04/2020   Lab Results  Component Value Date   HGBA1C 12.8 (H) 11/04/2020    Urinalysis    Component Value Date/Time   COLORURINE YELLOW 10/30/2020 1431   APPEARANCEUR CLEAR 10/30/2020 1431   LABSPEC <=1.005 (A) 10/30/2020 1431   PHURINE 6.0 10/30/2020 1431   GLUCOSEU >=1000 (A) 10/30/2020 1431   HGBUR NEGATIVE 10/30/2020 1431   BILIRUBINUR NEGATIVE 10/30/2020 1431   BILIRUBINUR negative 05/21/2020 1128   KETONESUR NEGATIVE 10/30/2020 1431   PROTEINUR negative 05/21/2020 1128   UROBILINOGEN 0.2 10/30/2020 1431   NITRITE NEGATIVE 10/30/2020 1431   LEUKOCYTESUR NEGATIVE 10/30/2020 1431   Pertinent Imaging:  CT RENAL STONE STUDY  Narrative CLINICAL DATA:  RIGHT flank pain suspected kidney stone, RIGHT upper quadrant pain for 4 months increased in past 2-3 weeks. History diabetes mellitus, coronary artery disease post MI, CHF, hypertension  EXAM: CT ABDOMEN  AND PELVIS WITHOUT CONTRAST  TECHNIQUE: Multidetector CT imaging of the abdomen and pelvis was performed following the standard protocol without IV contrast. Sagittal and coronal MPR images reconstructed from axial data set. No oral contrast administered.  COMPARISON:  None  FINDINGS: Lower chest: Lung bases clear  Hepatobiliary: Gallbladder and liver normal appearance  Pancreas: Normal appearance  Spleen: Normal appearance  Adrenals/Urinary Tract: Adrenal glands normal appearance. Kidneys, ureters, and bladder normal appearance  Stomach/Bowel: Normal appendix.  Vascular/Lymphatic: Minimal atherosclerotic calcification aorta. Aorta normal caliber. No adenopathy.  Reproductive: Mild prostatic enlargement measuring 4.8 x 3.8 x 3.6 cm (volume = 34 cm^3)  Other: No free air or free fluid. Tiny umbilical hernia containing fat. No definite inflammatory process.  Musculoskeletal: No acute osseous findings.  IMPRESSION: Mild prostatic enlargement.  Tiny umbilical hernia containing fat.  No acute intra-abdominal or intrapelvic abnormalities.  Specifically, no evidence of urinary tract calcification or dilatation.  Aortic Atherosclerosis (ICD10-I70.0).   Electronically Signed By: Ulyses Southward M.D. On: 10/30/2020 16:02   Above CT scan was personally reviewed, agree with radiologic interpretation.  There is no GU pathology here, mildly enlarged prostate but no sequela of chronic outlet obstruction, normal-appearing bladder.  Assessment & Plan:    1. Right flank pain Etiology unclear, likely musculoskeletal  Above CT scan was reviewed, no stones or any other GU pathology to suggest that his pain is renal in origin  2. Enlarged prostate Incidentally mildly enlarged prostate on CT scan but otherwise is completely asymptomatic, adequate emptying today with very low IPSS  No further work-up is indicated unless he develops symptoms - BLADDER SCAN AMB  NON-IMAGING   F/u prn  Vanna Scotland, MD  [facility] [address] [place], [place] [address] [phone]

## 2020-11-12 ENCOUNTER — Ambulatory Visit: Payer: 59 | Admitting: Urology

## 2020-11-12 ENCOUNTER — Telehealth: Payer: Self-pay | Admitting: *Deleted

## 2020-11-12 ENCOUNTER — Other Ambulatory Visit: Payer: Self-pay

## 2020-11-12 ENCOUNTER — Other Ambulatory Visit: Payer: Self-pay | Admitting: Family

## 2020-11-12 ENCOUNTER — Encounter: Payer: Self-pay | Admitting: Urology

## 2020-11-12 VITALS — BP 134/85 | HR 82 | Ht 72.0 in | Wt 235.0 lb

## 2020-11-12 DIAGNOSIS — R109 Unspecified abdominal pain: Secondary | ICD-10-CM

## 2020-11-12 DIAGNOSIS — N400 Enlarged prostate without lower urinary tract symptoms: Secondary | ICD-10-CM

## 2020-11-12 DIAGNOSIS — E1159 Type 2 diabetes mellitus with other circulatory complications: Secondary | ICD-10-CM

## 2020-11-12 LAB — BLADDER SCAN AMB NON-IMAGING: Scan Result: 0

## 2020-11-12 NOTE — Telephone Encounter (Signed)
Please place future orders for lab appt.  

## 2020-11-13 ENCOUNTER — Other Ambulatory Visit: Payer: 59

## 2020-11-19 ENCOUNTER — Other Ambulatory Visit: Payer: Self-pay

## 2020-11-22 ENCOUNTER — Other Ambulatory Visit: Payer: Self-pay

## 2020-12-05 ENCOUNTER — Other Ambulatory Visit (HOSPITAL_COMMUNITY): Payer: Self-pay

## 2020-12-10 ENCOUNTER — Other Ambulatory Visit (HOSPITAL_COMMUNITY): Payer: Self-pay

## 2020-12-10 DIAGNOSIS — E1159 Type 2 diabetes mellitus with other circulatory complications: Secondary | ICD-10-CM | POA: Diagnosis not present

## 2020-12-11 DIAGNOSIS — E1165 Type 2 diabetes mellitus with hyperglycemia: Secondary | ICD-10-CM | POA: Diagnosis not present

## 2020-12-17 ENCOUNTER — Other Ambulatory Visit: Payer: Self-pay

## 2020-12-17 DIAGNOSIS — E1159 Type 2 diabetes mellitus with other circulatory complications: Secondary | ICD-10-CM | POA: Diagnosis not present

## 2020-12-17 DIAGNOSIS — Z9114 Patient's other noncompliance with medication regimen: Secondary | ICD-10-CM | POA: Diagnosis not present

## 2020-12-17 DIAGNOSIS — E1165 Type 2 diabetes mellitus with hyperglycemia: Secondary | ICD-10-CM | POA: Diagnosis not present

## 2020-12-17 DIAGNOSIS — F32A Depression, unspecified: Secondary | ICD-10-CM | POA: Diagnosis not present

## 2020-12-17 MED ORDER — LISINOPRIL 5 MG PO TABS
ORAL_TABLET | ORAL | 3 refills | Status: DC
  Filled 2020-12-17: qty 90, 90d supply, fill #0

## 2020-12-17 MED ORDER — METOPROLOL SUCCINATE ER 100 MG PO TB24
ORAL_TABLET | ORAL | 3 refills | Status: DC
  Filled 2020-12-17: qty 90, 90d supply, fill #0
  Filled 2021-04-27: qty 90, 90d supply, fill #1
  Filled 2021-08-06: qty 90, 90d supply, fill #2
  Filled 2021-10-07: qty 90, 90d supply, fill #3

## 2020-12-17 MED ORDER — EZETIMIBE 10 MG PO TABS
10.0000 mg | ORAL_TABLET | Freq: Every day | ORAL | 3 refills | Status: DC
  Filled 2020-12-17: qty 90, 90d supply, fill #0

## 2020-12-17 MED ORDER — ATORVASTATIN CALCIUM 80 MG PO TABS
ORAL_TABLET | ORAL | 3 refills | Status: DC
  Filled 2020-12-17: qty 90, 90d supply, fill #0

## 2020-12-17 MED ORDER — GABAPENTIN 300 MG PO CAPS
ORAL_CAPSULE | ORAL | 3 refills | Status: DC
  Filled 2020-12-17: qty 360, 90d supply, fill #0
  Filled 2021-04-27: qty 360, 90d supply, fill #1

## 2020-12-17 MED ORDER — INSULIN LISPRO 100 UNIT/ML IJ SOLN
INTRAMUSCULAR | 3 refills | Status: DC
  Filled 2020-12-17: qty 120, 84d supply, fill #0
  Filled 2021-04-27: qty 120, 84d supply, fill #1
  Filled 2021-08-06: qty 120, 84d supply, fill #2
  Filled 2021-10-07 – 2021-10-08 (×2): qty 120, 84d supply, fill #3

## 2020-12-18 ENCOUNTER — Ambulatory Visit: Payer: 59 | Admitting: Family

## 2020-12-18 ENCOUNTER — Ambulatory Visit: Payer: 59 | Admitting: Cardiovascular Disease

## 2020-12-18 NOTE — Progress Notes (Deleted)
Cardiology Office Note  Date:  12/18/2020   ID:  Collie, Wernick 1975-11-13, MRN [id number]  PCP:  Allegra Grana, FNP   No chief complaint on file.   HPI:  Jose Merritt is a pleasant 45 year old gentleman with history of CAD, STEMI Stent to LAD 06/2015, initially presented with shoulder pain neck pain, shortness of breath/anginal equivalent DM II Bipolar/depression HTN Hyperlipidemia Obesity TIA x2 in 2018 Hx of PE Initial ejection fraction down to 20% in early 2017 after STEMI, Treated with warfarin for LV thrombus 6 months EF Up to 40% on follow-up echo 2017 Who presents for management of his coronary disease  Last office visit April 2021  In follow-up today reports that he is doing well Non smoker  Lab work reviewed HBA1C 6.2 last visit now up to 10.7 Used to be 13 Total 99 LDL 39    No palpitations, seem to improve as his diabetes numbers improved No angina, no shortness of breath on exertion No regular exercise program  EKG personally reviewed by myself on todays visit Shows normal sinus rhythm with rate 82 bpm no significant ST or T wave changes, unable to exclude old anterior MI  Other past medical history reviewed Tia x2 November 2018 reports that he was Getting an ice cream, Started complaining of arm pain, people reported he was talking off topic, Left arm, left shoulder numb He went to the hospital, [facility] CT head ok MRI ok Recent carotid ultrasound with no significant disease  Earlier in 2018 also reported having similar symptoms Went to hospital in [place], results not available   left arm numb again No speech problem that time  Details not clear but reports that he was given a medication at the time that he had significant allergy to  Admitted 1/31-07/11/2015 for STEMI. 1 week prior to presentation he had left shoulder pain   treated at urgent care for bursitis with a steroid injection.   In the few days prior to  presentation,   EKG with ST elevation. Cath with occluded mid LAD treated with DES. No residual disease.   Echo with ejection fraction 20% with probable LV thrombus. Started on warfarin. Metoprolol succinate, atorvastatin and lisinopril also initiated.   08/29/15 Repeat Cardiac cath, stable , stents patent Echocardiogram with improved ejection fraction (40%) and resolution of LV apical thrombus. Lifevest stopped Warfarin stopped, but he continued to take   followed by endocrinologist, Dr Vincente Liberty with DM nutritionist   Bipolar-seen psychiatrist;   Previous echocardiogram March 2017, ejection fraction 40%, up from 19%   PMH:   has a past medical history of Asthma, Bipolar disorder (HCC), CHF (congestive heart failure) (HCC), Chicken pox, Clotting disorder (HCC), Depression, Diabetes mellitus without complication (HCC) (2003), Heart murmur, Hyperlipidemia, Hypertension, Myocardial infarction (HCC) (06/2015), Retinopathy of both eyes (01/2016), SOB (shortness of breath) (08/17/2016), and Stroke (HCC).  PSH:    Past Surgical History:  Procedure Laterality Date   CORONARY ANGIOPLASTY WITH STENT PLACEMENT Left 06/2015   LADx1 stent   WISDOM TOOTH EXTRACTION      Current Outpatient Medications  Medication Sig Dispense Refill   atorvastatin (LIPITOR) 80 MG tablet Take 1 tablet (80 mg total) by mouth once daily 90 tablet 3   clopidogrel (PLAVIX) 75 MG tablet TAKE 1 TABLET BY MOUTH DAILY EVERY MORNING 90 tablet 3   ezetimibe (ZETIA) 10 MG tablet Take 1 tablet (10 mg total) by mouth once daily 90 tablet 3   gabapentin (NEURONTIN) 300 MG capsule Take  400 mg by mouth at bedtime.     gabapentin (NEURONTIN) 300 MG capsule TAKE 1 TO 2 CAPSULES BY MOUTH TWICE DAILY FOR FOOT PAIN 360 capsule 3   INS SYRINGE/NEEDLE 1CC/28G (B-D INSULIN SYRINGE 1CC/28G) 28G X 1/2" 1 ML MISC Use daily for insulin administration 100 each 11   insulin glargine (LANTUS) 100 UNIT/ML injection Inject 0.25 mLs (25  Units total) into the skin daily. Increase by 1 units/day until fasting BS are < 150 10 mL 11   insulin lispro (HUMALOG) 100 UNIT/ML injection Take up to 150 units daily via insulin pump 140 mL 3   insulin lispro (HUMALOG) 100 UNIT/ML KiwkPen Inject 0.12 mLs (12 Units total) into the skin 3 (three) times daily with meals. (Patient taking differently: Inject 80 Units into the skin daily. Uses in T-Slim insulin pump.) 15 mL 4   Insulin Pen Needle 33G X 5 MM MISC 1 application by Does not apply route 4 (four) times daily as needed. 100 each 3   lisinopril (ZESTRIL) 2.5 MG tablet Take 1 tablet (2.5 mg total) by mouth daily. 30 tablet 4   lisinopril (ZESTRIL) 5 MG tablet Take 1 tablet (5 mg total) by mouth once daily 90 tablet 3   metoprolol succinate (TOPROL-XL) 100 MG 24 hr tablet Take 1 tablet (100 mg total) by mouth nightly 90 tablet 3   nitroGLYCERIN (NITROSTAT) 0.3 MG SL tablet Place 1 tablet (0.3 mg total) under the tongue every 5 (five) minutes as needed. 90 tablet 0   rivaroxaban (XARELTO) 2.5 MG TABS tablet TAKE 1 TABLET BY MOUTH TWICE DAILY 180 tablet 3   spironolactone (ALDACTONE) 25 MG tablet TAKE 1/2 TABLET BY MOUTH DAILY EVERY MORNING 20 tablet 0   No current facility-administered medications for this visit.     Allergies:   Metrizamide, Other, Sulfa antibiotics, and Contrast media [iodinated diagnostic agents]   Social History:  The patient  reports that he has never smoked. He has never used smokeless tobacco. He reports that he does not drink alcohol and does not use drugs.   Family History:   family history includes Alcohol abuse in his father; Arthritis in his maternal grandmother and mother; Diabetes in his father and maternal grandfather; Heart disease in his father, maternal grandfather, maternal uncle, paternal aunt, and paternal grandmother; Hyperlipidemia in his brother, father, and maternal grandfather; Hypertension in his brother, father, maternal grandmother, and mother;  Kidney disease in his maternal grandfather, maternal grandmother, paternal grandfather, and paternal grandmother.    Review of Systems: Review of Systems  Constitutional: Negative.   HENT: Negative.    Respiratory: Negative.    Cardiovascular: Negative.   Gastrointestinal: Negative.   Musculoskeletal: Negative.   Neurological: Negative.   Psychiatric/Behavioral: Negative.    All other systems reviewed and are negative. PHYSICAL EXAM: VS:  There were no vitals taken for this visit. , BMI There is no height or weight on file to calculate BMI. Constitutional:  oriented to person, place, and time. No distress.  HENT:  Head: Grossly normal Eyes:  no discharge. No scleral icterus.  Neck: No JVD, no carotid bruits  Cardiovascular: Regular rate and rhythm, no murmurs appreciated Pulmonary/Chest: Clear to auscultation bilaterally, no wheezes or rails Abdominal: Soft.  no distension.  no tenderness.  Musculoskeletal: Normal range of motion Neurological:  normal muscle tone. Coordination normal. No atrophy Skin: Skin warm and dry Psychiatric: normal affect, pleasant   Recent Labs: 11/04/2020: ALT 20; BUN 16; Creat 0.81; Hemoglobin 14.3; Platelets 205; Potassium  4.0; Sodium 140; TSH 3.98    Lipid Panel Lab Results  Component Value Date   CHOL 115 11/04/2020   HDL 40 11/04/2020   LDLCALC 54 11/04/2020   TRIG 126 11/04/2020      Wt Readings from Last 3 Encounters:  11/12/20 235 lb (106.6 kg)  10/30/20 233 lb 9.6 oz (106 kg)  09/06/20 217 lb (98.4 kg)      ASSESSMENT AND PLAN:  Coronary artery disease of native artery of native heart with stable angina pectoris (HCC) - Plan: EKG 12-Lead Tolerating Plavix and Xarelto 2.5 mg po BID Diabetes and cholesterol numbers dramatically improved Weight high but stable 240  Mixed hyperlipidemia Continue Zetia and Lipitor Cholesterol at goal  TIA He was evaluated in the hospital twice for TIA symptoms  Plavix and start Xarelto 2.5  twice daily No further episodes  Type 2 diabetes mellitus with other circulatory complication, unspecified whether long term insulin use (HCC) Dramatic improvement in A1c Fantastic numbers  Bipolar disorder, in partial remission, most recent episode depressed (HCC) Stable managed by primary care  Chronic combined systolic and diastolic CHF (congestive heart failure) (HCC)  Ejection fraction 40% March 2017 Previously discussed changing to [facility], declined We have offered echocardiogram if he chooses to reevaluate ejection fraction, prefers to hold off at this time Appears relatively euvolemic, no changes made  Disposition:   F/U  12 months   Total encounter time more than 25 minutes  Greater than 50% was spent in counseling and coordination of care with the patient    No orders of the defined types were placed in this encounter.    Signed, Dossie Arbour, M.D., Ph.D. 12/18/2020  [facility] [place], [place] [phone]

## 2020-12-19 ENCOUNTER — Other Ambulatory Visit: Payer: Self-pay

## 2021-01-02 ENCOUNTER — Ambulatory Visit: Payer: 59 | Admitting: Nurse Practitioner

## 2021-02-09 NOTE — Progress Notes (Addendum)
Cardiology Office Note  Date:  02/10/2021   ID:  Ardean, Melroy Sep 12, 1975, MRN [id number]  PCP:  Allegra Grana, FNP   Chief Complaint  Patient presents with   Follow-up    Patient c/o shortness of breath, chest discomfort at times and LE edema. Medications reviewed by the patient verbally.     HPI:  Mr. 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, lots of steps Some shortness of breath which he attributes to weight gain, no chest pain concerning for angina Prior anginal symptoms right posterior shoulder pain, not having this Little bit of ankle swelling Was off insulin pump for a while now back on his pump  EKG personally reviewed by myself on todays visit Shows normal sinus rhythm with rate 82 bpm consider old anterior MI  Other past medical history reviewed Tia x2 November 2018 reports that he was Getting an ice cream, Started complaining of arm pain, people reported he was talking off topic, Left arm, left shoulder numb He went to the hospital, [facility] CT head ok MRI ok Recent carotid ultrasound with no significant disease  Earlier in 2018 also reported having similar symptoms Went to hospital in [place], results not available   left arm numb again No speech problem that time  Details not clear but reports that he was given a medication at the time that he had significant allergy to  Admitted  1/31-07/11/2015 for STEMI. 1 week prior to presentation he had left shoulder pain   treated at urgent care for bursitis with a steroid injection.   In the few days prior to presentation,   EKG with ST elevation. Cath with occluded mid LAD treated with DES. No residual disease.   Echo with ejection fraction 20% with probable LV thrombus. Started on warfarin. Metoprolol succinate, atorvastatin and lisinopril also initiated.   08/29/15 Repeat Cardiac cath, stable , stents patent Echocardiogram with improved ejection fraction (40%) and resolution of LV apical thrombus. Lifevest stopped Warfarin stopped, but he continued to take   followed by endocrinologist, Dr Vincente Liberty with DM nutritionist Bipolar-seen psychiatrist;   Previous echocardiogram March 2017, ejection fraction 40%, up from 19%   PMH:   has a past medical history of Asthma, Bipolar disorder (HCC), CHF (congestive heart failure) (HCC), Chicken pox, Clotting disorder (HCC), Depression, Diabetes mellitus without complication (HCC) (2003), Heart murmur, Hyperlipidemia, Hypertension, Myocardial infarction (HCC) (06/2015), Retinopathy of both eyes (01/2016), SOB (shortness of breath) (08/17/2016), and Stroke (HCC).  PSH:    Past Surgical History:  Procedure Laterality Date   CORONARY ANGIOPLASTY WITH STENT PLACEMENT Left 06/2015   LADx1 stent   WISDOM TOOTH EXTRACTION      Current Outpatient Medications  Medication Sig Dispense Refill   atorvastatin (LIPITOR) 80 MG tablet Take 1 tablet (80 mg total) by mouth once daily 90 tablet 3   clopidogrel (PLAVIX) 75 MG tablet Take 75 mg by mouth daily.     ezetimibe (ZETIA) 10 MG tablet  Take 1 tablet (10 mg total) by mouth once daily 90 tablet 3   gabapentin (NEURONTIN) 300 MG capsule TAKE 1 TO 2 CAPSULES BY MOUTH TWICE DAILY FOR FOOT PAIN 360 capsule 3   gabapentin (NEURONTIN) 600 MG tablet Take 600 mg by mouth at bedtime.     INS SYRINGE/NEEDLE 1CC/28G (B-D INSULIN SYRINGE 1CC/28G)  28G X 1/2" 1 ML MISC Use daily for insulin administration 100 each 11   insulin lispro (HUMALOG) 100 UNIT/ML injection Take up to 150 units daily via insulin pump 140 mL 3   Insulin Pen Needle 33G X 5 MM MISC 1 application by Does not apply route 4 (four) times daily as needed. 100 each 3   lisinopril (ZESTRIL) 5 MG tablet Take 1 tablet (5 mg total) by mouth once daily 90 tablet 3   metoprolol succinate (TOPROL-XL) 100 MG 24 hr tablet Take 1 tablet (100 mg total) by mouth nightly 90 tablet 3   nitroGLYCERIN (NITROSTAT) 0.3 MG SL tablet Place 1 tablet (0.3 mg total) under the tongue every 5 (five) minutes as needed. 90 tablet 0   rivaroxaban (XARELTO) 2.5 MG TABS tablet TAKE 1 TABLET BY MOUTH TWICE DAILY 180 tablet 3   spironolactone (ALDACTONE) 25 MG tablet TAKE 1/2 TABLET BY MOUTH DAILY EVERY MORNING 20 tablet 0   insulin glargine (LANTUS) 100 UNIT/ML injection Inject 0.25 mLs (25 Units total) into the skin daily. Increase by 1 units/day until fasting BS are < 150 (Patient not taking: Reported on 02/10/2021) 10 mL 11   insulin lispro (HUMALOG) 100 UNIT/ML KiwkPen Inject 0.12 mLs (12 Units total) into the skin 3 (three) times daily with meals. (Patient not taking: Reported on 02/10/2021) 15 mL 4   No current facility-administered medications for this visit.     Allergies:   Metrizamide, Other, Statins, Sulfa antibiotics, Sulfamethoxazole-trimethoprim, Sulfasalazine, and Contrast media [iodinated diagnostic agents]   Social History:  The patient  reports that he has never smoked. He has never used smokeless tobacco. He reports that he does not drink alcohol and does not use drugs.   Family History:   family history includes Alcohol abuse in his father; Arthritis in his maternal grandmother and mother; Diabetes in his father and maternal grandfather; Heart disease in his father, maternal grandfather, maternal uncle, paternal aunt, and paternal grandmother; Hyperlipidemia in his brother, father, and  maternal grandfather; Hypertension in his brother, father, maternal grandmother, and mother; Kidney disease in his maternal grandfather, maternal grandmother, paternal grandfather, and paternal grandmother.    Review of Systems: Review of Systems  Constitutional: Negative.   HENT: Negative.    Respiratory: Negative.    Cardiovascular: Negative.   Gastrointestinal: Negative.   Musculoskeletal: Negative.   Neurological: Negative.   Psychiatric/Behavioral: Negative.    All other systems reviewed and are negative.  PHYSICAL EXAM: VS:  BP 118/74 (BP Location: Left Arm, Patient Position: Sitting, Cuff Size: Normal)   Pulse 82   Ht 6' (1.829 m)   Wt 251 lb 8 oz (114.1 kg)   SpO2 99%   BMI 34.11 kg/m  , BMI Body mass index is 34.11 kg/m. Constitutional:  oriented to person, place, and time. No distress.  HENT:  Head: Grossly normal Eyes:  no discharge. No scleral icterus.  Neck: No JVD, no carotid bruits  Cardiovascular: Regular rate and rhythm, no murmurs appreciated Pulmonary/Chest: Clear to auscultation bilaterally, no wheezes or rails Abdominal: Soft.  no distension.  no tenderness.  Musculoskeletal: Normal range of motion Neurological:  normal  muscle tone. Coordination normal. No atrophy Skin: Skin warm and dry Psychiatric: normal affect, pleasant   Recent Labs: 11/04/2020: ALT 20; BUN 16; Creat 0.81; Hemoglobin 14.3; Platelets 205; Potassium 4.0; Sodium 140; TSH 3.98    Lipid Panel Lab Results  Component Value Date   CHOL 115 11/04/2020   HDL 40 11/04/2020   LDLCALC 54 11/04/2020   TRIG 126 11/04/2020      Wt Readings from Last 3 Encounters:  02/10/21 251 lb 8 oz (114.1 kg)  11/12/20 235 lb (106.6 kg)  10/30/20 233 lb 9.6 oz (106 kg)      ASSESSMENT AND PLAN:  Coronary artery disease of native artery of native heart with stable angina pectoris (HCC) - Plan: EKG 12-Lead Tolerating Plavix and Xarelto 2.5 mg po BID Cholesterol at goal, Long discussion  concerning diabetes numbers, he is working with endocrinology Weight high , lots of steps during the day Calorie restriction  Mixed hyperlipidemia Continue Zetia and Lipitor Cholesterol at goal  TIA He was evaluated in the hospital twice for TIA symptoms  Plavix and start Xarelto 2.5 twice daily No further episodes  Type 2 diabetes mellitus with other circulatory complication, unspecified whether long term insulin use (HCC) A1c higher, back on the insulin pump  Bipolar disorder, in partial remission, most recent episode depressed (HCC) Stable managed by primary care  Chronic combined systolic and diastolic CHF (congestive heart failure) (HCC)  Ejection fraction 40% March 2017 Previously declined Entresto Echocardiogram offered, he will check the price through insurance Currently stable On metoprolol succinate, spironolactone, lisinopril Discussed periodic need for Lasix, he will call if he would like Lasix prescription and echo   Total encounter time more than 25 minutes  Greater than 50% was spent in counseling and coordination of care with the patient    No orders of the defined types were placed in this encounter.    Signed, Dossie Arbour, M.D., Ph.D. 02/10/2021  [facility] [place], [place] [phone]

## 2021-02-10 ENCOUNTER — Other Ambulatory Visit: Payer: Self-pay

## 2021-02-10 ENCOUNTER — Ambulatory Visit: Payer: 59 | Admitting: Family

## 2021-02-10 ENCOUNTER — Encounter: Payer: Self-pay | Admitting: Cardiovascular Disease

## 2021-02-10 ENCOUNTER — Ambulatory Visit: Payer: 59 | Admitting: Cardiovascular Disease

## 2021-02-10 VITALS — BP 118/74 | HR 82 | Ht 72.0 in | Wt 251.5 lb

## 2021-02-10 DIAGNOSIS — I252 Old myocardial infarction: Secondary | ICD-10-CM

## 2021-02-10 DIAGNOSIS — Z8673 Personal history of transient ischemic attack (TIA), and cerebral infarction without residual deficits: Secondary | ICD-10-CM

## 2021-02-10 DIAGNOSIS — E782 Mixed hyperlipidemia: Secondary | ICD-10-CM | POA: Diagnosis not present

## 2021-02-10 DIAGNOSIS — I10 Essential (primary) hypertension: Secondary | ICD-10-CM

## 2021-02-10 DIAGNOSIS — I25118 Atherosclerotic heart disease of native coronary artery with other forms of angina pectoris: Secondary | ICD-10-CM

## 2021-02-10 DIAGNOSIS — E1159 Type 2 diabetes mellitus with other circulatory complications: Secondary | ICD-10-CM

## 2021-02-10 DIAGNOSIS — I5042 Chronic combined systolic (congestive) and diastolic (congestive) heart failure: Secondary | ICD-10-CM

## 2021-02-10 MED ORDER — SPIRONOLACTONE 25 MG PO TABS
ORAL_TABLET | ORAL | 3 refills | Status: DC
  Filled 2021-02-10: qty 45, 90d supply, fill #0
  Filled 2021-04-27: qty 45, 90d supply, fill #1
  Filled 2021-08-06: qty 45, 90d supply, fill #2
  Filled 2021-10-07: qty 45, 90d supply, fill #3

## 2021-02-10 MED ORDER — CLOPIDOGREL BISULFATE 75 MG PO TABS
75.0000 mg | ORAL_TABLET | Freq: Every day | ORAL | 3 refills | Status: DC
  Filled 2021-02-10: qty 90, 90d supply, fill #0
  Filled 2021-04-27: qty 90, 90d supply, fill #1
  Filled 2021-08-06: qty 90, 90d supply, fill #2
  Filled 2021-10-07: qty 90, 90d supply, fill #3

## 2021-02-10 MED ORDER — RIVAROXABAN 2.5 MG PO TABS
ORAL_TABLET | Freq: Two times a day (BID) | ORAL | 3 refills | Status: DC
Start: 2021-02-10 — End: 2022-03-04
  Filled 2021-02-10: qty 180, 90d supply, fill #0
  Filled 2021-04-27: qty 180, 90d supply, fill #1
  Filled 2021-08-06: qty 180, 90d supply, fill #2
  Filled 2021-10-07: qty 180, 90d supply, fill #3

## 2021-02-10 MED ORDER — RIVAROXABAN 2.5 MG PO TABS
ORAL_TABLET | Freq: Two times a day (BID) | ORAL | 3 refills | Status: DC
  Filled 2021-02-10: qty 180, 90d supply, fill #0

## 2021-02-10 MED ORDER — EZETIMIBE 10 MG PO TABS
10.0000 mg | ORAL_TABLET | Freq: Every day | ORAL | 3 refills | Status: DC
  Filled 2021-02-10 – 2021-04-27 (×2): qty 90, 90d supply, fill #0
  Filled 2021-08-06: qty 90, 90d supply, fill #1
  Filled 2021-10-07: qty 90, 90d supply, fill #2

## 2021-02-10 MED ORDER — SPIRONOLACTONE 25 MG PO TABS
ORAL_TABLET | ORAL | 3 refills | Status: DC
  Filled 2021-02-10: qty 45, 90d supply, fill #0

## 2021-02-10 MED ORDER — LISINOPRIL 5 MG PO TABS
ORAL_TABLET | ORAL | 3 refills | Status: DC
  Filled 2021-02-10: qty 90, fill #0
  Filled 2021-04-27: qty 90, 90d supply, fill #0

## 2021-02-10 MED ORDER — ATORVASTATIN CALCIUM 80 MG PO TABS
ORAL_TABLET | ORAL | 3 refills | Status: DC
  Filled 2021-02-10: qty 90, fill #0
  Filled 2021-04-27: qty 90, 90d supply, fill #0
  Filled 2021-08-06: qty 90, 90d supply, fill #1
  Filled 2021-10-07: qty 90, 90d supply, fill #2

## 2021-02-10 MED ORDER — NITROGLYCERIN 0.3 MG SL SUBL
0.3000 mg | SUBLINGUAL_TABLET | SUBLINGUAL | 3 refills | Status: DC | PRN
  Filled 2021-02-10: qty 100, 30d supply, fill #0

## 2021-02-10 NOTE — Patient Instructions (Addendum)
Medication Instructions:  No changes  Ask Dr. Tedd Sias about Farxiga/jardiance  Call if you would like lasix/furosemide for swelling  If you need a refill on your cardiac medications before your next appointment, please call your pharmacy.   Lab work: No new labs needed  Testing/Procedures: No new testing needed Call if you would like an echo order for shortness of breath  Follow-Up: At [facility], you and your health needs are our priority.  As part of our continuing mission to provide you with exceptional heart care, we have created designated Provider Care Teams.  These Care Teams include your primary Cardiologist (physician) and Advanced Practice Providers (APPs -  Physician Assistants and Nurse Practitioners) who all work together to provide you with the care you need, when you need it.  You will need a follow up appointment in 12 months  Providers on your designated Care Team:   Nicolasa Ducking, NP Eula Listen, PA-C Marisue Ivan, PA-C Cadence Woodworth, [place]  COVID-19 Vaccine Information can be found at: [email or web address] For questions related to vaccine distribution or appointments, please email [email or web address] or call [phone].

## 2021-02-10 NOTE — Addendum Note (Signed)
Addended by: Maple Hudson on: 02/10/2021 10:19 AM   Modules accepted: Orders

## 2021-02-11 ENCOUNTER — Other Ambulatory Visit: Payer: Self-pay

## 2021-02-12 ENCOUNTER — Other Ambulatory Visit: Payer: Self-pay

## 2021-03-06 DIAGNOSIS — E1165 Type 2 diabetes mellitus with hyperglycemia: Secondary | ICD-10-CM | POA: Diagnosis not present

## 2021-03-11 DIAGNOSIS — E1165 Type 2 diabetes mellitus with hyperglycemia: Secondary | ICD-10-CM | POA: Diagnosis not present

## 2021-03-17 ENCOUNTER — Telehealth: Payer: Self-pay | Admitting: Cardiovascular Disease

## 2021-03-17 DIAGNOSIS — R0602 Shortness of breath: Secondary | ICD-10-CM

## 2021-03-17 NOTE — Telephone Encounter (Signed)
Scheduled

## 2021-03-17 NOTE — Telephone Encounter (Signed)
Patient advised to call when ready to have echo   [facility] - advise when ordered   Billing - patient wants estimate for cost

## 2021-03-17 NOTE — Telephone Encounter (Signed)
Pt called in for wanting ECHO test at this time.  Per last OV on 02/10/2021 with Dr. Mariah Milling  "Chronic combined systolic and diastolic CHF (congestive heart failure) (HCC)  Ejection fraction 40% March 2017 Previously declined Entresto Echocardiogram offered, he will check the price through insurance" Testing/Procedures: No new testing needed Call if you would like an echo order for shortness of breath  Echo order w/dx of SOB Routed back to scheduling

## 2021-03-31 ENCOUNTER — Encounter: Payer: Self-pay | Admitting: Family

## 2021-04-02 LAB — HEMOGLOBIN A1C: Hemoglobin A1C: 10.2

## 2021-04-22 ENCOUNTER — Ambulatory Visit (INDEPENDENT_AMBULATORY_CARE_PROVIDER_SITE_OTHER): Payer: 59

## 2021-04-22 ENCOUNTER — Other Ambulatory Visit: Payer: Self-pay

## 2021-04-22 DIAGNOSIS — R0602 Shortness of breath: Secondary | ICD-10-CM

## 2021-04-22 LAB — ECHOCARDIOGRAM COMPLETE
AR max vel: 2.92 cm2
AV Area VTI: 3.04 cm2
AV Area mean vel: 2.84 cm2
AV Mean grad: 4 mmHg
AV Peak grad: 5.9 mmHg
Ao pk vel: 1.21 m/s
Area-P 1/2: 4.04 cm2
S' Lateral: 4.1 cm

## 2021-04-22 MED ORDER — PERFLUTREN LIPID MICROSPHERE
1.0000 mL | INTRAVENOUS | Status: AC | PRN
  Administered 2021-04-22: 2 mL via INTRAVENOUS

## 2021-04-28 ENCOUNTER — Telehealth: Payer: Self-pay

## 2021-04-28 ENCOUNTER — Other Ambulatory Visit: Payer: Self-pay

## 2021-04-28 NOTE — Telephone Encounter (Signed)
Attempted to reach out to pt, unable to make contact Unable to LM, mailbox full Will attempt to call back at later time

## 2021-04-29 ENCOUNTER — Other Ambulatory Visit: Payer: Self-pay

## 2021-04-29 ENCOUNTER — Telehealth: Payer: Self-pay

## 2021-04-29 NOTE — Telephone Encounter (Signed)
Able to reach pt regarding his recent ECHO Dr. Mariah Milling had a chance to review his results and advised   "Echocardiogram  Ejection fraction 35 to 40%, appears unchanged from 2017  No significant valve disease noted  There is some relaxation abnormality of left ventricle   Would consider changing lisinopril to [facility], also consideration of SGLT2 inhibitor (Farxiga versus [place] Pepper) "  Mr. Ing very thankful for the phone call of his results, reports seen the comment on MyChart from Dr. Mariah Milling, no decision on medication at this time, has appt with PCP on 12/19, will discuss options at that time, otherwise, all questions and concerns were address with nothing further at this time. Will see at next schedule f/u appt.

## 2021-05-19 ENCOUNTER — Ambulatory Visit: Payer: 59 | Admitting: Family

## 2021-06-10 DIAGNOSIS — E1165 Type 2 diabetes mellitus with hyperglycemia: Secondary | ICD-10-CM | POA: Diagnosis not present

## 2021-06-13 ENCOUNTER — Ambulatory Visit
Admission: RE | Admit: 2021-06-13 | Discharge: 2021-06-13 | Disposition: A | Discharge status: Home / Self Care | Payer: 59 | Source: Ambulatory Visit | Attending: Family | Admitting: Family

## 2021-06-13 ENCOUNTER — Other Ambulatory Visit: Payer: Self-pay | Admitting: Family

## 2021-06-13 ENCOUNTER — Other Ambulatory Visit: Payer: Self-pay

## 2021-06-13 ENCOUNTER — Ambulatory Visit: Payer: 59 | Admitting: Family

## 2021-06-13 ENCOUNTER — Other Ambulatory Visit
Admission: RE | Admit: 2021-06-13 | Discharge: 2021-06-13 | Disposition: A | Discharge status: Home / Self Care | Payer: 59 | Source: Home / Self Care | Attending: Family | Admitting: Family

## 2021-06-13 ENCOUNTER — Encounter: Payer: Self-pay | Admitting: Family

## 2021-06-13 VITALS — BP 118/68 | HR 81 | Temp 98.0°F | Ht 72.0 in | Wt 252.8 lb

## 2021-06-13 DIAGNOSIS — F3175 Bipolar disorder, in partial remission, most recent episode depressed: Secondary | ICD-10-CM | POA: Diagnosis not present

## 2021-06-13 DIAGNOSIS — I509 Heart failure, unspecified: Secondary | ICD-10-CM | POA: Diagnosis not present

## 2021-06-13 DIAGNOSIS — R0602 Shortness of breath: Secondary | ICD-10-CM | POA: Insufficient documentation

## 2021-06-13 DIAGNOSIS — E1140 Type 2 diabetes mellitus with diabetic neuropathy, unspecified: Secondary | ICD-10-CM | POA: Diagnosis not present

## 2021-06-13 DIAGNOSIS — Z794 Long term (current) use of insulin: Secondary | ICD-10-CM | POA: Diagnosis not present

## 2021-06-13 DIAGNOSIS — R600 Localized edema: Secondary | ICD-10-CM | POA: Diagnosis not present

## 2021-06-13 DIAGNOSIS — E1159 Type 2 diabetes mellitus with other circulatory complications: Secondary | ICD-10-CM

## 2021-06-13 DIAGNOSIS — I10 Essential (primary) hypertension: Secondary | ICD-10-CM | POA: Diagnosis not present

## 2021-06-13 DIAGNOSIS — I2699 Other pulmonary embolism without acute cor pulmonale: Secondary | ICD-10-CM | POA: Diagnosis not present

## 2021-06-13 DIAGNOSIS — R079 Chest pain, unspecified: Secondary | ICD-10-CM | POA: Diagnosis not present

## 2021-06-13 DIAGNOSIS — I5042 Chronic combined systolic (congestive) and diastolic (congestive) heart failure: Secondary | ICD-10-CM

## 2021-06-13 DIAGNOSIS — Z8709 Personal history of other diseases of the respiratory system: Secondary | ICD-10-CM | POA: Diagnosis not present

## 2021-06-13 LAB — COMPREHENSIVE METABOLIC PANEL
ALT: 23 U/L (ref 0–44)
AST: 18 U/L (ref 15–41)
Albumin: 4.1 g/dL (ref 3.5–5.0)
Alkaline Phosphatase: 94 U/L (ref 38–126)
Anion gap: 6 (ref 5–15)
BUN: 17 mg/dL (ref 6–20)
CO2: 26 mmol/L (ref 22–32)
Calcium: 9.1 mg/dL (ref 8.9–10.3)
Chloride: 105 mmol/L (ref 98–111)
Creatinine, Ser: 0.85 mg/dL (ref 0.61–1.24)
GFR, Estimated: >60 mL/min (ref >60)
Glucose, Bld: 88 mg/dL (ref 70–99)
Potassium: 4.3 mmol/L (ref 3.5–5.1)
Sodium: 137 mmol/L (ref 135–145)
Total Bilirubin: 0.8 mg/dL (ref 0.3–1.2)
Total Protein: 7.6 g/dL (ref 6.5–8.1)

## 2021-06-13 LAB — CBC WITH DIFFERENTIAL/PLATELET
Abs Immature Granulocytes: 0.02 10*3/uL (ref 0.00–0.07)
Basophils Absolute: 0.1 10*3/uL (ref 0.0–0.1)
Basophils Relative: 1 %
Eosinophils Absolute: 0.1 10*3/uL (ref 0.0–0.5)
Eosinophils Relative: 2 %
HCT: 42.3 % (ref 39.0–52.0)
Hemoglobin: 14.4 g/dL (ref 13.0–17.0)
Immature Granulocytes: 0 %
Lymphocytes Relative: 33 %
Lymphs Abs: 1.9 10*3/uL (ref 0.7–4.0)
MCH: 29.9 pg (ref 26.0–34.0)
MCHC: 34 g/dL (ref 30.0–36.0)
MCV: 87.9 fL (ref 80.0–100.0)
Monocytes Absolute: 0.6 10*3/uL (ref 0.1–1.0)
Monocytes Relative: 11 %
Neutro Abs: 3.1 10*3/uL (ref 1.7–7.7)
Neutrophils Relative %: 53 %
Platelets: 222 10*3/uL (ref 150–400)
RBC: 4.81 MIL/uL (ref 4.22–5.81)
RDW: 12.3 % (ref 11.5–15.5)
WBC: 5.8 10*3/uL (ref 4.0–10.5)
nRBC: 0 % (ref 0.0–0.2)

## 2021-06-13 LAB — TROPONIN I (HIGH SENSITIVITY): Troponin I (High Sensitivity): <2 ng/L (ref <18)

## 2021-06-13 LAB — D-DIMER, QUANTITATIVE: D-Dimer, Quant: 0.27 ug/mL-FEU (ref 0.00–0.50)

## 2021-06-13 LAB — BRAIN NATRIURETIC PEPTIDE: B Natriuretic Peptide: 43.1 pg/mL (ref 0.0–100.0)

## 2021-06-13 MED ORDER — SERTRALINE HCL 50 MG PO TABS
50.0000 mg | ORAL_TABLET | Freq: Every day | ORAL | 3 refills | Status: DC
  Filled 2021-06-13: qty 90, 90d supply, fill #0
  Filled 2021-08-06 – 2021-10-07 (×3): qty 90, 90d supply, fill #1

## 2021-06-13 MED ORDER — FUROSEMIDE 20 MG PO TABS
20.0000 mg | ORAL_TABLET | Freq: Every day | ORAL | 3 refills | Status: DC
  Filled 2021-06-13: qty 30, 30d supply, fill #0
  Filled 2021-07-04 – 2021-08-06 (×2): qty 30, 30d supply, fill #1
  Filled 2021-10-07: qty 30, 30d supply, fill #2

## 2021-06-13 MED ORDER — GABAPENTIN 600 MG PO TABS
1800.0000 mg | ORAL_TABLET | Freq: Every day | ORAL | 1 refills | Status: DC
  Filled 2021-06-13: qty 90, 30d supply, fill #0
  Filled 2021-08-06: qty 90, 30d supply, fill #1

## 2021-06-13 NOTE — Assessment & Plan Note (Signed)
Uncontrolled. Grief reaction. Patient request Zoloft today as worked well for him in the past  Agreed to start Zoloft 50 mg with very close vigilance certainly the prior history of bipolar .

## 2021-06-13 NOTE — Patient Instructions (Addendum)
If you experience any recurrence of chest pain, I want you to call 911 immediately.  I have ordered a stat bilateral ultrasound to ensure you do not have a blood clot.  I have also ordered a D-dimer.  If either 1 of these were positive, I will call you today and advise emergency room.    I have also ordered a troponin, if this were to be positive this would be very concerning for heart attack, and I will call you once again and would advise emergency room

## 2021-06-13 NOTE — Progress Notes (Addendum)
Subjective:    Jose Merritt ID: Jose Merritt, male    DOB: 06-18-1975, 46 y.o.   MRN: [id number]  CC: Jose Merritt is a 46 y.o. male who presents today for follow up.   HPI: Complains of SOB x couple of weeks, unchanged  SOB with bending however describes that SOB is present all the time.  Bilateral legs swollen.  Complains of episode of right sided cp, occurred last night. Lasted for a couple of seconds. Occurred while reading in bed.  No left arm numbness , palpitations, fever, congestion, cough, orthopnea, nausea, ha, vision changes.    H/o PE.   Jose Merritt recently lost his best friend and Jose Merritt is grieving from this. Jose Merritt would like to restart zoloft as Jose Merritt has done well on this in the past.   Jose Merritt denies any thoughts of hurting himself or anyone else.   Jose Merritt is compliant with eliqus 2.5 mg once daily; Jose Merritt was unaware that it was prescribed twice daily  DM- last a1c 10.2 two months ago however Jose Merritt wasn't wearing his nsulin pump at that time. Jose Merritt feels blood sugar has improved.  FBG 190 on average, remain Less than 200. No hypoglycemic episodes.  Jose Merritt plans to schedule a follow up with Dr Tedd Sias.  Jose Merritt takes 1800 mg gabapentin nightly to help with pain in his legs and numbness that Jose Merritt is able to sleep.  Jose Merritt request refill of this medication  Last follow-up with cardiology 02/10/2021  HTN-compliant with metoprolol succinate 100mg , spironolactone 25mg , lisinopril 5mg   Coronary artery disease-compliant with Plavix, Xarelto as prescribed by cardiology.  Chronic combined systolic and diastolic CHF. Jose Merritt is not taking lasix  Hyperlipidemia-compliant with Zetia 10mg , lipitor 80mg  Echocardiogram 04/22/21 ejection fraction of 35 to 40%.  Consider changing lisinopril to [facility] and also SGLT2 inhibitor. HISTORY:  Past Medical History:  Diagnosis Date   Asthma    Bipolar disorder (HCC)    CHF (congestive heart failure) (HCC)    Chicken pox    Clotting disorder (HCC)    lung   Depression     Diabetes mellitus without complication (HCC) 2003   Type 2   Heart murmur    as child   Hyperlipidemia    Hypertension    Myocardial infarction (HCC) 06/2015   Pulmonary embolism (HCC) 2018   Retinopathy of both eyes 01/2016   SOB (shortness of breath) 08/17/2016   Stroke [facility])    Past Surgical History:  Procedure Laterality Date   CORONARY ANGIOPLASTY WITH STENT PLACEMENT Left 06/2015   LADx1 stent   WISDOM TOOTH EXTRACTION     Family History  Problem Relation Age of Onset   Hypertension Mother    Arthritis Mother    Heart disease Father        3 heart attacks   Diabetes Father    Hypertension Father    Hyperlipidemia Father    Alcohol abuse Father    Hypertension Brother    Hyperlipidemia Brother    Heart disease Maternal Uncle    Heart disease Paternal Aunt    Heart disease Maternal Grandfather    Hyperlipidemia Maternal Grandfather    Diabetes Maternal Grandfather    Kidney disease Maternal Grandfather    Heart disease Paternal Grandmother    Kidney disease Paternal Grandmother    Hypertension Maternal Grandmother    Kidney disease Maternal Grandmother    Arthritis Maternal Grandmother    Kidney disease Paternal Grandfather    Colon cancer Neg Hx  Allergies: Metrizamide, Other, Statins, Sulfa antibiotics, Sulfamethoxazole-trimethoprim, Sulfasalazine, and Contrast media [iodinated contrast media] Current Outpatient Medications on File Prior to Visit  Medication Sig Dispense Refill   atorvastatin (LIPITOR) 80 MG tablet Take 1 tablet (80 mg total) by mouth once daily 90 tablet 3   clopidogrel (PLAVIX) 75 MG tablet Take 1 tablet (75 mg total) by mouth daily. 90 tablet 3   ezetimibe (ZETIA) 10 MG tablet Take 1 tablet (10 mg total) by mouth once daily 90 tablet 3   insulin lispro (HUMALOG) 100 UNIT/ML injection Take up to 150 units daily via insulin pump 140 mL 3   lisinopril (ZESTRIL) 5 MG tablet Take 1 tablet (5 mg total) by mouth once daily 90 tablet 3    metoprolol succinate (TOPROL-XL) 100 MG 24 hr tablet Take 1 tablet (100 mg total) by mouth nightly 90 tablet 3   nitroGLYCERIN (NITROSTAT) 0.3 MG SL tablet Place 1 tablet (0.3 mg total) under the tongue every 5 (five) minutes as needed. 25 tablet 3   rivaroxaban (XARELTO) 2.5 MG TABS tablet TAKE 1 TABLET BY MOUTH TWICE DAILY 180 tablet 3   spironolactone (ALDACTONE) 25 MG tablet TAKE 1/2 TABLET BY MOUTH DAILY EVERY MORNING 45 tablet 3   No current facility-administered medications on file prior to visit.    Social History   Tobacco Use   Smoking status: Never   Smokeless tobacco: Never  Vaping Use   Vaping Use: Never used  Substance Use Topics   Alcohol use: No    Comment: rare- 2x/year   Drug use: No    Review of Systems  Constitutional:  Negative for chills, diaphoresis and fever.  HENT:  Negative for congestion.   Respiratory:  Positive for shortness of breath. Negative for cough.   Cardiovascular:  Positive for chest pain and leg swelling. Negative for palpitations.  Gastrointestinal:  Negative for nausea and vomiting.  Neurological:  Negative for dizziness and headaches.     Objective:    BP 118/68 (BP Location: Left Arm, Jose Merritt Position: Sitting, Cuff Size: Large)    Pulse 81    Temp 98 F (36.7 C) (Oral)    Ht 6' (1.829 m)    Wt 252 lb 12.8 oz (114.7 kg)    SpO2 98%    BMI 34.29 kg/m  BP Readings from Last 3 Encounters:  06/13/21 118/68  02/10/21 118/74  11/12/20 134/85   Wt Readings from Last 3 Encounters:  06/13/21 252 lb 12.8 oz (114.7 kg)  02/10/21 251 lb 8 oz (114.1 kg)  11/12/20 235 lb (106.6 kg)    Physical Exam Vitals reviewed.  Constitutional:      Appearance: Jose Merritt is well-developed.  Cardiovascular:     Rate and Rhythm: Regular rhythm.     Heart sounds: Normal heart sounds.     Comments: +1 bilateral nonpitting edema No  palpable cords or masses. No asymmetry in calf size when compared bilaterally   Pulmonary:     Effort: Pulmonary effort is  normal. No respiratory distress.     Breath sounds: Normal breath sounds. No wheezing, rhonchi or rales.  Skin:    General: Skin is warm and dry.  Neurological:     Mental Status: Jose Merritt is alert.  Psychiatric:        Speech: Speech normal.        Behavior: Behavior normal.       Assessment & Plan:   Problem List Items Addressed This Visit       Cardiovascular  and Mediastinum   Heart failure (HCC)   HTN (hypertension)    Blood pressure overall controlled.  Continue metoprolol succinate 100mg , spironolactone 25mg , lisinopril 5mg .  Dr. has discussed Entresto with Jose Merritt in the past.  We will arrange follow-up for Jose Merritt to optimize medications in the setting of CHF .         Endocrine   Type 2 diabetes mellitus with circulatory disorder (HCC)    Uncontrolled.  However I anticipate improved based on recent fasting blood glucose.  Jose Merritt will continue to follow with Dr. .  I have refilled gabapentin 80 mg nightly for peripheral neuropathy.  Of note: we discussed starting Jardiance to be appropriate in the setting of CHF.  We will discuss this again at follow-up  in 2 weeks as today we focused on SOB.         Other   Bipolar disorder, in partial remission, most recent episode depressed (HCC)    Uncontrolled. Grief reaction. Jose Merritt request Zoloft today as worked well for him in the past  Agreed to start Zoloft 50 mg with very close vigilance certainly the prior history of bipolar .      Relevant Medications   sertraline (ZOLOFT) 50 MG tablet   Chest pain    Presentation atypical as occurred right sided at rest last night. No CP today.  EKG shows SR with prior q wave in lead III, ST elevation V2, V3. This is unchanged from prior EKG 02/10/21. Troponin negative today  Strict ED precautions given to Jose Merritt due to h/o prior MI and risk factors. Jose Merritt verbalized understanding of all.  Case and EKG reviewed with supervising, Dr Mariah Milling, and she and I jointly agreed on management  plan.        SOB (shortness of breath) - Primary    No acute respiratory distress.   HR 81.  Bilateral edema on exam . concern for SOB, DOE as evidence of fluid volume overload.  Negative d dimer, bilateral venous ultrasound negative DVT. His weight has not tremendously when compared to cardiology appointment 4 months ago. However Jose Merritt reports his dry weight closer to 235 lbs whereas today is 252 lbs which is alarming.    BNP normal.  Pending chest x-ray at this time.  Start Lasix 20 mg with repeat BMP in 1 week.  Discussed starting jardiance and opted to wait until after lasix initiated. Follow-up appointment will be scheduled with Dr. Tedd Sias to discussed entresto.       Relevant Orders   EKG 12-Lead (Completed)   CBC with Differential/Platelet (Completed)   Comprehensive metabolic panel (Completed)   Microalbumin / creatinine urine ratio   Troponin I (High Sensitivity) (Completed)   04/12/21 Venous Img Lower Bilateral (DVT) (Completed)   D-Dimer, Quantitative (Completed)   Brain natriuretic peptide (Completed)   Other Visit Diagnoses     Type 2 diabetes mellitus with diabetic neuropathy, with long-term current use of insulin (HCC)       Relevant Medications   gabapentin (NEURONTIN) 600 MG tablet        I have discontinued Duncan Dull. Culliver's Insulin Pen Needle, insulin glargine, INS SYRINGE/NEEDLE 1CC/28G, and gabapentin. I have also changed his gabapentin. Additionally, I am having him start on sertraline. Lastly, I am having him maintain his metoprolol succinate, insulin lispro, atorvastatin, clopidogrel, ezetimibe, lisinopril, nitroGLYCERIN, rivaroxaban, and spironolactone.   Meds ordered this encounter  Medications   gabapentin (NEURONTIN) 600 MG tablet    Sig: Take 3 tablets (1,800 mg  total) by mouth at bedtime.    Dispense:  90 tablet    Refill:  1    Order Specific Question:   Supervising Provider    Answer:   Duncan Dull L [[id number]]   sertraline (ZOLOFT) 50 MG tablet    Sig:  Take 1 tablet (50 mg total) by mouth at bedtime.    Dispense:  90 tablet    Refill:  3    Order Specific Question:   Supervising Provider    Answer:   Sherlene Shams [[id number]]    Return precautions given.   Risks, benefits, and alternatives of the medications and treatment plan prescribed today were discussed, and Jose Merritt expressed understanding.   Education regarding symptom management and diagnosis given to Jose Merritt on AVS.  Continue to follow with Allegra Grana, FNP for routine health maintenance.   Dixie Dials and I agreed with plan.   [name], FNP  I have spent 40 minutes with a Jose Merritt including precharting, exam, reviewing medical records, and discussion plan of care.

## 2021-06-13 NOTE — Assessment & Plan Note (Signed)
Blood pressure overall controlled.  Continue metoprolol succinate 100mg , spironolactone 25mg , lisinopril 5mg .  Dr. has discussed with patient in the past.  We will arrange follow-up for patient to optimize medications in the setting of CHF .

## 2021-06-13 NOTE — Assessment & Plan Note (Addendum)
No acute respiratory distress.   HR 81.  Bilateral edema on exam . concern for SOB, DOE as evidence of fluid volume overload.  Negative d dimer, bilateral venous ultrasound negative DVT. His weight has not tremendously when compared to cardiology appointment 4 months ago. However he reports his dry weight closer to 235 lbs whereas today is 252 lbs which is alarming.    BNP normal.  Pending chest x-ray at this time.  Start Lasix 20 mg with repeat BMP in 1 week.  Discussed starting jardiance and opted to wait until after lasix initiated. Follow-up appointment will be scheduled with Dr. Mariah Milling to discussed entresto.

## 2021-06-13 NOTE — Assessment & Plan Note (Signed)
Presentation atypical as occurred right sided at rest last night. No CP today.  EKG shows SR with prior q wave in lead III, ST elevation V2, V3. This is unchanged from prior EKG 02/10/21. Troponin negative today  Strict ED precautions given to patient due to h/o prior MI and risk factors. He verbalized understanding of all.  Case and EKG reviewed with supervising, Dr Duncan Dull, and she and I jointly agreed on management plan.

## 2021-06-13 NOTE — Assessment & Plan Note (Addendum)
Uncontrolled.  However I anticipate improved based on recent fasting blood glucose.  He will continue to follow with Dr. Tedd Sias.  I have refilled gabapentin 80 mg nightly for peripheral neuropathy.  Of note: we discussed starting Jardiance to be appropriate in the setting of CHF.  We will discuss this again at follow-up  in 2 weeks as today we focused on SOB.

## 2021-06-15 LAB — MICROALBUMIN / CREATININE URINE RATIO
Creatinine, Urine: 69.5 mg/dL
Microalb Creat Ratio: 443 mg/g creat — ABNORMAL HIGH (ref 0–29)
Microalb, Ur: 307.7 ug/mL — ABNORMAL HIGH

## 2021-06-18 ENCOUNTER — Ambulatory Visit
Admission: RE | Admit: 2021-06-18 | Discharge: 2021-06-18 | Disposition: A | Discharge status: Home / Self Care | Payer: 59 | Source: Ambulatory Visit | Attending: Family | Admitting: Family

## 2021-06-18 ENCOUNTER — Encounter: Payer: Self-pay | Admitting: Family

## 2021-06-18 DIAGNOSIS — R0602 Shortness of breath: Secondary | ICD-10-CM | POA: Insufficient documentation

## 2021-06-20 ENCOUNTER — Other Ambulatory Visit (INDEPENDENT_AMBULATORY_CARE_PROVIDER_SITE_OTHER): Payer: 59

## 2021-06-20 ENCOUNTER — Other Ambulatory Visit: Payer: Self-pay

## 2021-06-20 DIAGNOSIS — R0602 Shortness of breath: Secondary | ICD-10-CM

## 2021-06-20 DIAGNOSIS — I5042 Chronic combined systolic (congestive) and diastolic (congestive) heart failure: Secondary | ICD-10-CM

## 2021-06-20 NOTE — Addendum Note (Signed)
Addended by: Akhila Mahnken S on: 06/20/2021 01:59 PM ° ° Modules accepted: Orders ° °

## 2021-06-21 LAB — BASIC METABOLIC PANEL
BUN/Creatinine Ratio: 28 (calc) — ABNORMAL HIGH (ref 6–22)
BUN: 26 mg/dL — ABNORMAL HIGH (ref 7–25)
CO2: 27 mmol/L (ref 20–32)
Calcium: 9.3 mg/dL (ref 8.6–10.3)
Chloride: 106 mmol/L (ref 98–110)
Creat: 0.93 mg/dL (ref 0.60–1.29)
Glucose, Bld: 63 mg/dL — ABNORMAL LOW (ref 65–99)
Potassium: 4.3 mmol/L (ref 3.5–5.3)
Sodium: 140 mmol/L (ref 135–146)

## 2021-06-24 ENCOUNTER — Telehealth: Payer: Self-pay

## 2021-06-24 NOTE — Telephone Encounter (Signed)
I did call and speak with patient about his symptoms. I advised that if increasing SOB or chest pain given swelling that he needed to go to the ED even before he was seen tomorrow by Dr. Mariah Milling. Pt stated that he understood if symptoms worsened then he would go. I explained & stressed the importance due to his hx.

## 2021-06-25 ENCOUNTER — Ambulatory Visit: Payer: 59 | Admitting: Family

## 2021-06-25 ENCOUNTER — Ambulatory Visit: Payer: 59 | Admitting: Cardiovascular Disease

## 2021-06-25 ENCOUNTER — Other Ambulatory Visit: Payer: Self-pay

## 2021-06-25 ENCOUNTER — Encounter: Payer: Self-pay | Admitting: Cardiovascular Disease

## 2021-06-25 VITALS — BP 130/70 | HR 82 | Ht 72.0 in | Wt 255.5 lb

## 2021-06-25 DIAGNOSIS — I10 Essential (primary) hypertension: Secondary | ICD-10-CM

## 2021-06-25 DIAGNOSIS — I5042 Chronic combined systolic (congestive) and diastolic (congestive) heart failure: Secondary | ICD-10-CM

## 2021-06-25 DIAGNOSIS — I209 Angina pectoris, unspecified: Secondary | ICD-10-CM

## 2021-06-25 DIAGNOSIS — I25118 Atherosclerotic heart disease of native coronary artery with other forms of angina pectoris: Secondary | ICD-10-CM | POA: Diagnosis not present

## 2021-06-25 DIAGNOSIS — R0602 Shortness of breath: Secondary | ICD-10-CM

## 2021-06-25 DIAGNOSIS — R072 Precordial pain: Secondary | ICD-10-CM

## 2021-06-25 DIAGNOSIS — E782 Mixed hyperlipidemia: Secondary | ICD-10-CM | POA: Diagnosis not present

## 2021-06-25 DIAGNOSIS — E1159 Type 2 diabetes mellitus with other circulatory complications: Secondary | ICD-10-CM

## 2021-06-25 MED ORDER — ENTRESTO 24-26 MG PO TABS
1.0000 | ORAL_TABLET | Freq: Two times a day (BID) | ORAL | 6 refills | Status: DC
  Filled 2021-06-25: qty 60, 30d supply, fill #0
  Filled 2021-08-06: qty 60, 30d supply, fill #1

## 2021-06-25 NOTE — Telephone Encounter (Signed)
noted 

## 2021-06-25 NOTE — Progress Notes (Signed)
Cardiology Office Note  Date:  06/25/2021   ID:  Delmos, Velaquez 1976-02-24, MRN [id number]  PCP:  Allegra Grana, FNP   Chief Complaint  Patient presents with   Shortness of Breath    Patient c/o shortness of breath and chest pain with activity. Medications reviewed by the patient verbally.    HPI:  Mr. Googe is a pleasant 46 year old gentleman with history of CAD, STEMI Stent to LAD 06/2015, initially presented with shoulder pain neck pain, shortness of breath/anginal equivalent DM II, poorly controlled Bipolar/depression HTN Hyperlipidemia Obesity TIA x2 in 2018 Hx of PE Initial ejection fraction down to 20% in early 2017 after STEMI, Treated with warfarin for LV thrombus 6 months EF Up to 40% on follow-up echo 2017 Who presents for management of his coronary disease  Last seen in clinic February 10, 2021  In follow-up today reports having worsening shortness of breath over the past 3 weeks Shortness of breath rolling around in bed, short of breath walking any distance Etiology unclear No improvement in symptoms with taking Lasix, denies ankle swelling Recent lab work reviewed, low BNP, troponin negative, D-dimer negative, normal CBC and CMP  No regular exercise program  Echo 11/22 reviewed in detail Ejection fraction 35 to 40%, appears unchanged from 2017 No significant valve disease noted  He has been doing better with his glucose levels Drastic drop in glucose A1c of 10 2 months ago Reports sugars of 100-150 when he checks it  EKG personally reviewed by myself on todays visit Shows normal sinus rhythm rate 82 bpm old anterior MI, unable to exclude old inferior MI   Other past medical history reviewed Tia x2 November 2018 reports that he was Getting an ice cream, Started complaining of arm pain, people reported he was talking off topic, Left arm, left shoulder numb He went to the hospital, [facility] CT head ok MRI ok Recent carotid  ultrasound with no significant disease  Earlier in 2018 also reported having similar symptoms Went to hospital in [place], results not available   left arm numb again No speech problem that time  Details not clear but reports that he was given a medication at the time that he had significant allergy to  Admitted 1/31-07/11/2015 for STEMI. 1 week prior to presentation he had left shoulder pain   treated at urgent care for bursitis with a steroid injection.   In the few days prior to presentation,   EKG with ST elevation. Cath with occluded mid LAD treated with DES. No residual disease.   Echo with ejection fraction 20% with probable LV thrombus. Started on warfarin. Metoprolol succinate, atorvastatin and lisinopril also initiated.   08/29/15 Repeat Cardiac cath, stable , stents patent Echocardiogram with improved ejection fraction (40%) and resolution of LV apical thrombus. Lifevest stopped Warfarin stopped, but he continued to take   followed by endocrinologist, Dr Vincente Liberty with DM nutritionist Bipolar-seen psychiatrist;   Previous echocardiogram March 2017, ejection fraction 40%, up from 19%   PMH:   has a past medical history of Asthma, Bipolar disorder (HCC), CHF (congestive heart failure) (HCC), Chicken pox, Clotting disorder (HCC), Depression, Diabetes mellitus without complication (HCC) (2003), Heart murmur, Hyperlipidemia, Hypertension, Myocardial infarction (HCC) (06/2015), Pulmonary embolism (HCC) (2018), Retinopathy of both eyes (01/2016), SOB (shortness of breath) (08/17/2016), and Stroke (HCC).  PSH:    Past Surgical History:  Procedure Laterality Date   CORONARY ANGIOPLASTY WITH STENT PLACEMENT Left 06/2015   LADx1 stent   WISDOM TOOTH EXTRACTION  Current Outpatient Medications  Medication Sig Dispense Refill   atorvastatin (LIPITOR) 80 MG tablet Take 1 tablet (80 mg total) by mouth once daily 90 tablet 3   clopidogrel (PLAVIX) 75 MG tablet Take 1  tablet (75 mg total) by mouth daily. 90 tablet 3   ezetimibe (ZETIA) 10 MG tablet Take 1 tablet (10 mg total) by mouth once daily 90 tablet 3   furosemide (LASIX) 20 MG tablet Take 1 tablet (20 mg total) by mouth daily. 30 tablet 3   gabapentin (NEURONTIN) 600 MG tablet Take 3 tablets (1,800 mg total) by mouth at bedtime. 90 tablet 1   insulin lispro (HUMALOG) 100 UNIT/ML injection Take up to 150 units daily via insulin pump 140 mL 3   lisinopril (ZESTRIL) 5 MG tablet Take 1 tablet (5 mg total) by mouth once daily 90 tablet 3   metoprolol succinate (TOPROL-XL) 100 MG 24 hr tablet Take 1 tablet (100 mg total) by mouth nightly 90 tablet 3   nitroGLYCERIN (NITROSTAT) 0.3 MG SL tablet Place 1 tablet (0.3 mg total) under the tongue every 5 (five) minutes as needed. 25 tablet 3   rivaroxaban (XARELTO) 2.5 MG TABS tablet TAKE 1 TABLET BY MOUTH TWICE DAILY 180 tablet 3   sertraline (ZOLOFT) 50 MG tablet Take 1 tablet (50 mg total) by mouth at bedtime. 90 tablet 3   spironolactone (ALDACTONE) 25 MG tablet TAKE 1/2 TABLET BY MOUTH DAILY EVERY MORNING 45 tablet 3   No current facility-administered medications for this visit.     Allergies:   Metrizamide, Other, Statins, Sulfa antibiotics, Sulfamethoxazole-trimethoprim, Sulfasalazine, and Contrast media [iodinated contrast media]   Social History:  The patient  reports that he has never smoked. He has never used smokeless tobacco. He reports that he does not drink alcohol and does not use drugs.   Family History:   family history includes Alcohol abuse in his father; Arthritis in his maternal grandmother and mother; Diabetes in his father and maternal grandfather; Heart disease in his father, maternal grandfather, maternal uncle, paternal aunt, and paternal grandmother; Hyperlipidemia in his brother, father, and maternal grandfather; Hypertension in his brother, father, maternal grandmother, and mother; Kidney disease in his maternal grandfather, maternal  grandmother, paternal grandfather, and paternal grandmother.    Review of Systems: Review of Systems  Constitutional: Negative.   HENT: Negative.    Respiratory:  Positive for shortness of breath.   Cardiovascular: Negative.   Gastrointestinal: Negative.   Musculoskeletal: Negative.   Neurological: Negative.   Psychiatric/Behavioral: Negative.    All other systems reviewed and are negative.  PHYSICAL EXAM: VS:  BP 130/70 (BP Location: Left Arm, Patient Position: Sitting, Cuff Size: Normal)    Pulse 82    Ht 6' (1.829 m)    Wt 255 lb 8 oz (115.9 kg)    SpO2 98%    BMI 34.65 kg/m  , BMI Body mass index is 34.65 kg/m. Constitutional:  oriented to person, place, and time. No distress.  HENT:  Head: Grossly normal Eyes:  no discharge. No scleral icterus.  Neck: No JVD, no carotid bruits  Cardiovascular: Regular rate and rhythm, no murmurs appreciated Pulmonary/Chest: Clear to auscultation bilaterally, no wheezes or rails Abdominal: Soft.  no distension.  no tenderness.  Musculoskeletal: Normal range of motion Neurological:  normal muscle tone. Coordination normal. No atrophy Skin: Skin warm and dry Psychiatric: normal affect, pleasant  Recent Labs: 11/04/2020: TSH 3.98 06/13/2021: ALT 23; B Natriuretic Peptide 43.1; Hemoglobin 14.4; Platelets 222 06/20/2021: BUN  26; Creat 0.93; Potassium 4.3; Sodium 140    Lipid Panel Lab Results  Component Value Date   CHOL 115 11/04/2020   HDL 40 11/04/2020   LDLCALC 54 11/04/2020   TRIG 126 11/04/2020      Wt Readings from Last 3 Encounters:  06/25/21 255 lb 8 oz (115.9 kg)  06/13/21 252 lb 12.8 oz (114.7 kg)  02/10/21 251 lb 8 oz (114.1 kg)      ASSESSMENT AND PLAN:  Coronary artery disease of native artery of native heart with stable angina pectoris (HCC) -  Tolerating Plavix and Xarelto 2.5 mg po BID -Has had improvement in his glucose levels, A1c 2 months ago still elevated but feels it is improving Reports worsening  shortness of breath tightness, unable to do anything, concerning for angina Recommend Lexiscan Myoview to rule out ischemia Medication changes as below  Cardiomyopathy, ischemic Discussed medications that might assist with cardiomyopathy and shortness of breath symptoms Recommend holding lisinopril, wait 3 days then start low-dose Entresto At a later date consider adding Jardiance/Farxiga  Mixed hyperlipidemia Continue Zetia and Lipitor Cholesterol at goal  TIA He was evaluated in the hospital twice for TIA symptoms Continue Plavix and Xarelto 2.5 twice daily No further episodes  Type 2 diabetes mellitus with other circulatory complication, unspecified whether long term insulin use (HCC) A1c high  There has been significant improvement in numbers Diet restriction recommended  Bipolar disorder, in partial remission, most recent episode depressed (HCC) Stable managed by primary care  Chronic combined systolic and diastolic CHF (congestive heart failure) (HCC)  Ejection fraction 35-40% March 2017 Recent ejection fraction 35 to 40% Continue metoprolol succinate, spironolactone, we will add Entresto, stop the lisinopril as detailed above In follow-up visit consider adding Jardiance/Farxiga    Total encounter time more than 35 minutes  Greater than 50% was spent in counseling and coordination of care with the patient    No orders of the defined types were placed in this encounter.     Signed, Dossie Arbour, M.D., Ph.D. 06/25/2021  [facility] [place], [place] [phone]

## 2021-06-25 NOTE — Patient Instructions (Addendum)
Medication Instructions:  - Your physician has recommended you make the following change in your medication:   1) STOP lisinopril  2) In 3 days, START entresto 24/26 mg: - take 1 tablet by mouth twice a day  3) Read about farxiga/jardiance   Samples Given: Entresto 24/26 mg Lot: [id number] Exp: 03/2023 # 1 bottle   If you need a refill on your cardiac medications before your next appointment, please call your pharmacy.    Lab work: No new labs needed   Testing/Procedures: Your physician has requested that you have a lexiscan myoview (chemical stress test/ cardiac nuclear scan).   [facility] MYOVIEW  Your caregiver has ordered a Stress Test with nuclear imaging. The purpose of this test is to evaluate the blood supply to your heart muscle. This procedure is referred to as a "Non-Invasive Stress Test." This is because other than having an IV started in your vein, nothing is inserted or "invades" your body. Cardiac stress tests are done to find areas of poor blood flow to the heart by determining the extent of coronary artery disease (CAD). Some patients exercise on a treadmill, which naturally increases the blood flow to your heart, while others who are  unable to walk on a treadmill due to physical limitations have a pharmacologic/chemical stress agent called Lexiscan . This medicine will mimic walking on a treadmill by temporarily increasing your coronary blood flow.   Please note: these test may take anywhere between 2-4 hours to complete  PLEASE REPORT TO [facility] MEDICAL MALL ENTRANCE  THE VOLUNTEERS AT THE FIRST DESK WILL DIRECT YOU WHERE TO GO  Date of Procedure:_____________________________________  Arrival Time for Procedure:______________________________  Instructions regarding medication:   __x__ : Hold diabetes medication (all oral/ insulin) the morning of your test  __x__:  Hold lasix (furosemide) the morning of your test  ___x__: Bonita Quin may take all of your other regular  morning medications not listed above the morning of your test with enough water to get them down safely  PLEASE NOTIFY THE OFFICE AT LEAST 24 HOURS IN ADVANCE IF YOU ARE UNABLE TO KEEP YOUR APPOINTMENT.  [phone] AND  PLEASE NOTIFY NUCLEAR MEDICINE AT [facility] AT LEAST 24 HOURS IN ADVANCE IF YOU ARE UNABLE TO KEEP YOUR APPOINTMENT. [phone]  How to prepare for your Myoview test:  Do not eat or drink after midnight No caffeine for 24 hours prior to test No smoking 24 hours prior to test. Your medication may be taken with water.  If your doctor stopped a medication because of this test, do not take that medication. Ladies, please do not wear dresses.  Skirts or pants are appropriate. Please wear a short sleeve shirt. No perfume, cologne or lotion. Wear comfortable walking shoes. No heels!   Follow-Up: At [facility], you and your health needs are our priority.  As part of our continuing mission to provide you with exceptional heart care, we have created designated Provider Care Teams.  These Care Teams include your primary Cardiologist (physician) and Advanced Practice Providers (APPs -  Physician Assistants and Nurse Practitioners) who all work together to provide you with the care you need, when you need it.  You will need a follow up appointment in 6 months/ or sooner depending of test results/ symptoms  Providers on your designated Care Team:   Nicolasa Ducking, NP Eula Listen, PA-C Cadence Fransico Michael, PA-C  COVID-19 Vaccine Information can be found at: [email or web address] For questions related to vaccine distribution or appointments, please email [email or web address] or  call [phone].    Cardiac Nuclear Scan A cardiac nuclear scan is a test that is done to check the flow of blood to your heart. It is done when you are resting and when you are exercising. The test looks for problems such as: Not enough blood reaching a  portion of the heart. The heart muscle not working as it should. You may need this test if: You have heart disease. You have had lab results that are not normal. You have had heart surgery or a balloon procedure to open up blocked arteries (angioplasty). You have chest pain. You have shortness of breath. In this test, a special dye (tracer) is put into your bloodstream. The tracer will travel to your heart. A camera will then take pictures of your heart to see how the tracer moves through your heart. This test is usually done at a hospital and takes 2-4 hours. Tell a doctor about: Any allergies you have. All medicines you are taking, including vitamins, herbs, eye drops, creams, and over-the-counter medicines. Any problems you or family members have had with anesthetic medicines. Any blood disorders you have. Any surgeries you have had. Any medical conditions you have. Whether you are pregnant or may be pregnant. What are the risks? Generally, this is a safe test. However, problems may occur, such as: Serious chest pain and heart attack. This is only a risk if the stress portion of the test is done. Rapid heartbeat. A feeling of warmth in your chest. This feeling usually does not last long. Allergic reaction to the tracer. What happens before the test? Ask your doctor about changing or stopping your normal medicines. This is important. Follow instructions from your doctor about what you cannot eat or drink. Remove your jewelry on the day of the test. What happens during the test? An IV tube will be inserted into one of your veins. Your doctor will give you a small amount of tracer through the IV tube. You will wait for 20-40 minutes while the tracer moves through your bloodstream. Your heart will be monitored with an electrocardiogram (ECG). You will lie down on an exam table. Pictures of your heart will be taken for about 15-20 minutes. You may also have a stress test. For this  test, one of these things may be done: You will be asked to exercise on a treadmill or a stationary bike. You will be given medicines that will make your heart work harder. This is done if you are unable to exercise. When blood flow to your heart has peaked, a tracer will again be given through the IV tube. After 20-40 minutes, you will get back on the exam table. More pictures will be taken of your heart. Depending on the tracer that is used, more pictures may need to be taken 3-4 hours later. Your IV tube will be removed when the test is over. The test may vary among doctors and hospitals. What happens after the test? Ask your doctor: Whether you can return to your normal schedule, including diet, activities, and medicines. Whether you should drink more fluids. This will help to remove the tracer from your body. Drink enough fluid to keep your pee (urine) pale yellow. Ask your doctor, or the department that is doing the test: When will my results be ready? How will I get my results? Summary A cardiac nuclear scan is a test that is done to check the flow of blood to your heart. Tell your doctor whether you are  pregnant or may be pregnant. Before the test, ask your doctor about changing or stopping your normal medicines. This is important. Ask your doctor whether you can return to your normal activities. You may be asked to drink more fluids. This information is not intended to replace advice given to you by your health care provider. Make sure you discuss any questions you have with your health care provider. Document Revised: 01/29/2021 Document Reviewed: 10/30/2020 Elsevier Patient Education  2022 Elsevier Inc.   ENTRESTO (Sacubitril; Valsartan) Oral Tablets What is this medication? SACUBITRIL; VALSARTAN (sak UE bi tril; val SAR tan) is a combination of a neprilysin inhibitor and a an angiotensin II receptor blocker. It treats heart failure. This medicine may be used for other  purposes; ask your health care provider or pharmacist if you have questions. COMMON BRAND NAME(S): Entresto What should I tell my care team before I take this medication? They need to know if you have any of these conditions: diabetes and take a medicine that contains aliskiren high levels of potassium in the blood kidney disease liver disease low blood pressure an unusual or allergic reaction to sacubitril; valsartan, drugs called angiotensin converting enzyme (ACE) inhibitors, angiotensin II receptor blockers (ARBs), other medicines, foods, dyes, or preservatives pregnant or trying to get pregnant breast-feeding How should I use this medication? Take this medicine by mouth. Take it as directed on the prescription label at the same time every day. You can take it with or without food. If it upsets your stomach, take it with food. Keep taking it unless your health care provider tells you to stop. Talk to your health care provider about the use of this drug in children. While it may be prescribed for children as young as 1 for selected conditions, precautions do apply. Overdosage: If you think you have taken too much of this medicine contact a poison control center or emergency room at once. NOTE: This medicine is only for you. Do not share this medicine with others. What if I miss a dose? If you miss a dose, take it as soon as you can. If it is almost time for your next dose, take only that dose. Do not take double or extra doses. What may interact with this medication? Do not take this medicine with any of the following medicines: aliskiren if you have diabetes angiotensin-converting enzyme (ACE) inhibitors, like benazepril, captopril, enalapril, fosinopril, lisinopril, or ramipril tranylcypromine This medicine may also interact with the following medicines: angiotensin II receptor blockers (ARBs) like azilsartan, candesartan, eprosartan, irbesartan, losartan, olmesartan, telmisartan, or  valsartan celecoxib lithium NSAIDS, medicines for pain and inflammation, like ibuprofen or naproxen potassium-sparing diuretics like amiloride, spironolactone, and triamterene potassium supplements This list may not describe all possible interactions. Give your health care provider a list of all the medicines, herbs, non-prescription drugs, or dietary supplements you use. Also tell them if you smoke, drink alcohol, or use illegal drugs. Some items may interact with your medicine. What should I watch for while using this medication? Tell your doctor or health care provider if your symptoms do not start to get better or if they get worse. Do not become pregnant while taking this medicine. Women should inform their health care provider if they wish to become pregnant or think they might be pregnant. There is a potential for serious harm to an unborn child. Talk to your health care provider for more information. You may get drowsy or dizzy. Do not drive, use machinery, or do anything that  needs mental alertness until you know how this medicine affects you. Do not stand or sit up quickly, especially if you are an older patient. This reduces the risk of dizzy or fainting spells. Alcohol may interfere with the effects of this medicine. Avoid alcoholic drinks. Avoid salt substitutes unless you are told otherwise by your health care provider. What side effects may I notice from receiving this medication? Side effects that you should report to your doctor or health care provider as soon as possible: allergic reactions (skin rash, itching or hives; swelling of the face, lips, or tongue) high potassium levels (chest pain; fast, irregular heartbeat; muscle weakness) kidney injury (trouble passing urine or change in the amount of urine) low blood pressure (dizziness; feeling faint or lightheaded, falls; unusually weak or tired) Side effects that usually do not require medical attention (report to your doctor or  health care provider if they continue or are bothersome): cough This list may not describe all possible side effects. Call your doctor for medical advice about side effects. You may report side effects to FDA at 1-800-FDA-1088. Where should I keep my medication? Keep out of the reach of children and pets. Store at room temperature between 20 and 25 degrees C (68 and 77 degrees F). Protect from moisture. Keep the container tightly closed. Get rid of any unused medicine after the expiration date. To get rid of medicines that are no longer needed or have expired: Take the medicine to a take-back program. Check with your pharmacy or law enforcement to find a location. If you cannot return the medicine, check the label or package insert to see if the medicine should be thrown out in the garbage or flushed down the toilet. If you are not sure, ask your health care provider. If it is safe to put it in the trash, empty the medicine out of the container. Mix the medicine with cat litter, dirt, coffee grounds, or other unwanted substance. Seal the mixture in a bag or container. Put it in the trash. NOTE: This sheet is a summary. It may not cover all possible information. If you have questions about this medicine, talk to your doctor, pharmacist, or health care provider.  2022 Elsevier/Gold Standard (2021-02-04 00:00:00)   FARXIGA (Dapagliflozin) Tablets What is this medication? DAPAGLIFLOZIN (DAP a gli FLOE zin) treats type 2 diabetes. It works by helping your kidneys remove sugar (glucose) from your blood through the urine, which decreases your blood sugar. It can also be used to lower the risk of heart attack, stroke, kidney disease, and hospitalization for heart failure in people with type 2 diabetes. Changes to diet and exercise are often combined with this medication. This medicine may be used for other purposes; ask your health care provider or pharmacist if you have questions. COMMON BRAND NAME(S):  Marcelline Deist What should I tell my care team before I take this medication? They need to know if you have any of these conditions: Dehydration Diabetic ketoacidosis Diet low in salt Eating less due to illness, surgery, dieting, or any other reason Having surgery History of pancreatitis or pancreas problems History of yeast infection of the penis or vagina If you often drink alcohol Infection in the bladder, kidneys, or urinary tract Kidney disease Low blood pressure On dialysis Problems urinating Type 1 diabetes Uncircumcised male An unusual or allergic reaction to dapagliflozin, other medications, foods, dyes, or preservatives Pregnant or trying to get pregnant Breast-feeding How should I use this medication? Take this medication by mouth with  water. Take it as directed on the prescription label at the same time every day. You can take it with or without food. If it upsets your stomach, take it with food. Keep taking it unless your care team tells you to stop. A special MedGuide will be given to you by the pharmacist with each prescription and refill. Be sure to read this information carefully each time. Talk to your care team about the use of this medication in children. Special care may be needed. Overdosage: If you think you have taken too much of this medicine contact a poison control center or emergency room at once. NOTE: This medicine is only for you. Do not share this medicine with others. What if I miss a dose? If you miss a dose, take it as soon as you can. If it is almost time for your next dose, take only that dose. Do not take double or extra doses. What may interact with this medication? Interactions are not expected. This list may not describe all possible interactions. Give your health care provider a list of all the medicines, herbs, non-prescription drugs, or dietary supplements you use. Also tell them if you smoke, drink alcohol, or use illegal drugs. Some items may  interact with your medicine. What should I watch for while using this medication? Visit your care team for regular checks on your progress. Tell your care team if your symptoms do not start to get better or if they get worse. This medication can cause a serious condition in which there is too much acid in the blood. If you develop nausea, vomiting, stomach pain, unusual tiredness, or breathing problems, stop taking this medication and call your care team right away. If possible, use a ketone dipstick to check for ketones in your urine. Check with your care team if you have severe diarrhea, nausea, and vomiting, or if you sweat a lot. The loss of too much body fluid may make it dangerous for you to take this medication. A test called the HbA1C (A1C) will be monitored. This is a simple blood test. It measures your blood sugar control over the last 2 to 3 months. You will receive this test every 3 to 6 months. Learn how to check your blood sugar. Learn the symptoms of low and high blood sugar and how to manage them. Always carry a quick-source of sugar with you in case you have symptoms of low blood sugar. Examples include hard sugar candy or glucose tablets. Make sure others know that you can choke if you eat or drink when you develop serious symptoms of low blood sugar, such as seizures or unconsciousness. Get medical help at once. Tell your care team if you have high blood sugar. You might need to change the dose of your medication. If you are sick or exercising more than usual, you may need to change the dose of your medication. Do not skip meals. Ask your care team if you should avoid alcohol. Many nonprescription cough and cold products contain sugar or alcohol. These can affect blood sugar. Wear a medical ID bracelet or chain. Carry a card that describes your condition. List the medications and doses you take on the card. What side effects may I notice from receiving this medication? Side effects  that you should report to your care team as soon as possible: Allergic reactions--skin rash, itching, hives, swelling of the face, lips, tongue, or throat Dehydration--increased thirst, dry mouth, feeling faint or lightheaded, headache, dark yellow or  brown urine Diabetic ketoacidosis (DKA)--increased thirst or amount of urine, dry mouth, fatigue, fruity odor to breath, trouble breathing, stomach pain, nausea, vomiting Genital yeast infection--redness, swelling, pain, or itchiness, odor, thick or lumpy discharge New pain or tenderness, change in skin color, sores or ulcers, infection of the leg or foot Infection or redness, swelling, tenderness, or pain in the genitals, or area from the genitals to the back of the rectum Urinary tract infection (UTI)--burning when passing urine, passing frequent small amounts of urine, bloody or cloudy urine, pain in the lower back or sides This list may not describe all possible side effects. Call your doctor for medical advice about side effects. You may report side effects to FDA at 1-800-FDA-1088. Where should I keep my medication? Keep out of the reach of children and pets. Store at room temperature between 20 and 25 degrees C (68 and 77 degrees F). Get rid of any unused medication after the expiration date. To get rid of medications that are no longer needed or have expired: Take the medication to a medication take-back program. Check with your pharmacy or law enforcement to find a location. If you cannot return the medication, check the label or package insert to see if the medication should be thrown out in the garbage or flushed down the toilet. If you are not sure, ask your care team. If it is safe to put it in the trash, take the medication out of the container. Mix the medication with cat litter, dirt, coffee grounds, or other unwanted substance. Seal the mixture in a bag or container. Put it in the trash. NOTE: This sheet is a summary. It may not cover  all possible information. If you have questions about this medicine, talk to your doctor, pharmacist, or health care provider.  2022 Elsevier/Gold Standard (2020-08-22 00:00:00)   JARDIANC (Empagliflozin) Tablets What is this medication? EMPAGLIFLOZIN (EM pa gli FLOE zin) treats type 2 diabetes. It works by helping your kidneys remove sugar (glucose) from your blood through the urine, which decreases your blood sugar. It can also be used to lower the risk of heart attack, stroke, and hospitalization for heart failure in people with type 2 diabetes. Changes to diet and exercise are often combined with this medication. This medicine may be used for other purposes; ask your health care provider or pharmacist if you have questions. COMMON BRAND NAME(S): Jardiance What should I tell my care team before I take this medication? They need to know if you have any of these conditions: Dehydration Diabetic ketoacidosis Diet low in salt Eating less due to illness, surgery, dieting, or any other reason Having surgery High cholesterol High levels of potassium in the blood History of pancreatitis or pancreas problems History of yeast infection of the penis or vagina If you often drink alcohol Infections in the bladder, kidneys, or urinary tract Kidney disease Liver disease Low blood pressure On hemodialysis Problems urinating Type 1 diabetes Uncircumcised male An unusual or allergic reaction to empagliflozin, other medications, foods, dyes, or preservatives Pregnant or trying to get pregnant Breast-feeding How should I use this medication? Take this medication by mouth with water. Take it as directed on the prescription label at the same time every day. You may take it with or without food. Keep taking it unless your care team tells you to stop. A special MedGuide will be given to you by the pharmacist with each prescription and refill. Be sure to read this information carefully each time. Talk  to  your care team about the use of this medication in children. Special care may be needed. Overdosage: If you think you have taken too much of this medicine contact a poison control center or emergency room at once. NOTE: This medicine is only for you. Do not share this medicine with others. What if I miss a dose? If you miss a dose, take it as soon as you can. If it is almost time for your next dose, take only that dose. Do not take double or extra doses. What may interact with this medication? Alcohol Diuretics Insulin This list may not describe all possible interactions. Give your health care provider a list of all the medicines, herbs, non-prescription drugs, or dietary supplements you use. Also tell them if you smoke, drink alcohol, or use illegal drugs. Some items may interact with your medicine. What should I watch for while using this medication? Visit your care team for regular checks on your progress. Tell your care team if your symptoms do not start to get better or if they get worse. This medication can cause a serious condition in which there is too much acid in the blood. If you develop nausea, vomiting, stomach pain, unusual tiredness, or breathing problems, stop taking this medication and call your care team right away. If possible, use a ketone dipstick to check for ketones in your urine. Check with your care team if you have severe diarrhea, nausea, and vomiting, or if you sweat a lot. The loss of too much body fluid may make it dangerous for you to take this medication. A test called the HbA1C (A1C) will be monitored. This is a simple blood test. It measures your blood sugar control over the last 2 to 3 months. You will receive this test every 3 to 6 months. Learn how to check your blood sugar. Learn the symptoms of low and high blood sugar and how to manage them. Always carry a quick-source of sugar with you in case you have symptoms of low blood sugar. Examples include hard  sugar candy or glucose tablets. Make sure others know that you can choke if you eat or drink when you develop serious symptoms of low blood sugar, such as seizures or unconsciousness. Get medical help at once. Tell your care team if you have high blood sugar. You might need to change the dose of your medication. If you are sick or exercising more than usual, you may need to change the dose of your medication. What side effects may I notice from receiving this medication? Side effects that you should report to your care team as soon as possible: Allergic reactions--skin rash, itching, hives, swelling of the face, lips, tongue, or throat Dehydration--increased thirst, dry mouth, feeling faint or lightheaded, headache, dark yellow or brown urine Diabetic ketoacidosis (DKA)--increased thirst or amount of urine, dry mouth, fatigue, fruity odor to breath, trouble breathing, stomach pain, nausea, vomiting Genital yeast infection--redness, swelling, pain, or itchiness, odor, thick or lumpy discharge New pain or tenderness, change in skin color, sores or ulcers, infection of the leg or foot Infection or redness, swelling, tenderness, or pain in the genitals, or area from the genitals to the back of the rectum Urinary tract infection (UTI)--burning when passing urine, passing frequent small amounts of urine, bloody or cloudy urine, pain in the lower back or sides This list may not describe all possible side effects. Call your doctor for medical advice about side effects. You may report side effects to FDA at 1-800-FDA-1088.  Where should I keep my medication? Keep out of the reach of children and pets. Store at room temperature between 20 and 25 degrees C (68 and 77 degrees F). Get rid of any unused medication after the expiration date. To get rid of medications that are no longer needed or have expired: Take the medication to a medication take-back program. Check with your pharmacy or law enforcement to find a  location. If you cannot return the medication, check the label or package insert to see if the medication should be thrown out in the garbage or flushed down the toilet. If you are not sure, ask your care team. If it is safe to put it in the trash, take the medication out of the container. Mix the medication with cat litter, dirt, coffee grounds, or other unwanted substance. Seal the mixture in a bag or container. Put it in the trash. NOTE: This sheet is a summary. It may not cover all possible information. If you have questions about this medicine, talk to your doctor, pharmacist, or health care provider.  2022 Elsevier/Gold Standard (2020-08-22 00:00:00)

## 2021-06-30 ENCOUNTER — Telehealth: Payer: Self-pay | Admitting: Family

## 2021-06-30 ENCOUNTER — Encounter: Payer: Self-pay | Admitting: Family

## 2021-06-30 ENCOUNTER — Emergency Department
Admission: EM | Admit: 2021-06-30 | Discharge: 2021-06-30 | Disposition: A | Discharge status: Home / Self Care | Payer: 59 | Attending: Emergency Medicine | Admitting: Emergency Medicine

## 2021-06-30 ENCOUNTER — Ambulatory Visit: Payer: 59 | Admitting: Family

## 2021-06-30 ENCOUNTER — Other Ambulatory Visit: Payer: Self-pay

## 2021-06-30 ENCOUNTER — Emergency Department: Payer: 59

## 2021-06-30 VITALS — BP 120/64 | HR 98 | Ht 72.0 in | Wt 250.6 lb

## 2021-06-30 DIAGNOSIS — U071 COVID-19: Secondary | ICD-10-CM | POA: Insufficient documentation

## 2021-06-30 DIAGNOSIS — R509 Fever, unspecified: Secondary | ICD-10-CM

## 2021-06-30 DIAGNOSIS — Z7901 Long term (current) use of anticoagulants: Secondary | ICD-10-CM | POA: Diagnosis not present

## 2021-06-30 DIAGNOSIS — I2510 Atherosclerotic heart disease of native coronary artery without angina pectoris: Secondary | ICD-10-CM | POA: Insufficient documentation

## 2021-06-30 DIAGNOSIS — Z7902 Long term (current) use of antithrombotics/antiplatelets: Secondary | ICD-10-CM | POA: Diagnosis not present

## 2021-06-30 DIAGNOSIS — E119 Type 2 diabetes mellitus without complications: Secondary | ICD-10-CM | POA: Diagnosis not present

## 2021-06-30 DIAGNOSIS — R0602 Shortness of breath: Secondary | ICD-10-CM | POA: Diagnosis not present

## 2021-06-30 LAB — CBC WITH DIFFERENTIAL/PLATELET
Abs Immature Granulocytes: 0 10*3/uL (ref 0.00–0.07)
Basophils Absolute: 0 10*3/uL (ref 0.0–0.1)
Basophils Relative: 0 %
Eosinophils Absolute: 0 10*3/uL (ref 0.0–0.5)
Eosinophils Relative: 0 %
HCT: 43 % (ref 39.0–52.0)
Hemoglobin: 14.3 g/dL (ref 13.0–17.0)
Immature Granulocytes: 0 %
Lymphocytes Relative: 25 %
Lymphs Abs: 1.3 10*3/uL (ref 0.7–4.0)
MCH: 30 pg (ref 26.0–34.0)
MCHC: 33.3 g/dL (ref 30.0–36.0)
MCV: 90.3 fL (ref 80.0–100.0)
Monocytes Absolute: 0.8 10*3/uL (ref 0.1–1.0)
Monocytes Relative: 15 %
Neutro Abs: 3.2 10*3/uL (ref 1.7–7.7)
Neutrophils Relative %: 60 %
Platelets: 196 10*3/uL (ref 150–400)
RBC: 4.76 MIL/uL (ref 4.22–5.81)
RDW: 12.3 % (ref 11.5–15.5)
WBC: 5.4 10*3/uL (ref 4.0–10.5)
nRBC: 0 % (ref 0.0–0.2)

## 2021-06-30 LAB — BASIC METABOLIC PANEL
Anion gap: 12 (ref 5–15)
BUN: 18 mg/dL (ref 6–20)
CO2: 22 mmol/L (ref 22–32)
Calcium: 8.9 mg/dL (ref 8.9–10.3)
Chloride: 98 mmol/L (ref 98–111)
Creatinine, Ser: 0.97 mg/dL (ref 0.61–1.24)
GFR, Estimated: >60 mL/min (ref >60)
Glucose, Bld: 286 mg/dL — ABNORMAL HIGH (ref 70–99)
Potassium: 4 mmol/L (ref 3.5–5.1)
Sodium: 132 mmol/L — ABNORMAL LOW (ref 135–145)

## 2021-06-30 LAB — POC COVID19 BINAXNOW: SARS Coronavirus 2 Ag: POSITIVE — AB

## 2021-06-30 LAB — TROPONIN I (HIGH SENSITIVITY): Troponin I (High Sensitivity): 6 ng/L (ref <18)

## 2021-06-30 LAB — POCT INFLUENZA A/B
Influenza A, POC: NEGATIVE
Influenza B, POC: NEGATIVE

## 2021-06-30 MED ORDER — ONDANSETRON 4 MG PO TBDP
4.0000 mg | ORAL_TABLET | Freq: Three times a day (TID) | ORAL | 0 refills | Status: DC | PRN
  Filled 2021-06-30: qty 20, 7d supply, fill #0

## 2021-06-30 MED ORDER — ONDANSETRON 4 MG PO TBDP
4.0000 mg | ORAL_TABLET | Freq: Once | ORAL | Status: DC
  Filled 2021-06-30: qty 1

## 2021-06-30 NOTE — Telephone Encounter (Signed)
Clare Gandy, working patient in  [place]  husband and left vm Spoke with wife   Had fever yesterday. Tmax 100.8. Endorses cough No myalgia.  He is not eating  Last night blood glucose 68 and she turned off insulin pump.  Wife he was more blank last night and more confused;  she called EMS and she reports glucose 81. He drank a coke and ate a peanut butter sandwich and he started to be more like himself and more coherent.  Then he started to vomiting , non bloody, throughout the evening, approx 7-10 times. Now since resolved. Temperature today 98.8.  She is worried about flu and wanted to test him for  He is drinking water, urinating. His current glucose is 270.   Covid test negative yesterday  Plan :  Advised wife to bring pt in for evaluation to ensure safe to be home or he would need to go to ed if medically unstable

## 2021-06-30 NOTE — Telephone Encounter (Signed)
Patient has been seen.

## 2021-06-30 NOTE — Telephone Encounter (Signed)
Patient wife say she is at home his mother with up vomiting all night, patient wife stated she just wants a flu test for her husband and Tamiflu , patient has been vomiting for 2 days, advised with history of CHF, diabetes and not being able to keep any fluids down needs to be evaluated at [facility] or ED where they can give fluids, patient wife said he will not go,  I advised lots can happen with hm having diabetes and CHF needs to be evaluated , tried to set up virtual for tomorrow and bring patient for flu test as well they refused stating will not do a virtual, advised patient wife DPR again he really need to be evaluated in person for testing for Dehydration and probable fluids, patient wife said forget it he will not go.

## 2021-06-30 NOTE — ED Provider Notes (Signed)
[facility] Provider Note    Event Date/Time   First MD Initiated Contact with Patient 06/30/21 1939     (approximate)   History   COVID+   HPI  Jose Merritt is a 46 y.o. male with history of diabetes, CVA, CAD, MI and as listed in EMR presents to the emergency department for evaluation of shortness of breath with nausea, vomiting, and fluctuation of blood glucose after testing positive for COVID today. He has been evaluated by PCP for shortness of breath prior to testing positive for COVID and was scheduled for a cardiac cath tomorrow. That appointment has been canceled. Patient continues to feel nauseated.      Physical Exam   Triage Vital Signs: ED Triage Vitals  Enc Vitals Group     BP 06/30/21 1547 134/74     Pulse Rate 06/30/21 1547 95     Resp 06/30/21 1547 20     Temp 06/30/21 1547 98.7 F (37.1 C)     Temp Source 06/30/21 1547 Oral     SpO2 06/30/21 1547 97 %     Weight 06/30/21 1551 250 lb (113.4 kg)     Height 06/30/21 1551 6' (1.829 m)     Head Circumference --      Peak Flow --      Pain Score 06/30/21 1551 0     Pain Loc --      Pain Edu? --      Excl. in GC? --     Most recent vital signs: Vitals:   06/30/21 1547  BP: 134/74  Pulse: 95  Resp: 20  Temp: 98.7 F (37.1 C)  SpO2: 97%    General: Awake, no distress.  CV:  Good peripheral perfusion.  Resp:  Normal effort.  Abd:  No distention.  Other:     ED Results / Procedures / Treatments   Labs (all labs ordered are listed, but only abnormal results are displayed) Labs Reviewed  BASIC METABOLIC PANEL - Abnormal; Notable for the following components:      Result Value   Sodium 132 (*)    Glucose, Bld 286 (*)    All other components within normal limits  CBC WITH DIFFERENTIAL/PLATELET  TROPONIN I (HIGH SENSITIVITY)  TROPONIN I (HIGH SENSITIVITY)     EKG  ED ECG REPORT I, Garrett Mitchum, FNP-BC personally viewed and interpreted this ECG.    Date: 06/30/2021  EKG Time: 1600  Rate: 94  Rhythm: normal EKG, normal sinus rhythm, unchanged from previous tracings, normal sinus rhythm  Axis: normal  Intervals:none  ST&T Change: no ST change    RADIOLOGY  Image and radiology report reviewed by me.  Chest x-ray negative for acute concerns.  PROCEDURES:  Critical Care performed: No  Procedures   MEDICATIONS ORDERED IN ED: Medications  ondansetron (ZOFRAN-ODT) disintegrating tablet 4 mg (has no administration in time range)     IMPRESSION / MDM / ASSESSMENT AND PLAN / ED COURSE  I reviewed the triage vital signs and the nursing notes.                              Differential diagnosis includes, but is not limited to COVID, CAD, PE, dehydration.  46 year old male presenting to the emergency department for treatment and evaluation of symptoms as described in the HPI.  While awaiting ER room assignment, labs were drawn.  BMP shows a glucose of 286 with  a sodium of 132.  Anion gap is normal at 12.  Troponin is normal at 6.  CBC is unremarkable.  Chest x-ray is negative for acute concerns.  EKG is unchanged from previous.  Notes reviewed from visit with Dr. Mariah Milling on 06/25/2021 for shortness of breath. He is to continue the Plavix and Xarelto along with his other daily medications with the exception of Lisinopril. Low-dose Entresto started. Lexiscan Myoview ordered for further evaluation of shortness of breath.  Today, plan will be to manage COVID symptoms specifically nausea. Zofran ordered.   Patient much improved after Zofran.  Prescription submitted to his pharmacy.  He was advised to call and reschedule his Lexiscan.  If symptoms change or worsen, he is to see primary care or return to the emergency department.     FINAL CLINICAL IMPRESSION(S) / ED DIAGNOSES   Final diagnoses:  COVID     Rx / DC Orders   ED Discharge Orders          Ordered    ondansetron (ZOFRAN-ODT) 4 MG disintegrating tablet  Every 8  hours PRN        06/30/21 2129             Note:  This document was prepared using Dragon voice recognition software and may include unintentional dictation errors.   Chinita Pester, FNP 06/30/21 2312    Chesley Noon, MD 06/30/21 [phone]

## 2021-06-30 NOTE — Progress Notes (Addendum)
Subjective:    Patient ID: Jose Merritt, male    DOB: 1975/12/12, 46 y.o.   MRN: [id number]  CC: Jose Merritt is a 46 y.o. male who presents today for an acute visit.    HPI: Accompanied by wife Jose Merritt  Complains of non bloody emesis, general malaise, cough x 1 day.   He endorses increased shortness of breath, headache, decreased appetite.  He has not taken any medication for this  Had fever yesterday. Tmax 100.8.  Denies  myalgias, abdominal pain, vomiting, diarrhea.   Leg swelling resolved.     Last night blood glucose 68   Wife describes last night he was ' more blank' last night and more confused, Blood sugar at that time 80 and wife then advised to turned off insulin pump..  She called EMS and she reports glucose 81. He drank a coke and ate a peanut butter sandwich and he started to be more like himself and more coherent.   Then he started to vomiting , non bloody, throughout the evening last night, approx 7-10 times. Now since resolved. Temperature today 98.8.   He is drinking water today and  urinating. His current glucose is 270 at noon approx today.      CMA Maralyn Sago has spoken with [facility] endocrine triage in Dr Tedd Sias office regarding sick day dosing for insulin pump  HISTORY:  Past Medical History:  Diagnosis Date   Asthma    Bipolar disorder (HCC)    CHF (congestive heart failure) (HCC)    Chicken pox    Clotting disorder (HCC)    lung   Depression    Diabetes mellitus without complication (HCC) 2003   Type 2   Heart murmur    as child   Hyperlipidemia    Hypertension    Myocardial infarction (HCC) 06/2015   Pulmonary embolism (HCC) 2018   Retinopathy of both eyes 01/2016   SOB (shortness of breath) 08/17/2016   Stroke [facility])    Past Surgical History:  Procedure Laterality Date   CORONARY ANGIOPLASTY WITH STENT PLACEMENT Left 06/2015   LADx1 stent   WISDOM TOOTH EXTRACTION     Family History  Problem Relation Age of Onset    Hypertension Mother    Arthritis Mother    Heart disease Father        3 heart attacks   Diabetes Father    Hypertension Father    Hyperlipidemia Father    Alcohol abuse Father    Hypertension Brother    Hyperlipidemia Brother    Heart disease Maternal Uncle    Heart disease Paternal Aunt    Hypertension Maternal Grandmother    Kidney disease Maternal Grandmother    Arthritis Maternal Grandmother    Heart disease Maternal Grandfather    Hyperlipidemia Maternal Grandfather    Diabetes Maternal Grandfather    Kidney disease Maternal Grandfather    Heart disease Paternal Grandmother    Kidney disease Paternal Grandmother    Kidney disease Paternal Grandfather    Colon cancer Neg Hx     Allergies: Metrizamide, Other, Statins, Sulfa antibiotics, Sulfamethoxazole-trimethoprim, Sulfasalazine, and Contrast media [iodinated contrast media] Current Outpatient Medications on File Prior to Visit  Medication Sig Dispense Refill   atorvastatin (LIPITOR) 80 MG tablet Take 1 tablet (80 mg total) by mouth once daily 90 tablet 3   clopidogrel (PLAVIX) 75 MG tablet Take 1 tablet (75 mg total) by mouth daily. 90 tablet 3   ezetimibe (ZETIA) 10 MG tablet Take  1 tablet (10 mg total) by mouth once daily 90 tablet 3   furosemide (LASIX) 20 MG tablet Take 1 tablet (20 mg total) by mouth daily. 30 tablet 3   gabapentin (NEURONTIN) 600 MG tablet Take 3 tablets (1,800 mg total) by mouth at bedtime. 90 tablet 1   insulin lispro (HUMALOG) 100 UNIT/ML injection Take up to 150 units daily via insulin pump 140 mL 3   metoprolol succinate (TOPROL-XL) 100 MG 24 hr tablet Take 1 tablet (100 mg total) by mouth nightly 90 tablet 3   nitroGLYCERIN (NITROSTAT) 0.3 MG SL tablet Place 1 tablet (0.3 mg total) under the tongue every 5 (five) minutes as needed. 25 tablet 3   rivaroxaban (XARELTO) 2.5 MG TABS tablet TAKE 1 TABLET BY MOUTH TWICE DAILY 180 tablet 3   sacubitril-valsartan (ENTRESTO) 24-26 MG Take 1 tablet by  mouth 2 (two) times daily. 60 tablet 6   sertraline (ZOLOFT) 50 MG tablet Take 1 tablet (50 mg total) by mouth at bedtime. 90 tablet 3   spironolactone (ALDACTONE) 25 MG tablet TAKE 1/2 TABLET BY MOUTH DAILY EVERY MORNING 45 tablet 3   No current facility-administered medications on file prior to visit.    Social History   Tobacco Use   Smoking status: Never   Smokeless tobacco: Never  Vaping Use   Vaping Use: Never used  Substance Use Topics   Alcohol use: No    Comment: rare- 2x/year   Drug use: No    Review of Systems  Constitutional:  Negative for chills and fever.  HENT:  Positive for congestion. Negative for ear pain, rhinorrhea, sinus pressure and sore throat.   Respiratory:  Positive for cough and shortness of breath. Negative for wheezing.   Cardiovascular:  Negative for chest pain and palpitations.  Gastrointestinal:  Positive for vomiting (resolved currently). Negative for abdominal pain, diarrhea and nausea.  Genitourinary:  Negative for dysuria.  Musculoskeletal:  Negative for myalgias.  Skin:  Negative for rash.  Neurological:  Positive for headaches.  Hematological:  Negative for adenopathy.     Objective:    BP 120/64    Pulse 98    Ht 6' (1.829 m)    Wt 250 lb 9.6 oz (113.7 kg)    SpO2 97%    BMI 33.99 kg/m   Wt Readings from Last 3 Encounters:  06/30/21 250 lb (113.4 kg)  06/30/21 250 lb 9.6 oz (113.7 kg)  06/25/21 255 lb 8 oz (115.9 kg)     Physical Exam Vitals reviewed.  Constitutional:      Appearance: He is well-developed.  HENT:     Head: Normocephalic and atraumatic.     Right Ear: Hearing, tympanic membrane, ear canal and external ear normal. No decreased hearing noted. No drainage, swelling or tenderness. No middle ear effusion. Tympanic membrane is not injected, erythematous or bulging.     Left Ear: Hearing, tympanic membrane, ear canal and external ear normal. No decreased hearing noted. No drainage, swelling or tenderness.  No middle  ear effusion. Tympanic membrane is not injected, erythematous or bulging.     Nose: Nose normal.     Right Sinus: No maxillary sinus tenderness or frontal sinus tenderness.     Left Sinus: No maxillary sinus tenderness or frontal sinus tenderness.     Mouth/Throat:     Pharynx: Uvula midline. No oropharyngeal exudate or posterior oropharyngeal erythema.     Tonsils: No tonsillar abscesses.  Eyes:     Conjunctiva/sclera: Conjunctivae normal.  Cardiovascular:     Rate and Rhythm: Regular rhythm.     Heart sounds: Normal heart sounds.  Pulmonary:     Effort: Pulmonary effort is normal. No respiratory distress.     Breath sounds: Normal breath sounds. No wheezing, rhonchi or rales.  Musculoskeletal:     Right lower leg: No edema.     Left lower leg: No edema.  Lymphadenopathy:     Head:     Right side of head: No submental, submandibular, tonsillar, preauricular, posterior auricular or occipital adenopathy.     Left side of head: No submental, submandibular, tonsillar, preauricular, posterior auricular or occipital adenopathy.     Cervical: No cervical adenopathy.  Skin:    General: Skin is warm and dry.  Neurological:     Mental Status: He is alert.  Psychiatric:        Speech: Speech normal.        Behavior: Behavior normal.       Assessment & Plan:   Problem List Items Addressed This Visit       Other   [id number]    Patient appears weak. Labored in speech. Reassured as walking sa02 remains 98%. Concerned for hydration status, hyperglycemia and risk of severe disease in setting of CHF, DM2. He was scheduled for stress test tomorrow with Dr Rockey Situ and this has been post poned after wife spoke with Nuclear medicine.  Discussed my concern with patient especially as he looks weak and increased sob in office today.  [facility] patient and wife are very agreeable and understanding with this.  Suspect he will need fluids, cxr,  and  consideration for possible hospital admission.    Report given to triage given at [facility] ED. Will follow.        Other Visit Diagnoses     Fever, unspecified fever cause    -  Primary   Relevant Orders   POCT Influenza A/B (Completed)   POC COVID-19 (Completed)         I am having Metta Clines. Schwebach maintain his metoprolol succinate, insulin lispro, atorvastatin, clopidogrel, ezetimibe, nitroGLYCERIN, rivaroxaban, spironolactone, gabapentin, sertraline, furosemide, and Entresto.   No orders of the defined types were placed in this encounter.   Return precautions given.   Risks, benefits, and alternatives of the medications and treatment plan prescribed today were discussed, and patient expressed understanding.   Education regarding symptom management and diagnosis given to patient on AVS.  Continue to follow with Burnard Hawthorne, FNP for routine health maintenance.   Madaline Savage and I agreed with plan.   Mable Paris, FNP

## 2021-06-30 NOTE — Telephone Encounter (Signed)
I have spoken to [facility] End[facility] they will be sending a HP message back to Dr. Tedd Sias & CMA to please advise patient on sick dosing. They have number to also reach back out to [place].

## 2021-06-30 NOTE — Telephone Encounter (Signed)
Would you go ahead and preemptively call [facility] endo , Dr Tedd Sias, regarding sick day dosing with insulin pump  He will need advice from endo as to how to titrate insulin pump after acute vomiting ( 8 times overnight) , since resolved per wife. We are seeing him this afternoon

## 2021-06-30 NOTE — Assessment & Plan Note (Addendum)
Patient appears weak. Labored in speech. Reassured as walking sa02 remains 98%. Concerned for hydration status, hyperglycemia and risk of severe disease in setting of CHF, DM2. He was scheduled for stress test tomorrow with Dr Mariah Milling and this has been post poned after wife spoke with Nuclear medicine.  Discussed my concern with patient especially as he looks weak and increased sob in office today.  Advised [facility] ED patient and wife are very agreeable and understanding with this.  Suspect he will need fluids, cxr,  and  consideration for possible hospital admission.   Report given to triage given at [facility] ED. Will follow.

## 2021-06-30 NOTE — ED Triage Notes (Signed)
Patient to ER via POV with complaints of generalized body aches and shortness of breath since Saturday. Reports testing positive for covid today. Patient has also been experiencing nausea and vomiting.   Patient was scheduled for cardiac cath tomorrow.  Hx of CHF/ DM2

## 2021-06-30 NOTE — Telephone Encounter (Addendum)
Pt wife called In stating that the pt was vomiting and had a fever on yesterday. Pt wife stated that he was vomiting today but had no fever. Pt wife said that his sugar was low and they had to call the EMT. Pt wife stated that he is on lasix, has heart congestion failure and type 2 diabetes. Pt wife stated he has no fluids in him. Pt wife is concerned because he has a stress test tomorrow and does not think he can do it. Sent to access nurse. Nurse called back and stated pt wife declined to call him because she doesn't think he will answer the phone. Pt wife want to talk to arnette or covering doctor [phone]

## 2021-07-01 ENCOUNTER — Other Ambulatory Visit: Payer: Self-pay

## 2021-07-01 ENCOUNTER — Ambulatory Visit: Payer: 59

## 2021-07-04 ENCOUNTER — Other Ambulatory Visit: Payer: Self-pay

## 2021-07-07 ENCOUNTER — Other Ambulatory Visit: Payer: Self-pay

## 2021-07-21 ENCOUNTER — Other Ambulatory Visit: Payer: Self-pay

## 2021-07-21 NOTE — Progress Notes (Signed)
Mychart message sent.

## 2021-07-23 ENCOUNTER — Ambulatory Visit: Payer: 59 | Admitting: Family

## 2021-08-06 ENCOUNTER — Other Ambulatory Visit: Payer: Self-pay

## 2021-08-07 ENCOUNTER — Other Ambulatory Visit: Payer: Self-pay

## 2021-08-13 ENCOUNTER — Other Ambulatory Visit: Payer: Self-pay

## 2021-08-13 ENCOUNTER — Encounter: Payer: Self-pay | Admitting: Cardiovascular Disease

## 2021-08-13 MED ORDER — ENTRESTO 24-26 MG PO TABS: 1.0000 | ORAL_TABLET | Freq: Two times a day (BID) | ORAL | 6 refills | Status: DC

## 2021-08-13 MED ORDER — ENTRESTO 24-26 MG PO TABS
1.0000 | ORAL_TABLET | Freq: Two times a day (BID) | ORAL | 3 refills | Status: DC
  Filled 2021-08-13: qty 180, 90d supply, fill #0
  Filled 2021-09-22 – 2022-03-04 (×4): qty 180, 90d supply, fill #1
  Filled 2022-05-12: qty 180, 90d supply, fill #2

## 2021-08-14 ENCOUNTER — Other Ambulatory Visit: Payer: Self-pay

## 2021-08-31 DIAGNOSIS — Z20822 Contact with and (suspected) exposure to covid-19: Secondary | ICD-10-CM | POA: Diagnosis not present

## 2021-08-31 DIAGNOSIS — Z955 Presence of coronary angioplasty implant and graft: Secondary | ICD-10-CM | POA: Diagnosis not present

## 2021-08-31 DIAGNOSIS — J9811 Atelectasis: Secondary | ICD-10-CM | POA: Diagnosis not present

## 2021-08-31 DIAGNOSIS — R112 Nausea with vomiting, unspecified: Secondary | ICD-10-CM | POA: Diagnosis not present

## 2021-08-31 DIAGNOSIS — R0602 Shortness of breath: Secondary | ICD-10-CM | POA: Diagnosis not present

## 2021-08-31 DIAGNOSIS — J189 Pneumonia, unspecified organism: Secondary | ICD-10-CM | POA: Diagnosis not present

## 2021-08-31 DIAGNOSIS — R110 Nausea: Secondary | ICD-10-CM | POA: Diagnosis not present

## 2021-08-31 DIAGNOSIS — Z86711 Personal history of pulmonary embolism: Secondary | ICD-10-CM | POA: Diagnosis not present

## 2021-08-31 DIAGNOSIS — I252 Old myocardial infarction: Secondary | ICD-10-CM | POA: Diagnosis not present

## 2021-08-31 DIAGNOSIS — R739 Hyperglycemia, unspecified: Secondary | ICD-10-CM | POA: Diagnosis not present

## 2021-08-31 DIAGNOSIS — Z7902 Long term (current) use of antithrombotics/antiplatelets: Secondary | ICD-10-CM | POA: Diagnosis not present

## 2021-08-31 DIAGNOSIS — Z7901 Long term (current) use of anticoagulants: Secondary | ICD-10-CM | POA: Diagnosis not present

## 2021-08-31 DIAGNOSIS — R109 Unspecified abdominal pain: Secondary | ICD-10-CM | POA: Diagnosis not present

## 2021-09-01 DIAGNOSIS — R0602 Shortness of breath: Secondary | ICD-10-CM | POA: Diagnosis not present

## 2021-09-01 DIAGNOSIS — J189 Pneumonia, unspecified organism: Secondary | ICD-10-CM | POA: Diagnosis not present

## 2021-09-01 DIAGNOSIS — Z955 Presence of coronary angioplasty implant and graft: Secondary | ICD-10-CM | POA: Diagnosis not present

## 2021-09-01 DIAGNOSIS — Z7901 Long term (current) use of anticoagulants: Secondary | ICD-10-CM | POA: Diagnosis not present

## 2021-09-01 DIAGNOSIS — I252 Old myocardial infarction: Secondary | ICD-10-CM | POA: Diagnosis not present

## 2021-09-01 DIAGNOSIS — J9811 Atelectasis: Secondary | ICD-10-CM | POA: Diagnosis not present

## 2021-09-01 DIAGNOSIS — Z86711 Personal history of pulmonary embolism: Secondary | ICD-10-CM | POA: Diagnosis not present

## 2021-09-01 DIAGNOSIS — Z7902 Long term (current) use of antithrombotics/antiplatelets: Secondary | ICD-10-CM | POA: Diagnosis not present

## 2021-09-01 DIAGNOSIS — R109 Unspecified abdominal pain: Secondary | ICD-10-CM | POA: Diagnosis not present

## 2021-09-01 DIAGNOSIS — E162 Hypoglycemia, unspecified: Secondary | ICD-10-CM | POA: Diagnosis not present

## 2021-09-01 DIAGNOSIS — R739 Hyperglycemia, unspecified: Secondary | ICD-10-CM | POA: Diagnosis not present

## 2021-09-01 DIAGNOSIS — R112 Nausea with vomiting, unspecified: Secondary | ICD-10-CM | POA: Diagnosis not present

## 2021-09-02 DIAGNOSIS — E1165 Type 2 diabetes mellitus with hyperglycemia: Secondary | ICD-10-CM | POA: Diagnosis not present

## 2021-09-23 ENCOUNTER — Other Ambulatory Visit: Payer: Self-pay

## 2021-10-07 ENCOUNTER — Other Ambulatory Visit: Payer: Self-pay

## 2021-10-08 ENCOUNTER — Other Ambulatory Visit: Payer: Self-pay

## 2021-10-09 ENCOUNTER — Other Ambulatory Visit: Payer: Self-pay

## 2021-10-17 ENCOUNTER — Other Ambulatory Visit: Payer: Self-pay

## 2021-10-20 ENCOUNTER — Other Ambulatory Visit: Payer: Self-pay

## 2021-10-24 ENCOUNTER — Other Ambulatory Visit: Payer: Self-pay

## 2021-11-06 ENCOUNTER — Other Ambulatory Visit: Payer: Self-pay

## 2021-11-06 ENCOUNTER — Other Ambulatory Visit: Payer: Self-pay | Admitting: Family Medicine

## 2021-11-06 ENCOUNTER — Encounter: Payer: Self-pay | Admitting: Family

## 2021-11-07 ENCOUNTER — Other Ambulatory Visit: Payer: Self-pay

## 2021-11-07 ENCOUNTER — Other Ambulatory Visit: Payer: Self-pay | Admitting: Family

## 2021-11-07 ENCOUNTER — Other Ambulatory Visit: Payer: Self-pay | Admitting: Cardiovascular Disease

## 2021-11-07 DIAGNOSIS — T753XXA Motion sickness, initial encounter: Secondary | ICD-10-CM

## 2021-11-07 MED ORDER — SCOPOLAMINE 1 MG/3DAYS TD PT72
1.0000 | MEDICATED_PATCH | TRANSDERMAL | 0 refills | Status: DC
  Filled 2021-11-07: qty 10, 30d supply, fill #0

## 2021-11-07 MED ORDER — ONDANSETRON HCL 4 MG PO TABS
4.0000 mg | ORAL_TABLET | Freq: Three times a day (TID) | ORAL | 1 refills | Status: DC | PRN
  Filled 2021-11-07: qty 20, 7d supply, fill #0

## 2021-11-07 NOTE — Progress Notes (Signed)
close

## 2021-11-23 ENCOUNTER — Other Ambulatory Visit: Payer: Self-pay

## 2021-11-26 ENCOUNTER — Other Ambulatory Visit: Payer: Self-pay

## 2021-11-26 DIAGNOSIS — E1165 Type 2 diabetes mellitus with hyperglycemia: Secondary | ICD-10-CM | POA: Diagnosis not present

## 2021-12-11 ENCOUNTER — Other Ambulatory Visit: Payer: Self-pay

## 2021-12-11 ENCOUNTER — Encounter: Payer: Self-pay | Admitting: Family

## 2021-12-11 DIAGNOSIS — E782 Mixed hyperlipidemia: Secondary | ICD-10-CM

## 2021-12-11 DIAGNOSIS — E1140 Type 2 diabetes mellitus with diabetic neuropathy, unspecified: Secondary | ICD-10-CM

## 2021-12-11 DIAGNOSIS — I10 Essential (primary) hypertension: Secondary | ICD-10-CM

## 2021-12-11 DIAGNOSIS — N400 Enlarged prostate without lower urinary tract symptoms: Secondary | ICD-10-CM

## 2021-12-12 ENCOUNTER — Other Ambulatory Visit: Payer: 59

## 2021-12-12 NOTE — Telephone Encounter (Signed)
Spoke to patient and informed him that I had spoken to [facility] and she insisted on him going to the UC today for his stomach pain. Patient stated that he would go to UC.

## 2021-12-14 ENCOUNTER — Other Ambulatory Visit: Payer: Self-pay

## 2021-12-14 ENCOUNTER — Emergency Department: Payer: 59

## 2021-12-14 ENCOUNTER — Observation Stay
Admission: EM | Admit: 2021-12-14 | Discharge: 2021-12-14 | Disposition: A | Discharge status: Home / Self Care | Payer: 59 | Attending: Family Medicine | Admitting: Family Medicine

## 2021-12-14 DIAGNOSIS — I10 Essential (primary) hypertension: Secondary | ICD-10-CM | POA: Insufficient documentation

## 2021-12-14 DIAGNOSIS — J9811 Atelectasis: Secondary | ICD-10-CM | POA: Diagnosis not present

## 2021-12-14 DIAGNOSIS — K429 Umbilical hernia without obstruction or gangrene: Secondary | ICD-10-CM | POA: Diagnosis not present

## 2021-12-14 DIAGNOSIS — R748 Abnormal levels of other serum enzymes: Secondary | ICD-10-CM | POA: Diagnosis not present

## 2021-12-14 DIAGNOSIS — Z7901 Long term (current) use of anticoagulants: Secondary | ICD-10-CM | POA: Insufficient documentation

## 2021-12-14 DIAGNOSIS — R0789 Other chest pain: Principal | ICD-10-CM | POA: Insufficient documentation

## 2021-12-14 DIAGNOSIS — E1165 Type 2 diabetes mellitus with hyperglycemia: Secondary | ICD-10-CM | POA: Diagnosis not present

## 2021-12-14 DIAGNOSIS — K760 Fatty (change of) liver, not elsewhere classified: Secondary | ICD-10-CM | POA: Diagnosis not present

## 2021-12-14 DIAGNOSIS — I2510 Atherosclerotic heart disease of native coronary artery without angina pectoris: Secondary | ICD-10-CM | POA: Insufficient documentation

## 2021-12-14 DIAGNOSIS — K381 Appendicular concretions: Secondary | ICD-10-CM | POA: Diagnosis not present

## 2021-12-14 DIAGNOSIS — R109 Unspecified abdominal pain: Secondary | ICD-10-CM | POA: Diagnosis not present

## 2021-12-14 DIAGNOSIS — R079 Chest pain, unspecified: Secondary | ICD-10-CM | POA: Diagnosis present

## 2021-12-14 DIAGNOSIS — I700 Atherosclerosis of aorta: Secondary | ICD-10-CM | POA: Diagnosis not present

## 2021-12-14 DIAGNOSIS — Z7902 Long term (current) use of antithrombotics/antiplatelets: Secondary | ICD-10-CM | POA: Diagnosis not present

## 2021-12-14 LAB — URINALYSIS, ROUTINE W REFLEX MICROSCOPIC
Bilirubin Urine: NEGATIVE
Glucose, UA: >=500 mg/dL — AB
Ketones, ur: NEGATIVE mg/dL
Leukocytes,Ua: NEGATIVE
Nitrite: NEGATIVE
Protein, ur: 100 mg/dL — AB
Specific Gravity, Urine: 1.026 (ref 1.005–1.030)
Squamous Epithelial / HPF: NONE SEEN (ref 0–5)
pH: 6 (ref 5.0–8.0)

## 2021-12-14 LAB — COMPREHENSIVE METABOLIC PANEL
ALT: 23 U/L (ref 0–44)
AST: 16 U/L (ref 15–41)
Albumin: 4.1 g/dL (ref 3.5–5.0)
Alkaline Phosphatase: 113 U/L (ref 38–126)
Anion gap: 7 (ref 5–15)
BUN: 23 mg/dL — ABNORMAL HIGH (ref 6–20)
CO2: 24 mmol/L (ref 22–32)
Calcium: 8.9 mg/dL (ref 8.9–10.3)
Chloride: 100 mmol/L (ref 98–111)
Creatinine, Ser: 1.05 mg/dL (ref 0.61–1.24)
GFR, Estimated: >60 mL/min (ref >60)
Glucose, Bld: 496 mg/dL — ABNORMAL HIGH (ref 70–99)
Potassium: 4.3 mmol/L (ref 3.5–5.1)
Sodium: 131 mmol/L — ABNORMAL LOW (ref 135–145)
Total Bilirubin: 0.7 mg/dL (ref 0.3–1.2)
Total Protein: 7.8 g/dL (ref 6.5–8.1)

## 2021-12-14 LAB — CBC WITH DIFFERENTIAL/PLATELET
Abs Immature Granulocytes: 0.03 10*3/uL (ref 0.00–0.07)
Basophils Absolute: 0.1 10*3/uL (ref 0.0–0.1)
Basophils Relative: 1 %
Eosinophils Absolute: 0.2 10*3/uL (ref 0.0–0.5)
Eosinophils Relative: 2 %
HCT: 41.6 % (ref 39.0–52.0)
Hemoglobin: 14.2 g/dL (ref 13.0–17.0)
Immature Granulocytes: 0 %
Lymphocytes Relative: 36 %
Lymphs Abs: 2.8 10*3/uL (ref 0.7–4.0)
MCH: 29.7 pg (ref 26.0–34.0)
MCHC: 34.1 g/dL (ref 30.0–36.0)
MCV: 87 fL (ref 80.0–100.0)
Monocytes Absolute: 0.9 10*3/uL (ref 0.1–1.0)
Monocytes Relative: 12 %
Neutro Abs: 3.9 10*3/uL (ref 1.7–7.7)
Neutrophils Relative %: 49 %
Platelets: 223 10*3/uL (ref 150–400)
RBC: 4.78 MIL/uL (ref 4.22–5.81)
RDW: 12.3 % (ref 11.5–15.5)
WBC: 8 10*3/uL (ref 4.0–10.5)
nRBC: 0 % (ref 0.0–0.2)

## 2021-12-14 LAB — TROPONIN I (HIGH SENSITIVITY)
Troponin I (High Sensitivity): 4 ng/L (ref <18)
Troponin I (High Sensitivity): 4 ng/L (ref <18)

## 2021-12-14 LAB — LIPASE, BLOOD: Lipase: 103 U/L — ABNORMAL HIGH (ref 11–51)

## 2021-12-14 MED ORDER — DIPHENHYDRAMINE HCL 50 MG/ML IJ SOLN
25.0000 mg | Freq: Once | INTRAMUSCULAR | Status: AC
  Administered 2021-12-14: 25 mg via INTRAVENOUS
  Filled 2021-12-14: qty 1

## 2021-12-14 MED ORDER — ASPIRIN 81 MG PO CHEW
324.0000 mg | CHEWABLE_TABLET | Freq: Once | ORAL | Status: AC
  Administered 2021-12-14: 324 mg via ORAL
  Filled 2021-12-14: qty 4

## 2021-12-14 MED ORDER — IOHEXOL 300 MG/ML  SOLN
100.0000 mL | Freq: Once | INTRAMUSCULAR | Status: AC | PRN
  Administered 2021-12-14: 100 mL via INTRAVENOUS

## 2021-12-14 MED ORDER — LIDOCAINE 5 % EX PTCH
1.0000 | MEDICATED_PATCH | CUTANEOUS | Status: DC
  Administered 2021-12-14: 1 via TRANSDERMAL
  Filled 2021-12-14: qty 1

## 2021-12-14 MED ORDER — INSULIN ASPART 100 UNIT/ML IJ SOLN
10.0000 [IU] | Freq: Once | INTRAMUSCULAR | Status: AC
  Administered 2021-12-14: 10 [IU] via INTRAVENOUS
  Filled 2021-12-14: qty 1

## 2021-12-14 MED ORDER — LACTATED RINGERS IV BOLUS
1000.0000 mL | Freq: Once | INTRAVENOUS | Status: AC
  Administered 2021-12-14: 1000 mL via INTRAVENOUS

## 2021-12-14 MED ORDER — ACETAMINOPHEN 500 MG PO TABS
1000.0000 mg | ORAL_TABLET | Freq: Once | ORAL | Status: AC
Start: 2021-12-14 — End: 2021-12-14
  Administered 2021-12-14: 1000 mg via ORAL
  Filled 2021-12-14: qty 2

## 2021-12-14 NOTE — ED Provider Notes (Addendum)
[facility] Provider Note    Event Date/Time   First MD Initiated Contact with Patient 12/14/21 [phone]     (approximate)   History   Abdominal Pain   HPI  Jose Merritt is a 46 y.o. male who presents to the ED for evaluation of Abdominal Pain   I reviewed cardiology clinic visit from January.  History of CAD with stenting to LAD in 2017.  DM, HTN, HLD, history of PE.  Ejection fraction of 35%.  Anticoagulated on Xarelto and Plavix.  Patient presents to the ED for evaluation of left chest and abd pain. He reports pain to the left mid-clavicular line around T8/9. He reports often using an insulin pump and this being the site of insertion recently.   Pain started 2 weeks ago and has progressively worsened. Denies worsening pain with exertion, but pain is worse with palpation. Denies any trauma beyond his insulin pump site. Reports removing his pump in the past 2-3 days.   Reports tolerating p.o. and toileting at his baseline without nausea or emesis.  No fevers.  No dysuria.  Reports the pains been going on long enough and seems to be worsening and reports concerns for his heart so he presents to the ED.   Physical Exam   Triage Vital Signs: ED Triage Vitals  Enc Vitals Group     BP 12/14/21 0211 139/74     Pulse Rate 12/14/21 0211 80     Resp 12/14/21 0211 19     Temp 12/14/21 0211 97.6 F (36.4 C)     Temp Source 12/14/21 0211 Oral     SpO2 12/14/21 0211 97 %     Weight --      Height --      Head Circumference --      Peak Flow --      Pain Score 12/14/21 0155 3     Pain Loc --      Pain Edu? --      Excl. in [place]? --     Most recent vital signs: Vitals:   12/14/21 0233 12/14/21 0422  BP:  (!) 143/82  Pulse:  78  Resp:  16  Temp:    SpO2: 100% 99%    General: Awake, no distress.  Well-appearing, pleasant and conversational. CV:  Good peripheral perfusion. RRR Resp:  Normal effort.  Abd:  No distention.  Soft and benign  throughout MSK:  No deformity noted.    No overlying skin changes, erythema or deformity noted to the site of pain.  On palpation at the site of pain a few centimeters inferior to his left nipple he has a small 1 cm subcutaneous palpable smooth defect that feels like a small cyst, or perhaps a locally spasming muscle belly.  He is tender to this area. Beneath this and beneath his costal margin, to his abdomen, he is nontender. Neuro:  No focal deficits appreciated. Other:     ED Results / Procedures / Treatments   Labs (all labs ordered are listed, but only abnormal results are displayed) Labs Reviewed  LIPASE, BLOOD - Abnormal; Notable for the following components:      Result Value   Lipase 103 (*)    All other components within normal limits  COMPREHENSIVE METABOLIC PANEL - Abnormal; Notable for the following components:   Sodium 131 (*)    Glucose, Bld 496 (*)    BUN 23 (*)    All other components within normal  limits  URINALYSIS, ROUTINE W REFLEX MICROSCOPIC - Abnormal; Notable for the following components:   Color, Urine STRAW (*)    APPearance CLEAR (*)    Glucose, UA >=500 (*)    Hgb urine dipstick MODERATE (*)    Protein, ur 100 (*)    Bacteria, UA RARE (*)    All other components within normal limits  CBC WITH DIFFERENTIAL/PLATELET  TROPONIN I (HIGH SENSITIVITY)  TROPONIN I (HIGH SENSITIVITY)    EKG Sinus rhythm with a rate of 88 bpm.  Leftward axis and normal intervals.  Nonspecific ST changes inferiorly and laterally without STEMI.   RADIOLOGY CXR interpreted by me without evidence of acute cardiopulmonary pathology. CT chest, abdomen and pelvis interpreted by me without evidence of any subcutaneous pathology at the site of pain.  Official radiology report(s): CT CHEST ABDOMEN PELVIS W CONTRAST  Result Date: 12/14/2021 CLINICAL DATA:  Left anterior chest wall pain. Evaluate for cyst or abscess. EXAM: CT CHEST, ABDOMEN, AND PELVIS WITH CONTRAST TECHNIQUE:  Multidetector CT imaging of the chest, abdomen and pelvis was performed following the standard protocol during bolus administration of intravenous contrast. RADIATION DOSE REDUCTION: This exam was performed according to the departmental dose-optimization program which includes automated exposure control, adjustment of the mA and/or kV according to patient size and/or use of iterative reconstruction technique. CONTRAST:  OMNIPAQUE IOHEXOL 300 MG/ML  SOLN COMPARISON:  Chest radiograph dated 12/14/2021 and CT abdomen pelvis dated 10/30/2020. FINDINGS: CT CHEST FINDINGS Cardiovascular: There is no cardiomegaly or pericardial effusion. Advanced coronary vascular calcification of the LAD. Minimal atherosclerotic calcification of the aortic arch. No aneurysmal dilatation or dissection. The origins of the great vessels of the aortic arch and the central pulmonary arteries appear patent. Mediastinum/Nodes: No hilar or mediastinal adenopathy. The esophagus and the thyroid gland are grossly unremarkable. No mediastinal fluid collection. Lungs/Pleura: There is mild eventration of the right hemidiaphragm with right lung base linear atelectasis. No focal consolidation, pleural effusion, or pneumothorax. The central airways are patent. Musculoskeletal: No acute osseous pathology. No fluid collection or abscess in the chest wall. CT ABDOMEN PELVIS FINDINGS No intra-abdominal free air or free fluid. Hepatobiliary: Mild fatty liver. No intrahepatic biliary dilatation. The gallbladder is unremarkable. Pancreas: Unremarkable. No pancreatic ductal dilatation or surrounding inflammatory changes. Spleen: Normal in size without focal abnormality. Adrenals/Urinary Tract: The adrenal glands are unremarkable. The kidneys, visualized ureters, and urinary bladder appear unremarkable. Stomach/Bowel: There is no bowel obstruction or active inflammation. Multiple small stones within the appendix. No CT evidence of acute appendicitis.  Vascular/Lymphatic: Mild aortoiliac atherosclerotic disease. The IVC is unremarkable. No portal venous gas. There is no adenopathy. Reproductive: The prostate and seminal vesicles are grossly unremarkable. No pelvic mass. Other: Small fat containing umbilical hernia. Musculoskeletal: No acute or significant osseous findings. IMPRESSION: 1. No acute intrathoracic, abdominal, or pelvic pathology. No fluid collection or abscess. 2. Coronary vascular calcification of the LAD. 3. Mild fatty liver. 4. Aortic Atherosclerosis (ICD10-I70.0). Electronically Signed   By: Elgie Collard M.D.   On: 12/14/2021 03:51   DG Chest 2 View  Result Date: 12/14/2021 CLINICAL DATA:  Chest pain. EXAM: CHEST - 2 VIEW COMPARISON:  Chest radiograph dated 06/30/2021. FINDINGS: Mild eventration of the right hemidiaphragm. No focal consolidation, pleural effusion, pneumothorax. The cardiac silhouette is within normal limits. No acute osseous pathology. IMPRESSION: No active cardiopulmonary disease. Electronically Signed   By: Elgie Collard M.D.   On: 12/14/2021 02:27    PROCEDURES and INTERVENTIONS:  .1-3 Lead EKG  Interpretation  Performed by: Vladimir Crofts, MD Authorized by: Vladimir Crofts, MD     Interpretation: normal     ECG rate:  80   ECG rate assessment: normal     Rhythm: sinus rhythm     Ectopy: none     Conduction: normal     Medications  lidocaine (LIDODERM) 5 % 1 patch (1 patch Transdermal Patch Applied 12/14/21 0423)  diphenhydrAMINE (BENADRYL) injection 25 mg (25 mg Intravenous Given 12/14/21 0318)  lactated ringers bolus 1,000 mL (0 mLs Intravenous Stopped 12/14/21 0425)  insulin aspart (novoLOG) injection 10 Units (10 Units Intravenous Given 12/14/21 0322)  iohexol (OMNIPAQUE) 300 MG/ML solution 100 mL (100 mLs Intravenous Contrast Given 12/14/21 0341)  acetaminophen (TYLENOL) tablet 1,000 mg (1,000 mg Oral Given 12/14/21 0431)  aspirin chewable tablet 324 mg (324 mg Oral Given 12/14/21 0549)      IMPRESSION / MDM / ASSESSMENT AND PLAN / ED COURSE  I reviewed the triage vital signs and the nursing notes.  Differential diagnosis includes, but is not limited to, cyst, abscess, ACS, PTX, pancreatitis, SBO, DKA  {Patient presents with symptoms of an acute illness or injury that is potentially life-threatening.  46 year old male with history of CAD presents with atypical left-sided discomfort requiring medical observation admission.  He looks systemically well and has an area of tenderness with a small palpable deformity that feels like a cyst.  Considering this is where his insulin pump has been placed in the past, my first thought was related to this such as a subcutaneous cyst or abscess, but has no overlying skin changes, nothing coming to ahead and his CT demonstrates no evidence of subcutaneous derangements.  While he is tender, and his symptoms are atypical, with his coronary/diabetic history I am concerned about the possibility of atypical unstable angina.  His troponins are negative and no signs of NSTEMI or STEMI.  Hyperglycemic without acidosis, treated with IV insulin.  Lipase is somewhat elevated, but he has no intra-abdominal symptoms such as emesis, tenderness or nausea.  We will consult with medicine for observation admission  Clinical Course as of 12/14/21 0554  Nancy Fetter Dec 14, 2021  0303 Patient has a documented iodinated contrast allergy that is not specified.  I discussed this with the patient and he indicates that the first time he got contrast dye felt a flushing sensation without anaphylaxis, throat closure or other allergic signs.  Reports the second time he got iodinated contrast he had no reaction.  Did not have a prep prior to this he says. Due to this, I doubt he actually has any contrast allergy.  I discussed this with CT technicians and they are agreeable.  We will provide some Benadryl ahead of time to preclude any reaction, but I do not see the indication to do the  prolonged multihour preparation with steroids.  Patient is agreeable with this. [DS]  [id number] Reassessed.  Discussed work-up so far.  We discussed hyperglycemia without acidosis.  We discussed elevated lipase and this may be the etiology of his symptoms.  We discussed reassuring CT and plan of care.  We will plan to provide Tylenol, lidocaine patch and reassess after second troponin to help finalize disposition. [DS]  [id number] Reassessed.  Still with some mild persistent pain.  We discussed reassuring second troponin, but he also has quite a bit of cardiac risk factors.  After discussing risks and benefits, we decided upon observation admission for chest pain.  I will consult hospitalist [DS]  [phone] After  I consult with hospitalist for admission, patient calls out requesting discharge. I re-evaluate him. He spoke with his wife over the phone and after they talked he reports feeling less anxious and just wants to leave. Acknowledges possibility of undiagnosed cardiac pathology, says he'll reach out to Dr. Mariah Milling . We thoroughly discussed return precautions. [DS]    Clinical Course User Index [DS] Delton Prairie, MD     FINAL CLINICAL IMPRESSION(S) / ED DIAGNOSES   Final diagnoses:  Other chest pain  Abnormal serum level of lipase     Rx / DC Orders   ED Discharge Orders     None        Note:  This document was prepared using Dragon voice recognition software and may include unintentional dictation errors.   Delton Prairie, MD 12/14/21 Harvie Junior    Delton Prairie, MD 12/14/21 [phone]

## 2021-12-14 NOTE — ED Notes (Signed)
Notable swelling to left upper abdomen, tender to the touch per pt.  Not warm, red, or bruised upon assessment. Pt denies N/V/D or urinary symptoms.

## 2021-12-14 NOTE — ED Triage Notes (Signed)
Pt to ED via POV with c/o LUQ abd pain and CP. Pt states swelling to LUQ abd. Pt also c/o CP, denies radiation. Pt states pain radiations to his back. Pt states possible infection from his insulin pump site.

## 2021-12-15 ENCOUNTER — Telehealth: Payer: Self-pay | Admitting: Family

## 2021-12-15 ENCOUNTER — Ambulatory Visit: Payer: 59 | Admitting: Family

## 2021-12-15 NOTE — Telephone Encounter (Signed)
Attempted to schedule. Lmov  

## 2021-12-15 NOTE — Telephone Encounter (Signed)
Dr Mariah Milling,  I am seeing our mutual patient this afternoon at 4pm and reaching out to you proactively.    Concerned after he was seen in ED yesterday for CP , near the area where his insulin pump is. He had reassuring CT chest and abdomen.   ED physician consulted with hospitalist and he left prior to possible admission for concern for cardiac etiology  I havent seen patient however read ED notes.   Serial troponin's are normal yesterday I dont see acute changes in regard to EKG when compared to prior 06/30/21.   Do you mind having your office reach out to him to schedule an appt as well?   Jenate,  While we await Dr Mariah Milling to see phone note, please call [facility]  [phone] and ask to speak to nurse . Please see when first available appt is.

## 2021-12-15 NOTE — Telephone Encounter (Signed)
Patient called to cancel appointment for this afternoon cause he went to ED on yesterday and felt like he did not need a f/up

## 2021-12-15 NOTE — Telephone Encounter (Signed)
noted 

## 2021-12-16 ENCOUNTER — Ambulatory Visit: Payer: 59 | Admitting: Cardiovascular Disease

## 2021-12-16 ENCOUNTER — Encounter: Payer: Self-pay | Admitting: Cardiovascular Disease

## 2021-12-16 VITALS — BP 112/70 | HR 81 | Ht 72.0 in | Wt 244.1 lb

## 2021-12-16 DIAGNOSIS — R079 Chest pain, unspecified: Secondary | ICD-10-CM

## 2021-12-16 DIAGNOSIS — I25118 Atherosclerotic heart disease of native coronary artery with other forms of angina pectoris: Secondary | ICD-10-CM | POA: Diagnosis not present

## 2021-12-16 DIAGNOSIS — I5042 Chronic combined systolic (congestive) and diastolic (congestive) heart failure: Secondary | ICD-10-CM | POA: Diagnosis not present

## 2021-12-16 NOTE — Patient Instructions (Addendum)
Medication Instructions:  No changes  Ask Dr. Alesia Morin about jardiance/farxiga  If you need a refill on your cardiac medications before your next appointment, please call your pharmacy.   Lab work: No new labs needed  Testing/Procedures: No new testing needed  Follow-Up: At [facility], you and your health needs are our priority.  As part of our continuing mission to provide you with exceptional heart care, we have created designated Provider Care Teams.  These Care Teams include your primary Cardiologist (physician) and Advanced Practice Providers (APPs -  Physician Assistants and Nurse Practitioners) who all work together to provide you with the care you need, when you need it.  You will need a follow up appointment in 6 months  Providers on your designated Care Team:   Nicolasa Ducking, NP Eula Listen, PA-C Cadence Fransico Michael, [place]  COVID-19 Vaccine Information can be found at: [email or web address] For questions related to vaccine distribution or appointments, please email [email or web address] or call [phone].

## 2021-12-16 NOTE — Progress Notes (Signed)
Cardiology Office Note  Date:  12/16/2021   ID:  Zen, Cedillos Feb 18, 1976, MRN [id number]  PCP:  Allegra Grana, FNP   Chief Complaint  Patient presents with   [facility] follow up     Patient c/o some mild shortness of breath, bilateral neck & shoulder pain & upper abdominal pain. Medications reviewed by the patient verbally.     HPI:  [name] is a pleasant 46 year old gentleman with history of CAD, STEMI Stent to LAD 06/2015, initially presented with shoulder pain neck pain, shortness of breath/anginal equivalent DM II, poorly controlled Bipolar/depression HTN Hyperlipidemia Obesity TIA x2 in 2018 Hx of PE Initial ejection fraction down to 20% in early 2017 after STEMI, Treated with warfarin for LV thrombus 6 months EF Up to 40% on follow-up echo 2017 Who presents for management of his coronary disease  Last seen in clinic January 2023 Stress test ordered at that time, was not completed  2 weeks ago, pain in ABD  on left  Tender on palpation Seen in the emergency room as symptoms were not improving December 14, 2021 for abdominal pain Appears to be close to the site of his insulin pump, off by 2 to 3 inches Cardiac work-up negative and was released home  In terms of the left abdominal pain, Can feel in bed, better in certain positions  Reports tolerating Entresto started on last clinic visit Has not been wearing his insulin pump, sugars running high close to 500 Prior lab work reviewed A1C 10.2  EKG personally reviewed by myself on todays visit Normal sinus rhythm rate 81 bpm consider old anterior MI left axis deviation No change from prior EKGs  Other past medical history reviewed Echo 11/22  Ejection fraction 35 to 40%, appears unchanged from 2017 No significant valve disease noted  Tia x2 November 2018 reports that he was Getting an ice cream, Started complaining of arm pain, people reported he was talking off topic, Left arm, left shoulder  numb He went to the hospital, [facility] CT head ok MRI ok Recent carotid ultrasound with no significant disease  Earlier in 2018 also reported having similar symptoms Went to hospital in [place], results not available   left arm numb again No speech problem that time  Details not clear but reports that he was given a medication at the time that he had significant allergy to  Admitted 1/31-07/11/2015 for STEMI. 1 week prior to presentation he had left shoulder pain   treated at urgent care for bursitis with a steroid injection.   In the few days prior to presentation,   EKG with ST elevation. Cath with occluded mid LAD treated with DES. No residual disease.   Echo with ejection fraction 20% with probable LV thrombus. Started on warfarin. Metoprolol succinate, atorvastatin and lisinopril also initiated.   08/29/15 Repeat Cardiac cath, stable , stents patent Echocardiogram with improved ejection fraction (40%) and resolution of LV apical thrombus. Lifevest stopped Warfarin stopped, but he continued to take   followed by endocrinologist, Dr Vincente Liberty with DM nutritionist Bipolar-seen psychiatrist;   Previous echocardiogram March 2017, ejection fraction 40%, up from 19%   PMH:   has a past medical history of Asthma, Bipolar disorder (HCC), CHF (congestive heart failure) (HCC), Chicken pox, Clotting disorder (HCC), Depression, Diabetes mellitus without complication (HCC) (2003), Heart murmur, Hyperlipidemia, Hypertension, Myocardial infarction (HCC) (06/2015), Pulmonary embolism (HCC) (2018), Retinopathy of both eyes (01/2016), SOB (shortness of breath) (08/17/2016), and Stroke (HCC).  PSH:  Past Surgical History:  Procedure Laterality Date   CORONARY ANGIOPLASTY WITH STENT PLACEMENT Left 06/2015   LADx1 stent   WISDOM TOOTH EXTRACTION      Current Outpatient Medications  Medication Sig Dispense Refill   atorvastatin (LIPITOR) 80 MG tablet Take 1 tablet (80 mg total) by  mouth once daily 90 tablet 3   clopidogrel (PLAVIX) 75 MG tablet Take 1 tablet (75 mg total) by mouth daily. 90 tablet 3   ezetimibe (ZETIA) 10 MG tablet Take 1 tablet (10 mg total) by mouth once daily 90 tablet 3   furosemide (LASIX) 20 MG tablet Take 1 tablet (20 mg total) by mouth daily. 30 tablet 3   gabapentin (NEURONTIN) 600 MG tablet Take 3 tablets (1,800 mg total) by mouth at bedtime. 90 tablet 1   insulin lispro (HUMALOG) 100 UNIT/ML injection Take up to 150 units daily via insulin pump 140 mL 3   metoprolol succinate (TOPROL-XL) 100 MG 24 hr tablet Take 1 tablet (100 mg total) by mouth nightly 90 tablet 3   nitroGLYCERIN (NITROSTAT) 0.3 MG SL tablet Place 1 tablet (0.3 mg total) under the tongue every 5 (five) minutes as needed. 25 tablet 3   ondansetron (ZOFRAN) 4 MG tablet Take 1 tablet (4 mg total) by mouth every 8 (eight) hours as needed for nausea or vomiting. 20 tablet 1   rivaroxaban (XARELTO) 2.5 MG TABS tablet TAKE 1 TABLET BY MOUTH TWICE DAILY 180 tablet 3   sacubitril-valsartan (ENTRESTO) 24-26 MG Take 1 tablet by mouth 2 (two) times daily. 180 tablet 3   scopolamine (TRANSDERM-SCOP) 1 MG/3DAYS Place 1 patch (1.5 mg total) onto the skin every 3 (three) days. 10 patch 0   sertraline (ZOLOFT) 50 MG tablet Take 1 tablet (50 mg total) by mouth at bedtime. 90 tablet 3   spironolactone (ALDACTONE) 25 MG tablet TAKE 1/2 TABLET BY MOUTH DAILY EVERY MORNING 45 tablet 3   No current facility-administered medications for this visit.     Allergies:   Metrizamide, Other, Statins, Sulfa antibiotics, Sulfamethoxazole-trimethoprim, Sulfasalazine, Contrast media [iodinated contrast media], and Shrimp (diagnostic)   Social History:  The patient  reports that he has never smoked. He has never used smokeless tobacco. He reports that he does not drink alcohol and does not use drugs.   Family History:   family history includes Alcohol abuse in his father; Arthritis in his maternal grandmother  and mother; Diabetes in his father and maternal grandfather; Heart disease in his father, maternal grandfather, maternal uncle, paternal aunt, and paternal grandmother; Hyperlipidemia in his brother, father, and maternal grandfather; Hypertension in his brother, father, maternal grandmother, and mother; Kidney disease in his maternal grandfather, maternal grandmother, paternal grandfather, and paternal grandmother.    Review of Systems: Review of Systems  Constitutional: Negative.   HENT: Negative.    Respiratory: Negative.    Cardiovascular: Negative.   Gastrointestinal:  Positive for abdominal pain.  Musculoskeletal: Negative.   Neurological: Negative.   Psychiatric/Behavioral: Negative.    All other systems reviewed and are negative.   PHYSICAL EXAM: VS:  BP 112/70 (BP Location: Left Arm, Patient Position: Sitting, Cuff Size: Normal)   Pulse 81   Ht 6' (1.829 m)   Wt 244 lb 2 oz (110.7 kg)   SpO2 98%   BMI 33.11 kg/m  , BMI Body mass index is 33.11 kg/m. Constitutional:  oriented to person, place, and time. No distress.  HENT:  Head: Grossly normal Eyes:  no discharge. No scleral icterus.  Neck: No JVD, no carotid bruits  Cardiovascular: Regular rate and rhythm, no murmurs appreciated Pulmonary/Chest: Clear to auscultation bilaterally, no wheezes or rails Abdominal: Soft.  no distension.  Small region tenderness left upper epigastric area Musculoskeletal: Normal range of motion Neurological:  normal muscle tone. Coordination normal. No atrophy Skin: Skin warm and dry Psychiatric: normal affect, pleasant  Recent Labs: 06/13/2021: B Natriuretic Peptide 43.1 12/14/2021: ALT 23; BUN 23; Creatinine, Ser 1.05; Hemoglobin 14.2; Platelets 223; Potassium 4.3; Sodium 131    Lipid Panel Lab Results  Component Value Date   CHOL 115 11/04/2020   HDL 40 11/04/2020   LDLCALC 54 11/04/2020   TRIG 126 11/04/2020      Wt Readings from Last 3 Encounters:  12/16/21 244 lb 2 oz  (110.7 kg)  06/30/21 250 lb (113.4 kg)  06/30/21 250 lb 9.6 oz (113.7 kg)      ASSESSMENT AND PLAN:  Abdominal discomfort Relatively point tenderness, likely musculoskeletal or local irritation from insulin pump CT scan abdomen pelvis normal with no abscess or signs of infection noted Recommend he discontinue to monitor symptoms for now We will touch base with primary care  Coronary artery disease of native artery of native heart with stable angina pectoris (HCC) -  Tolerating Plavix and Xarelto 2.5 mg po BID No symptoms concerning for angina, left upper epigastric discomfort is atypical in nature A1c above goal, followed by endocrine  Cardiomyopathy, ischemic Continue current medications, recommend he talk with endocrine whether he can add Jardiance or Farxiga  Mixed hyperlipidemia Continue Zetia and Lipitor Cholesterol at goal  TIA He was evaluated in the hospital twice for TIA symptoms Continue Plavix and Xarelto 2.5 twice daily No further episodes  Type 2 diabetes mellitus with other circulatory complication, unspecified whether long term insulin use (HCC) A1c high, managed by endocrine A1c around 10 Consider Jardiance/Farxiga  Bipolar disorder, in partial remission, most recent episode depressed (HCC) Stable managed by primary care  Chronic combined systolic and diastolic CHF (congestive heart failure) (HCC)  Ejection fraction 35-40% March 2017 Recent ejection fraction 35 to 40% Continue metoprolol succinate, spironolactone, continue Entresto,  Consider adding Jardiance/Farxiga if cleared by endocrine    Total encounter time more than 30 minutes  Greater than 50% was spent in counseling and coordination of care with the patient    No orders of the defined types were placed in this encounter.     Signed, Dossie Arbour, M.D., Ph.D. 12/16/2021  [facility] [place], [place] [phone]

## 2021-12-25 ENCOUNTER — Encounter: Payer: Self-pay | Admitting: Family

## 2021-12-25 ENCOUNTER — Other Ambulatory Visit: Payer: Self-pay

## 2021-12-25 MED ORDER — GLUCOSE BLOOD VI STRP
ORAL_STRIP | 3 refills | Status: DC
  Filled 2021-12-25 (×3): qty 100, 30d supply, fill #0

## 2021-12-25 NOTE — Telephone Encounter (Signed)
Test strips have been refilled.

## 2021-12-29 ENCOUNTER — Encounter: Payer: Self-pay | Admitting: Family

## 2021-12-29 ENCOUNTER — Other Ambulatory Visit: Payer: Self-pay

## 2021-12-29 ENCOUNTER — Ambulatory Visit: Payer: 59 | Admitting: Family

## 2021-12-29 VITALS — BP 136/86 | HR 83 | Temp 97.4°F | Ht 72.0 in | Wt 248.8 lb

## 2021-12-29 DIAGNOSIS — F3175 Bipolar disorder, in partial remission, most recent episode depressed: Secondary | ICD-10-CM

## 2021-12-29 DIAGNOSIS — R109 Unspecified abdominal pain: Secondary | ICD-10-CM

## 2021-12-29 DIAGNOSIS — I5042 Chronic combined systolic (congestive) and diastolic (congestive) heart failure: Secondary | ICD-10-CM

## 2021-12-29 DIAGNOSIS — R0602 Shortness of breath: Secondary | ICD-10-CM

## 2021-12-29 DIAGNOSIS — E1159 Type 2 diabetes mellitus with other circulatory complications: Secondary | ICD-10-CM

## 2021-12-29 DIAGNOSIS — I10 Essential (primary) hypertension: Secondary | ICD-10-CM

## 2021-12-29 LAB — POCT GLYCOSYLATED HEMOGLOBIN (HGB A1C): Hemoglobin A1C: 9.9 % — AB (ref 4.0–5.6)

## 2021-12-29 MED ORDER — INSULIN LISPRO (1 UNIT DIAL) 100 UNIT/ML (KWIKPEN)
2.0000 [IU] | PEN_INJECTOR | Freq: Three times a day (TID) | SUBCUTANEOUS | 3 refills | Status: DC | PRN
  Filled 2021-12-29: qty 15, 84d supply, fill #0

## 2021-12-29 MED ORDER — EZETIMIBE 10 MG PO TABS
10.0000 mg | ORAL_TABLET | Freq: Every day | ORAL | 3 refills | Status: DC
  Filled 2021-12-29: qty 90, 90d supply, fill #0
  Filled 2022-03-04 – 2022-05-12 (×2): qty 90, 90d supply, fill #1
  Filled 2022-11-15: qty 90, 90d supply, fill #2

## 2021-12-29 MED ORDER — LIDOCAINE 5 % EX PTCH
1.0000 | MEDICATED_PATCH | CUTANEOUS | 0 refills | Status: DC
  Filled 2021-12-29: qty 30, 30d supply, fill #0

## 2021-12-29 MED ORDER — GLUCOSE BLOOD VI STRP
ORAL_STRIP | 3 refills | Status: DC
  Filled 2021-12-29: qty 100, 30d supply, fill #0
  Filled 2021-12-31: qty 100, 25d supply, fill #0

## 2021-12-29 MED ORDER — ATORVASTATIN CALCIUM 80 MG PO TABS
ORAL_TABLET | ORAL | 3 refills | Status: DC
  Filled 2021-12-29: qty 90, 90d supply, fill #0
  Filled 2022-03-04: qty 90, 90d supply, fill #1
  Filled 2022-05-12 – 2022-07-01 (×2): qty 90, 90d supply, fill #2
  Filled 2022-11-15: qty 90, 90d supply, fill #3

## 2021-12-29 MED ORDER — CLOPIDOGREL BISULFATE 75 MG PO TABS
75.0000 mg | ORAL_TABLET | Freq: Every day | ORAL | 3 refills | Status: DC
  Filled 2021-12-29 – 2022-03-04 (×2): qty 90, 90d supply, fill #0
  Filled 2022-05-12: qty 90, 90d supply, fill #1
  Filled 2022-11-15: qty 90, 90d supply, fill #2

## 2021-12-29 MED ORDER — SERTRALINE HCL 50 MG PO TABS
50.0000 mg | ORAL_TABLET | Freq: Every day | ORAL | 3 refills | Status: DC
  Filled 2021-12-29: qty 90, 90d supply, fill #0
  Filled 2022-03-04: qty 90, 90d supply, fill #1
  Filled 2022-05-12 – 2022-07-01 (×2): qty 90, 90d supply, fill #2
  Filled 2022-11-15: qty 90, 90d supply, fill #3

## 2021-12-29 MED ORDER — SPIRONOLACTONE 25 MG PO TABS
ORAL_TABLET | ORAL | 3 refills | Status: DC
  Filled 2021-12-29 – 2022-03-04 (×2): qty 45, 90d supply, fill #0
  Filled 2022-05-12: qty 45, 90d supply, fill #1
  Filled 2022-11-15: qty 45, 90d supply, fill #2

## 2021-12-29 MED ORDER — FUROSEMIDE 20 MG PO TABS
20.0000 mg | ORAL_TABLET | Freq: Every day | ORAL | 3 refills | Status: DC
  Filled 2021-12-29: qty 90, 90d supply, fill #0
  Filled 2022-03-04: qty 90, 90d supply, fill #1
  Filled 2022-05-12 – 2022-07-01 (×2): qty 90, 90d supply, fill #2
  Filled 2022-11-15: qty 90, 90d supply, fill #3

## 2021-12-29 NOTE — Progress Notes (Unsigned)
Subjective:    Patient ID: Jose Merritt, male    DOB: February 07, 1976, 46 y.o.   MRN: [id number]  CC: Jose Merritt is a 47 y.o. male who presents today for follow up.   HPI: Left abdominal pain tender to touch x 4 weeks. Unchanged.  Rates 3/10 pain. Pain present all the time    No fever, constipation, nausea, hematuria, dysuria, pain with eating  For work he does restrain patients in security.  No known injury  Pain had been proximal to insulin pump however since he has moved insulin pump.  He is not wearing CGM. He is wearing Tandem insulin pump now over right  lower abdomen. He changed insulin pump from left upper abdomen to right lower abdomen. He is following lower carb diet with improvement of glucose.   FBG has improved significantly the past 7 days. He is taking random blood glucose 180-200.  He is using humalog only 4 units per hour , no bolus as prescribed by endocrine. He would like humalog  pen refilled for times if we wasn't wearing insulin pump for which to adhere to SSI which he had previously done.   Seen in ED 12/14/21  CT a/p showed advanced coronary vascular calcification of the LAD.  No aneurysmal dilatation or dissection.  No acute intrathoracic abdominal or pelvic pathology.  No fluid collection or abscess.  Seen by dr Mariah Milling 12/16/21 after ED visit, felt musculoskeletal or local irritation from insulin pump.  Atypical symptoms for angina.  Consider Jardiance or [place].  Last seen by Dr Tedd Sias 11/2020  HISTORY:  Past Medical History:  Diagnosis Date   Asthma    Bipolar disorder (HCC)    CHF (congestive heart failure) (HCC)    Chicken pox    Clotting disorder (HCC)    lung   Depression    Diabetes mellitus without complication (HCC) 2003   Type 2   Heart murmur    as child   Hyperlipidemia    Hypertension    Myocardial infarction (HCC) 06/2015   Pulmonary embolism (HCC) 2018   Retinopathy of both eyes 01/2016   SOB (shortness of breath)  08/17/2016   Stroke [facility])    Past Surgical History:  Procedure Laterality Date   CORONARY ANGIOPLASTY WITH STENT PLACEMENT Left 06/2015   LADx1 stent   WISDOM TOOTH EXTRACTION     Family History  Problem Relation Age of Onset   Hypertension Mother    Arthritis Mother    Heart disease Father        3 heart attacks   Diabetes Father    Hypertension Father    Hyperlipidemia Father    Alcohol abuse Father    Hypertension Brother    Hyperlipidemia Brother    Heart disease Maternal Uncle    Heart disease Paternal Aunt    Hypertension Maternal Grandmother    Kidney disease Maternal Grandmother    Arthritis Maternal Grandmother    Heart disease Maternal Grandfather    Hyperlipidemia Maternal Grandfather    Diabetes Maternal Grandfather    Kidney disease Maternal Grandfather    Heart disease Paternal Grandmother    Kidney disease Paternal Grandmother    Kidney disease Paternal Grandfather    Colon cancer Neg Hx     Allergies: Metrizamide, Other, Statins, Sulfa antibiotics, Sulfamethoxazole-trimethoprim, Sulfasalazine, Contrast media [iodinated contrast media], and Shrimp (diagnostic) Current Outpatient Medications on File Prior to Visit  Medication Sig Dispense Refill   gabapentin (NEURONTIN) 600 MG tablet Take 3  tablets (1,800 mg total) by mouth at bedtime. 90 tablet 1   insulin lispro (HUMALOG) 100 UNIT/ML injection Take up to 150 units daily via insulin pump 140 mL 3   metoprolol succinate (TOPROL-XL) 100 MG 24 hr tablet Take 1 tablet (100 mg total) by mouth nightly 90 tablet 3   nitroGLYCERIN (NITROSTAT) 0.3 MG SL tablet Place 1 tablet (0.3 mg total) under the tongue every 5 (five) minutes as needed. 25 tablet 3   rivaroxaban (XARELTO) 2.5 MG TABS tablet TAKE 1 TABLET BY MOUTH TWICE DAILY 180 tablet 3   sacubitril-valsartan (ENTRESTO) 24-26 MG Take 1 tablet by mouth 2 (two) times daily. 180 tablet 3   No current facility-administered medications on file prior to visit.     Social History   Tobacco Use   Smoking status: Never   Smokeless tobacco: Never  Vaping Use   Vaping Use: Never used  Substance Use Topics   Alcohol use: No    Comment: rare- 2x/year   Drug use: No    Review of Systems  Constitutional:  Negative for chills and fever.  Respiratory:  Negative for cough.   Cardiovascular:  Negative for chest pain and palpitations.  Gastrointestinal:  Positive for abdominal pain (luq). Negative for nausea and vomiting.  Genitourinary:  Negative for dysuria.      Objective:    BP 136/86 (BP Location: Left Arm, Patient Position: Sitting, Cuff Size: Normal)   Pulse 83   Temp (!) 97.4 F (36.3 C) (Oral)   Ht 6' (1.829 m)   Wt 248 lb 12.8 oz (112.9 kg)   SpO2 97%   BMI 33.74 kg/m  BP Readings from Last 3 Encounters:  12/29/21 136/86  12/16/21 112/70  12/14/21 128/75   Wt Readings from Last 3 Encounters:  12/29/21 248 lb 12.8 oz (112.9 kg)  12/16/21 244 lb 2 oz (110.7 kg)  06/30/21 250 lb (113.4 kg)    Physical Exam Vitals reviewed.  Constitutional:      Appearance: Normal appearance. He is well-developed.  Cardiovascular:     Rate and Rhythm: Regular rhythm.     Heart sounds: Normal heart sounds.  Pulmonary:     Effort: Pulmonary effort is normal. No respiratory distress.     Breath sounds: Normal breath sounds. No wheezing, rhonchi or rales.  Abdominal:     General: Bowel sounds are normal. There is no distension.     Palpations: Abdomen is soft. Abdomen is not rigid. There is no fluid wave or mass.     Tenderness: There is abdominal tenderness in the left upper quadrant. There is no guarding or rebound. Negative signs include Murphy's sign and McBurney's sign.       Comments: Subtle asymmetry when patient sitting upright of the left upper abdominal quadrant.  No palpable or well-circumscribed mass with deep palpation.  Mild tenderness on exam in particular focal area as marked with X.  No erythema, increased warmth rash .  Skin is intact.   Skin:    General: Skin is warm and dry.  Neurological:     Mental Status: He is alert.  Psychiatric:        Speech: Speech normal.        Behavior: Behavior normal.        Assessment & Plan:   Problem List Items Addressed This Visit       Endocrine   Type 2 diabetes mellitus with circulatory disorder (HCC) - Primary    Lab Results  Component Value  Date   HGBA1C 9.9 (A) 12/29/2021  Uncontrolled.  Patient has made significant lifestyle changes and following low glycemic diet.  He is using insulin pump as originally managed by endocrine.  Patient has not had recent follow-up with endocrine and emphasized the importance of continued follow-up with endocrine as I do not manage insulin pump.  Discussed once we regain better control with A1c, Jardiance should be consideration.  He requested refill of Humalog pen to be used a sliding scale which I provided for him today. Ordered CGM as well.  Advised him to continue follow-up with endocrine.      Relevant Medications   insulin lispro (HUMALOG) 100 UNIT/ML KwikPen   Other Relevant Orders   POCT HgB A1C (Completed)     Other   Left lateral abdominal pain    Reviewed emergency room visit, Dr. Windell Hummingbird note.  Question if etiology is musculoskeletal ( injury at work?) .  Offered an ultrasound for further evaluation of soft tissue after CT a/p was unrevealing.  Patient politely declines at this time.  He would like to use heat versus ice and a lidocaine patch to see if pain would improve.  He will monitor for any new or worsening symptoms.      Relevant Medications   lidocaine (LIDODERM) 5 %     I have discontinued Vania Rea. Willow's ondansetron and scopolamine. I am also having him start on lidocaine and insulin lispro. Additionally, I am having him maintain his metoprolol succinate, insulin lispro, nitroGLYCERIN, rivaroxaban, gabapentin, and Entresto.   Meds ordered this encounter  Medications   lidocaine  (LIDODERM) 5 %    Sig: Place 1 patch onto the skin daily. Remove & Discard patch within 12 hours.    Dispense:  30 patch    Refill:  0    Order Specific Question:   Supervising Provider    Answer:   Duncan Dull L [[id number]]   insulin lispro (HUMALOG) 100 UNIT/ML KwikPen    Sig: Inject 2-6 Units into the skin 3 (three) times daily between meals as needed. If sugar is 250-300 - give 2 units of humalog insulin  If sugar is 301-350 - give 4 units of humalog insulin  If sugar is 351 or greater - give 6 units.    Check blood sugar before each meal.    Dispense:  15 mL    Refill:  3    Order Specific Question:   Supervising Provider    Answer:   Sherlene Shams [[id number]]    Return precautions given.   Risks, benefits, and alternatives of the medications and treatment plan prescribed today were discussed, and patient expressed understanding.   Education regarding symptom management and diagnosis given to patient on AVS.  Continue to follow with Allegra Grana, FNP for routine health maintenance.   Jose Merritt and I agreed with plan.   Jose Plowman, FNP  I have spent 40  minutes with a patient including precharting, exam, reviewing medical records in ER and cardiology, and discussion plan of care.

## 2021-12-29 NOTE — Patient Instructions (Addendum)
We will call [facility] about CGM  Please check blood sugar fasting , goal between 70-120.   If sugar is 250-300 - give 2 units of humalog insulin   If sugar is 301-350 - give 4 units of humalog insulin   If sugar is 351 or greater - give 6 units.     Check blood sugar before each meal.     Do not give insulin before bed.    Lidocaine patch Heat/ice  Let me know how you are doing.

## 2021-12-29 NOTE — Progress Notes (Signed)
Discussed during OV. Please see OV notes

## 2021-12-31 ENCOUNTER — Other Ambulatory Visit: Payer: Self-pay | Admitting: Family

## 2021-12-31 ENCOUNTER — Other Ambulatory Visit: Payer: Self-pay

## 2021-12-31 MED ORDER — UNIFINE PENTIPS 31G X 5 MM MISC
0 refills | Status: AC
  Filled 2021-12-31: qty 100, fill #0
  Filled 2022-07-01: qty 100, 33d supply, fill #0

## 2021-12-31 MED ORDER — FREESTYLE LITE TEST VI STRP
ORAL_STRIP | 12 refills | Status: DC
  Filled 2021-12-31 – 2022-01-02 (×2): qty 200, 50d supply, fill #0
  Filled 2022-07-01: qty 200, 50d supply, fill #1

## 2021-12-31 MED ORDER — DEXCOM G7 RECEIVER DEVI
3 refills | Status: DC
  Filled 2021-12-31: qty 1, fill #0
  Filled 2022-03-04: qty 1, 30d supply, fill #0
  Filled 2022-07-01: qty 1, fill #0

## 2021-12-31 MED ORDER — DEXCOM G7 SENSOR MISC
3 refills | Status: DC
  Filled 2021-12-31: qty 3, 30d supply, fill #0
  Filled 2022-03-04: qty 3, 30d supply, fill #1
  Filled 2022-04-07 – 2022-11-15 (×5): qty 3, 30d supply, fill #2

## 2021-12-31 MED ORDER — GLUCOSE BLOOD VI STRP
ORAL_STRIP | 3 refills | Status: DC
  Filled 2021-12-31: qty 200, fill #0

## 2021-12-31 MED ORDER — UNIFINE PENTIPS 31G X 5 MM MISC
0 refills | Status: DC
  Filled 2021-12-31: qty 100, 30d supply, fill #0

## 2021-12-31 NOTE — Assessment & Plan Note (Addendum)
Reviewed emergency room visit, Dr. Windell Hummingbird note.  Question if etiology is musculoskeletal ( injury at work?) .  Offered an ultrasound for further evaluation of soft tissue after CT a/p was unrevealing.  Patient politely declines at this time.  He would like to use heat versus ice and a lidocaine patch to see if pain would improve.  He will monitor for any new or worsening symptoms.

## 2021-12-31 NOTE — Assessment & Plan Note (Addendum)
Lab Results  Component Value Date   HGBA1C 9.9 (A) 12/29/2021   Uncontrolled.  Patient has made significant lifestyle changes and following low glycemic diet.  He is using insulin pump as originally managed by endocrine.  Patient has not had recent follow-up with endocrine and emphasized the importance of continued follow-up with endocrine as I do not manage insulin pump.  Discussed once we regain better control with A1c, Jardiance should be consideration.  He requested refill of Humalog pen to be used a sliding scale which I provided for him today. Ordered CGM as well.  Advised him to continue follow-up with endocrine.

## 2022-01-01 ENCOUNTER — Other Ambulatory Visit (HOSPITAL_COMMUNITY): Payer: Self-pay

## 2022-01-02 ENCOUNTER — Other Ambulatory Visit: Payer: Self-pay

## 2022-01-02 MED ORDER — FREESTYLE LANCETS MISC
12 refills | Status: DC
  Filled 2022-01-02: qty 200, 50d supply, fill #0
  Filled 2022-07-01: qty 200, 50d supply, fill #1

## 2022-01-02 MED ORDER — FREESTYLE FREEDOM LITE W/DEVICE KIT
PACK | 0 refills | Status: DC
  Filled 2022-01-02: qty 1, 25d supply, fill #0

## 2022-01-05 ENCOUNTER — Other Ambulatory Visit: Payer: Self-pay

## 2022-01-14 NOTE — Progress Notes (Signed)
LVM to call back to schedule f/up appt.

## 2022-01-19 NOTE — Progress Notes (Signed)
LVM to call back to schedule follow up foor DM appt!

## 2022-02-10 NOTE — Progress Notes (Signed)
LVM to call back to office to schedule DM follow up

## 2022-02-18 DIAGNOSIS — E1165 Type 2 diabetes mellitus with hyperglycemia: Secondary | ICD-10-CM | POA: Diagnosis not present

## 2022-03-04 ENCOUNTER — Other Ambulatory Visit: Payer: Self-pay

## 2022-03-04 ENCOUNTER — Other Ambulatory Visit: Payer: Self-pay | Admitting: Cardiovascular Disease

## 2022-03-04 ENCOUNTER — Other Ambulatory Visit: Payer: Self-pay | Admitting: Family

## 2022-03-04 DIAGNOSIS — E1140 Type 2 diabetes mellitus with diabetic neuropathy, unspecified: Secondary | ICD-10-CM

## 2022-03-04 DIAGNOSIS — Z8673 Personal history of transient ischemic attack (TIA), and cerebral infarction without residual deficits: Secondary | ICD-10-CM

## 2022-03-04 MED ORDER — XARELTO 2.5 MG PO TABS
ORAL_TABLET | Freq: Two times a day (BID) | ORAL | 3 refills | Status: DC
  Filled 2022-03-04: qty 180, 90d supply, fill #0
  Filled 2022-05-12: qty 180, 90d supply, fill #1
  Filled 2022-11-15: qty 180, 90d supply, fill #2

## 2022-03-04 MED ORDER — GABAPENTIN 600 MG PO TABS
1800.0000 mg | ORAL_TABLET | Freq: Every day | ORAL | 1 refills | Status: DC
  Filled 2022-03-04: qty 90, 30d supply, fill #0
  Filled 2022-04-02: qty 90, 30d supply, fill #1

## 2022-03-04 NOTE — Telephone Encounter (Signed)
Prescription refill request for Xarelto received.  Indication: TIA Last office visit: 12/16/21 Rockey Situ)  Weight: 112.9kg Age: 46 Scr: 1.05 (12/14/21)  CrCl: 137.36ml/min  Per Dr Donivan Scull note on 12/16/21  TIA He was evaluated in the hospital twice for TIA symptoms Continue Plavix and Xarelto 2.5 twice daily No further episodes   Appropriate dose and refill sent to requested pharmacy.

## 2022-03-05 ENCOUNTER — Other Ambulatory Visit: Payer: Self-pay

## 2022-03-11 ENCOUNTER — Other Ambulatory Visit: Payer: Self-pay

## 2022-04-02 ENCOUNTER — Other Ambulatory Visit: Payer: Self-pay

## 2022-04-02 DIAGNOSIS — E1159 Type 2 diabetes mellitus with other circulatory complications: Secondary | ICD-10-CM | POA: Diagnosis not present

## 2022-04-02 DIAGNOSIS — E1142 Type 2 diabetes mellitus with diabetic polyneuropathy: Secondary | ICD-10-CM | POA: Diagnosis not present

## 2022-04-02 MED ORDER — INSULIN LISPRO 100 UNIT/ML IJ SOLN
INTRAMUSCULAR | 3 refills | Status: DC
  Filled 2022-04-02: qty 120, 90d supply, fill #0
  Filled 2022-07-01: qty 120, 84d supply, fill #1
  Filled 2022-11-15: qty 120, 84d supply, fill #2

## 2022-04-02 MED ORDER — DEXCOM G6 SENSOR MISC
12 refills | Status: DC
  Filled 2022-04-02 – 2022-04-07 (×2): qty 3, 30d supply, fill #0
  Filled 2022-05-12: qty 3, 30d supply, fill #1
  Filled 2022-06-10 (×2): qty 3, 30d supply, fill #2
  Filled 2022-07-06: qty 3, 30d supply, fill #3
  Filled 2022-08-12 – 2022-11-15 (×2): qty 3, 30d supply, fill #4

## 2022-04-02 MED ORDER — GABAPENTIN 300 MG PO CAPS
300.0000 mg | ORAL_CAPSULE | Freq: Two times a day (BID) | ORAL | 3 refills | Status: DC
  Filled 2022-04-02: qty 360, 90d supply, fill #0

## 2022-04-02 MED ORDER — DEXCOM G6 TRANSMITTER MISC
4 refills | Status: DC
  Filled 2022-04-02 – 2022-04-07 (×2): qty 1, 90d supply, fill #0
  Filled 2022-06-10: qty 1, 90d supply, fill #1
  Filled 2022-11-15: qty 1, 90d supply, fill #2

## 2022-04-03 ENCOUNTER — Other Ambulatory Visit: Payer: Self-pay

## 2022-04-07 ENCOUNTER — Other Ambulatory Visit: Payer: Self-pay

## 2022-05-12 ENCOUNTER — Other Ambulatory Visit: Payer: Self-pay | Admitting: Family

## 2022-05-12 ENCOUNTER — Other Ambulatory Visit: Payer: Self-pay

## 2022-05-12 DIAGNOSIS — E1140 Type 2 diabetes mellitus with diabetic neuropathy, unspecified: Secondary | ICD-10-CM

## 2022-05-13 ENCOUNTER — Other Ambulatory Visit: Payer: Self-pay

## 2022-05-13 MED ORDER — GABAPENTIN 600 MG PO TABS
1800.0000 mg | ORAL_TABLET | Freq: Every day | ORAL | 1 refills | Status: DC
  Filled 2022-05-13: qty 90, 30d supply, fill #0
  Filled 2022-06-10: qty 90, 30d supply, fill #1

## 2022-05-13 MED ORDER — METOPROLOL SUCCINATE ER 100 MG PO TB24
100.0000 mg | ORAL_TABLET | Freq: Every evening | ORAL | 3 refills | Status: DC
  Filled 2022-05-13: qty 90, 90d supply, fill #0
  Filled 2022-08-12: qty 90, 90d supply, fill #1
  Filled 2022-11-15: qty 90, 90d supply, fill #2

## 2022-05-14 ENCOUNTER — Other Ambulatory Visit: Payer: Self-pay

## 2022-05-15 ENCOUNTER — Other Ambulatory Visit: Payer: Self-pay

## 2022-05-15 DIAGNOSIS — E1165 Type 2 diabetes mellitus with hyperglycemia: Secondary | ICD-10-CM | POA: Diagnosis not present

## 2022-05-18 ENCOUNTER — Other Ambulatory Visit: Payer: Self-pay

## 2022-06-10 ENCOUNTER — Other Ambulatory Visit: Payer: Self-pay

## 2022-06-11 ENCOUNTER — Other Ambulatory Visit: Payer: Self-pay

## 2022-06-18 ENCOUNTER — Other Ambulatory Visit: Payer: Self-pay

## 2022-06-22 ENCOUNTER — Other Ambulatory Visit: Payer: Self-pay

## 2022-06-29 ENCOUNTER — Other Ambulatory Visit: Payer: Self-pay

## 2022-07-01 ENCOUNTER — Other Ambulatory Visit: Payer: Self-pay

## 2022-07-01 DIAGNOSIS — D225 Melanocytic nevi of trunk: Secondary | ICD-10-CM | POA: Diagnosis not present

## 2022-07-01 DIAGNOSIS — D2262 Melanocytic nevi of left upper limb, including shoulder: Secondary | ICD-10-CM | POA: Diagnosis not present

## 2022-07-01 DIAGNOSIS — L821 Other seborrheic keratosis: Secondary | ICD-10-CM | POA: Diagnosis not present

## 2022-07-01 DIAGNOSIS — D2272 Melanocytic nevi of left lower limb, including hip: Secondary | ICD-10-CM | POA: Diagnosis not present

## 2022-07-01 DIAGNOSIS — D2261 Melanocytic nevi of right upper limb, including shoulder: Secondary | ICD-10-CM | POA: Diagnosis not present

## 2022-07-01 DIAGNOSIS — L814 Other melanin hyperpigmentation: Secondary | ICD-10-CM | POA: Diagnosis not present

## 2022-07-01 DIAGNOSIS — I872 Venous insufficiency (chronic) (peripheral): Secondary | ICD-10-CM | POA: Diagnosis not present

## 2022-07-01 DIAGNOSIS — D2271 Melanocytic nevi of right lower limb, including hip: Secondary | ICD-10-CM | POA: Diagnosis not present

## 2022-07-02 ENCOUNTER — Other Ambulatory Visit: Payer: Self-pay

## 2022-07-06 ENCOUNTER — Other Ambulatory Visit: Payer: Self-pay

## 2022-07-15 ENCOUNTER — Other Ambulatory Visit: Payer: Self-pay

## 2022-07-15 ENCOUNTER — Other Ambulatory Visit: Payer: Self-pay | Admitting: Family

## 2022-07-15 DIAGNOSIS — E1140 Type 2 diabetes mellitus with diabetic neuropathy, unspecified: Secondary | ICD-10-CM

## 2022-07-15 MED ORDER — GABAPENTIN 600 MG PO TABS
1800.0000 mg | ORAL_TABLET | Freq: Every day | ORAL | 0 refills | Status: DC
  Filled 2022-07-15 – 2022-07-20 (×2): qty 90, 30d supply, fill #0

## 2022-07-20 ENCOUNTER — Other Ambulatory Visit: Payer: Self-pay

## 2022-07-30 DIAGNOSIS — E1159 Type 2 diabetes mellitus with other circulatory complications: Secondary | ICD-10-CM | POA: Diagnosis not present

## 2022-08-06 ENCOUNTER — Other Ambulatory Visit: Payer: Self-pay

## 2022-08-06 DIAGNOSIS — E1165 Type 2 diabetes mellitus with hyperglycemia: Secondary | ICD-10-CM | POA: Diagnosis not present

## 2022-08-06 DIAGNOSIS — E1159 Type 2 diabetes mellitus with other circulatory complications: Secondary | ICD-10-CM | POA: Diagnosis not present

## 2022-08-06 DIAGNOSIS — E1142 Type 2 diabetes mellitus with diabetic polyneuropathy: Secondary | ICD-10-CM | POA: Diagnosis not present

## 2022-08-06 DIAGNOSIS — Z9641 Presence of insulin pump (external) (internal): Secondary | ICD-10-CM | POA: Diagnosis not present

## 2022-08-06 MED ORDER — DEXCOM G7 SENSOR MISC
1.0000 | 3 refills | Status: DC
  Filled 2022-08-06: qty 9, 90d supply, fill #0

## 2022-08-06 MED ORDER — EZETIMIBE 10 MG PO TABS
10.0000 mg | ORAL_TABLET | Freq: Every day | ORAL | 3 refills | Status: DC
  Filled 2022-08-06: qty 90, 90d supply, fill #0
  Filled 2022-11-15: qty 90, 90d supply, fill #1

## 2022-08-11 DIAGNOSIS — E1165 Type 2 diabetes mellitus with hyperglycemia: Secondary | ICD-10-CM | POA: Diagnosis not present

## 2022-08-12 ENCOUNTER — Other Ambulatory Visit: Payer: Self-pay

## 2022-09-08 ENCOUNTER — Other Ambulatory Visit: Payer: Self-pay

## 2022-09-08 ENCOUNTER — Encounter: Payer: Self-pay | Admitting: Family

## 2022-09-08 ENCOUNTER — Other Ambulatory Visit: Payer: Self-pay | Admitting: Family

## 2022-09-08 DIAGNOSIS — T753XXA Motion sickness, initial encounter: Secondary | ICD-10-CM

## 2022-09-08 MED ORDER — SCOPOLAMINE 1 MG/3DAYS TD PT72
1.0000 | MEDICATED_PATCH | TRANSDERMAL | 0 refills | Status: DC
  Filled 2022-09-08: qty 10, 30d supply, fill #0

## 2022-09-23 ENCOUNTER — Other Ambulatory Visit: Payer: Self-pay

## 2022-11-15 ENCOUNTER — Other Ambulatory Visit: Payer: Self-pay | Admitting: Cardiovascular Disease

## 2022-11-15 ENCOUNTER — Other Ambulatory Visit: Payer: Self-pay | Admitting: Family

## 2022-11-15 DIAGNOSIS — E1140 Type 2 diabetes mellitus with diabetic neuropathy, unspecified: Secondary | ICD-10-CM

## 2022-11-16 ENCOUNTER — Telehealth: Payer: Self-pay | Admitting: Cardiovascular Disease

## 2022-11-16 ENCOUNTER — Other Ambulatory Visit: Payer: Self-pay

## 2022-11-16 MED ORDER — ENTRESTO 24-26 MG PO TABS
1.0000 | ORAL_TABLET | Freq: Two times a day (BID) | ORAL | 0 refills | Status: DC
  Filled 2022-11-16 – 2022-11-17 (×2): qty 60, 30d supply, fill #0

## 2022-11-16 MED ORDER — GABAPENTIN 600 MG PO TABS
1800.0000 mg | ORAL_TABLET | Freq: Every day | ORAL | 0 refills | Status: DC
  Filled 2022-11-16: qty 90, 30d supply, fill #0

## 2022-11-16 NOTE — Telephone Encounter (Signed)
Left voice mail to schedule 12 month follow up appointment. 

## 2022-11-16 NOTE — Telephone Encounter (Signed)
Good Morning,  Could you schedule an overdue 6 month follow up visit? Patient was last seen by Dr. Mariah Milling on 12-16-21. Thank you so much.

## 2022-11-16 NOTE — Telephone Encounter (Signed)
Pt scheduled on 6/25

## 2022-11-17 ENCOUNTER — Other Ambulatory Visit: Payer: Self-pay

## 2022-11-18 DIAGNOSIS — E1165 Type 2 diabetes mellitus with hyperglycemia: Secondary | ICD-10-CM | POA: Diagnosis not present

## 2022-11-22 NOTE — Progress Notes (Deleted)
Cardiology Office Note  Date:  11/22/2022   ID:  Jose Merritt, Jose Merritt 03/07/1976, MRN [id number]  PCP:  Allegra Grana, FNP   No chief complaint on file.   HPI:  Jose Merritt is a pleasant 47 year old gentleman with history of CAD, STEMI Stent to LAD 06/2015, initially presented with shoulder pain neck pain, shortness of breath/anginal equivalent DM II, poorly controlled Bipolar/depression HTN Hyperlipidemia Obesity TIA x2 in 2018 Hx of PE Initial ejection fraction down to 20% in early 2017 after STEMI, Treated with warfarin for LV thrombus 6 months EF Up to 40% on follow-up echo 2017 Who presents for management of his coronary disease  Last seen by myself in clinic July 2023   2 weeks ago, pain in ABD  on left  Tender on palpation Seen in the emergency room as symptoms were not improving December 14, 2021 for abdominal pain Appears to be close to the site of his insulin pump, off by 2 to 3 inches Cardiac work-up negative and was released home  In terms of the left abdominal pain, Can feel in bed, better in certain positions  Reports tolerating Entresto started on last clinic visit Has not been wearing his insulin pump, sugars running high close to 500 Prior lab work reviewed A1C 10.2  EKG personally reviewed by myself on todays visit Normal sinus rhythm rate 81 bpm consider old anterior MI left axis deviation No change from prior EKGs  Other past medical history reviewed Echo 11/22  Ejection fraction 35 to 40%, appears unchanged from 2017 No significant valve disease noted  Tia x2 November 2018 reports that he was Getting an ice cream, Started complaining of arm pain, people reported he was talking off topic, Left arm, left shoulder numb He went to the hospital, [facility] CT head ok MRI ok Recent carotid ultrasound with no significant disease  Earlier in 2018 also reported having similar symptoms Went to hospital in [place], results not available    left arm numb again No speech problem that time  Details not clear but reports that he was given a medication at the time that he had significant allergy to  Admitted 1/31-07/11/2015 for STEMI. 1 week prior to presentation he had left shoulder pain   treated at urgent care for bursitis with a steroid injection.   In the few days prior to presentation,   EKG with ST elevation. Cath with occluded mid LAD treated with DES. No residual disease.   Echo with ejection fraction 20% with probable LV thrombus. Started on warfarin. Metoprolol succinate, atorvastatin and lisinopril also initiated.   08/29/15 Repeat Cardiac cath, stable , stents patent Echocardiogram with improved ejection fraction (40%) and resolution of LV apical thrombus. Lifevest stopped Warfarin stopped, but he continued to take   followed by endocrinologist, Dr Vincente Liberty with DM nutritionist Bipolar-seen psychiatrist;   Previous echocardiogram March 2017, ejection fraction 40%, up from 19%   PMH:   has a past medical history of Asthma, Bipolar disorder (HCC), CHF (congestive heart failure) (HCC), Chicken pox, Clotting disorder (HCC), Depression, Diabetes mellitus without complication (HCC) (2003), Heart murmur, Hyperlipidemia, Hypertension, Myocardial infarction (HCC) (06/2015), Pulmonary embolism (HCC) (2018), Retinopathy of both eyes (01/2016), SOB (shortness of breath) (08/17/2016), and Stroke (HCC).  PSH:    Past Surgical History:  Procedure Laterality Date   CORONARY ANGIOPLASTY WITH STENT PLACEMENT Left 06/2015   LADx1 stent   WISDOM TOOTH EXTRACTION      Current Outpatient Medications  Medication Sig Dispense Refill  glucose blood (FREESTYLE LITE) test strip Used in conjunction with insulin pump to check blood sugars 4 x daily. 200 each 12   atorvastatin (LIPITOR) 80 MG tablet Take 1 tablet (80 mg total) by mouth once daily 90 tablet 3   Blood Glucose Monitoring Suppl (FREESTYLE FREEDOM LITE) w/Device KIT  Used in conjunction with insulin pump to check blood sugars 4 x daily. 1 kit 0   clopidogrel (PLAVIX) 75 MG tablet Take 1 tablet (75 mg total) by mouth daily. 90 tablet 3   Continuous Blood Gluc Receiver (DEXCOM G7 RECEIVER) DEVI Used to check blood sugars. 1 each 3   Continuous Glucose Sensor (DEXCOM G6 SENSOR) MISC Use to monitor blood sugar.  Replace every 10 days. NDC [phone]. 3 each 12   Continuous Glucose Sensor (DEXCOM G7 SENSOR) MISC Used to check blood sugars every 10 days. 3 each 3   Continuous Blood Gluc Sensor (DEXCOM G7 SENSOR) MISC Use 1 sensor every 10 days as directed. 9 each 3   Continuous Glucose Transmitter (DEXCOM G6 TRANSMITTER) MISC Use to monitor blood sugar.  Replace every 3 months. NDC [phone]. 1 each 4   ezetimibe (ZETIA) 10 MG tablet Take 1 tablet (10 mg total) by mouth once daily 90 tablet 3   ezetimibe (ZETIA) 10 MG tablet Take 1 tablet (10 mg total) by mouth once daily 90 tablet 3   furosemide (LASIX) 20 MG tablet Take 1 tablet (20 mg total) by mouth daily. 90 tablet 3   gabapentin (NEURONTIN) 600 MG tablet Take 3 tablets (1,800 mg total) by mouth at bedtime. 90 tablet 0   insulin lispro (HUMALOG) 100 UNIT/ML injection Take up to 150 units daily via insulin pump 140 mL 3   insulin lispro (HUMALOG) 100 UNIT/ML injection Take up to 150 units daily via insulin pump 140 mL 3   insulin lispro (HUMALOG) 100 UNIT/ML KwikPen Inject 2-6 Units into the skin 3 (three) times daily between meals as needed. If sugar is 250-300 - give 2 units of humalog insulin  If sugar is 301-350 - give 4 units of humalog insulin  If sugar is 351 or greater - give 6 units.    Check blood sugar before each meal. 15 mL 3   Insulin Pen Needle (UNIFINE PENTIPS) 31G X 5 MM MISC Use as directed with Humalog 3 times a day 100 each 0   Lancets (FREESTYLE) lancets Used in conjunction with insulin pump to check blood sugars 4 x daily. 200 each 12   lidocaine (LIDODERM) 5 % Place 1 patch onto the  skin daily. Remove & Discard patch within 12 hours. 30 patch 0   metoprolol succinate (TOPROL-XL) 100 MG 24 hr tablet Take 1 tablet (100 mg total) by mouth nightly. 90 tablet 3   nitroGLYCERIN (NITROSTAT) 0.3 MG SL tablet Place 1 tablet (0.3 mg total) under the tongue every 5 (five) minutes as needed. 25 tablet 3   rivaroxaban (XARELTO) 2.5 MG TABS tablet TAKE 1 TABLET BY MOUTH TWICE DAILY 180 tablet 3   sacubitril-valsartan (ENTRESTO) 24-26 MG Take 1 tablet by mouth 2 (two) times daily. 60 tablet 0   scopolamine (TRANSDERM-SCOP) 1 MG/3DAYS Place 1 patch (1.5 mg total) onto the skin every 3 (three) days. 10 patch 0   sertraline (ZOLOFT) 50 MG tablet Take 1 tablet (50 mg total) by mouth at bedtime. 90 tablet 3   spironolactone (ALDACTONE) 25 MG tablet TAKE 1/2 TABLET BY MOUTH DAILY EVERY MORNING 45 tablet 3   No current  facility-administered medications for this visit.     Allergies:   Metrizamide, Other, Statins, Sulfa antibiotics, Sulfamethoxazole-trimethoprim, Sulfasalazine, Contrast media [iodinated contrast media], and Shrimp (diagnostic)   Social History:  The patient  reports that he has never smoked. He has never used smokeless tobacco. He reports that he does not drink alcohol and does not use drugs.   Family History:   family history includes Alcohol abuse in his father; Arthritis in his maternal grandmother and mother; Diabetes in his father and maternal grandfather; Heart disease in his father, maternal grandfather, maternal uncle, paternal aunt, and paternal grandmother; Hyperlipidemia in his brother, father, and maternal grandfather; Hypertension in his brother, father, maternal grandmother, and mother; Kidney disease in his maternal grandfather, maternal grandmother, paternal grandfather, and paternal grandmother.    Review of Systems: Review of Systems  Constitutional: Negative.   HENT: Negative.    Respiratory: Negative.    Cardiovascular: Negative.   Gastrointestinal:   Positive for abdominal pain.  Musculoskeletal: Negative.   Neurological: Negative.   Psychiatric/Behavioral: Negative.    All other systems reviewed and are negative.   PHYSICAL EXAM: VS:  There were no vitals taken for this visit. , BMI There is no height or weight on file to calculate BMI. Constitutional:  oriented to person, place, and time. No distress.  HENT:  Head: Grossly normal Eyes:  no discharge. No scleral icterus.  Neck: No JVD, no carotid bruits  Cardiovascular: Regular rate and rhythm, no murmurs appreciated Pulmonary/Chest: Clear to auscultation bilaterally, no wheezes or rails Abdominal: Soft.  no distension.  Small region tenderness left upper epigastric area Musculoskeletal: Normal range of motion Neurological:  normal muscle tone. Coordination normal. No atrophy Skin: Skin warm and dry Psychiatric: normal affect, pleasant  Recent Labs: 12/14/2021: ALT 23; BUN 23; Creatinine, Ser 1.05; Hemoglobin 14.2; Platelets 223; Potassium 4.3; Sodium 131    Lipid Panel Lab Results  Component Value Date   CHOL 115 11/04/2020   HDL 40 11/04/2020   LDLCALC 54 11/04/2020   TRIG 126 11/04/2020      Wt Readings from Last 3 Encounters:  12/29/21 248 lb 12.8 oz (112.9 kg)  12/16/21 244 lb 2 oz (110.7 kg)  06/30/21 250 lb (113.4 kg)      ASSESSMENT AND PLAN:  Abdominal discomfort Relatively point tenderness, likely musculoskeletal or local irritation from insulin pump CT scan abdomen pelvis normal with no abscess or signs of infection noted Recommend he discontinue to monitor symptoms for now We will touch base with primary care  Coronary artery disease of native artery of native heart with stable angina pectoris (HCC) -  Tolerating Plavix and Xarelto 2.5 mg po BID No symptoms concerning for angina, left upper epigastric discomfort is atypical in nature A1c above goal, followed by endocrine  Cardiomyopathy, ischemic Continue current medications, recommend he talk  with endocrine whether he can add Jardiance or [place]  Mixed hyperlipidemia Continue Zetia and Lipitor Cholesterol at goal  TIA He was evaluated in the hospital twice for TIA symptoms Continue Plavix and Xarelto 2.5 twice daily No further episodes  Type 2 diabetes mellitus with other circulatory complication, unspecified whether long term insulin use (HCC) A1c high, managed by endocrine A1c around 10 Consider Jardiance/Farxiga  Bipolar disorder, in partial remission, most recent episode depressed (HCC) Stable managed by primary care  Chronic combined systolic and diastolic CHF (congestive heart failure) (HCC)  Ejection fraction 35-40% March 2017 Recent ejection fraction 35 to 40% Continue metoprolol succinate, spironolactone, continue Entresto,  Consider  adding Jardiance/Farxiga if cleared by endocrine    Total encounter time more than 30 minutes  Greater than 50% was spent in counseling and coordination of care with the patient    No orders of the defined types were placed in this encounter.     Signed, Dossie Arbour, M.D., Ph.D. 11/22/2022  [facility] [place], [place] [phone]

## 2022-11-24 ENCOUNTER — Ambulatory Visit: Payer: Commercial Managed Care - PPO | Admitting: Cardiovascular Disease

## 2022-11-24 DIAGNOSIS — E1159 Type 2 diabetes mellitus with other circulatory complications: Secondary | ICD-10-CM

## 2022-11-24 DIAGNOSIS — Z8673 Personal history of transient ischemic attack (TIA), and cerebral infarction without residual deficits: Secondary | ICD-10-CM

## 2022-11-24 DIAGNOSIS — R0602 Shortness of breath: Secondary | ICD-10-CM

## 2022-11-24 DIAGNOSIS — I10 Essential (primary) hypertension: Secondary | ICD-10-CM

## 2022-11-24 DIAGNOSIS — R072 Precordial pain: Secondary | ICD-10-CM

## 2022-11-24 DIAGNOSIS — E782 Mixed hyperlipidemia: Secondary | ICD-10-CM

## 2022-11-24 DIAGNOSIS — I25118 Atherosclerotic heart disease of native coronary artery with other forms of angina pectoris: Secondary | ICD-10-CM

## 2022-11-24 DIAGNOSIS — R079 Chest pain, unspecified: Secondary | ICD-10-CM

## 2022-11-24 DIAGNOSIS — I5042 Chronic combined systolic (congestive) and diastolic (congestive) heart failure: Secondary | ICD-10-CM

## 2022-11-24 DIAGNOSIS — I209 Angina pectoris, unspecified: Secondary | ICD-10-CM

## 2022-12-02 ENCOUNTER — Other Ambulatory Visit: Payer: Self-pay

## 2022-12-07 ENCOUNTER — Telehealth: Payer: Self-pay | Admitting: Emergency Medicine

## 2022-12-07 NOTE — Telephone Encounter (Signed)
-----   Message from Lauren B [employer] sent at 12/07/2022 11:14 AM EDT ----- Hey, Jose Merritt wants to know what medications he should be taking. He also isn't sure if he should be taking Xarelto at all. Please advise.  

## 2022-12-07 NOTE — Telephone Encounter (Signed)
-----   Message from Maceo [employer] sent at 12/07/2022 11:14 AM EDT ----- [name] wants to know what medications he should be taking. He also isn't sure if he should be taking Xarelto at all. Please advise.

## 2022-12-07 NOTE — Telephone Encounter (Signed)
Called and spoke with patient. Reviewed the medication list with patient. Patient verbalizes understanding.

## 2023-01-06 DIAGNOSIS — D2261 Melanocytic nevi of right upper limb, including shoulder: Secondary | ICD-10-CM | POA: Diagnosis not present

## 2023-01-06 DIAGNOSIS — L821 Other seborrheic keratosis: Secondary | ICD-10-CM | POA: Diagnosis not present

## 2023-01-06 DIAGNOSIS — D2272 Melanocytic nevi of left lower limb, including hip: Secondary | ICD-10-CM | POA: Diagnosis not present

## 2023-01-06 DIAGNOSIS — D2262 Melanocytic nevi of left upper limb, including shoulder: Secondary | ICD-10-CM | POA: Diagnosis not present

## 2023-01-06 DIAGNOSIS — L814 Other melanin hyperpigmentation: Secondary | ICD-10-CM | POA: Diagnosis not present

## 2023-01-06 DIAGNOSIS — D2271 Melanocytic nevi of right lower limb, including hip: Secondary | ICD-10-CM | POA: Diagnosis not present

## 2023-01-06 DIAGNOSIS — D225 Melanocytic nevi of trunk: Secondary | ICD-10-CM | POA: Diagnosis not present

## 2023-01-28 NOTE — Progress Notes (Signed)
Cardiology Office Note  Date:  01/29/2023   ID:  Lakeland, Luskey [birth year], MRN [id number]  PCP:  Allegra Grana, FNP   Chief Complaint  Patient presents with   Follow-up    Patient denies new or acute cardiac problems/concerns today.      HPI:  [name] is a pleasant 47 year old gentleman with history of CAD, STEMI Stent to LAD 06/2015, initially presented with shoulder pain neck pain, shortness of breath/anginal equivalent DM II, poorly controlled Bipolar/depression HTN Hyperlipidemia Obesity TIA x2 in 2018 Hx of PE Initial ejection fraction down to 20% in early 2017 after STEMI, Treated with warfarin for LV thrombus 6 months EF Up to 40% on follow-up echo 2017 Who presents for management of his coronary disease  Last seen in clinic July 2023  In follow-up reports doing relatively well Chronic mild shortness of breath on exertion No regular exercise program Wife exercises on a regular basis, he does not join her  Reports having chronic neuropathy in legs Lab work reviewed A1C 8.1 Total chol 115, LDL 54  Reports tolerating Entresto started on last clinic visit Has not been wearing his insulin pump, sugars running high close to 500 Prior lab work reviewed A1C 10.2  Tolerating spironolactone 12.5 daily, Entresto low-dose, metoprolol succinate 100 daily, Lasix as needed  EKG personally reviewed by myself on todays visit EKG Interpretation Date/Time:  Friday January 29 2023 11:31:45 EDT Ventricular Rate:  85 PR Interval:  208 QRS Duration:  88 QT Interval:  360 QTC Calculation: 428 R Axis:   -27  Text Interpretation: Normal sinus rhythm Anteroseptal infarct (cited on or before 30-Apr-2017) When compared with ECG of 14-Dec-2021 02:00, Questionable change in initial forces of Anterior leads Nonspecific T wave abnormality no longer evident in Anterior leads Confirmed by Julien Nordmann [phone]) on 01/29/2023 5:52:22 PM    Other past medical  history reviewed Echo 11/22  Ejection fraction 35 to 40%, appears unchanged from 2017 No significant valve disease noted  Tia x2 November 2018 reports that he was Getting an ice cream, Started complaining of arm pain, people reported he was talking off topic, Left arm, left shoulder numb He went to the hospital, [facility] CT head ok MRI ok Recent carotid ultrasound with no significant disease  Earlier in 2018 also reported having similar symptoms Went to hospital in [place], results not available   left arm numb again No speech problem that time  Details not clear but reports that he was given a medication at the time that he had significant allergy to  Admitted 1/31-07/11/2015 for STEMI. 1 week prior to presentation he had left shoulder pain   treated at urgent care for bursitis with a steroid injection.   In the few days prior to presentation,   EKG with ST elevation. Cath with occluded mid LAD treated with DES. No residual disease.   Echo with ejection fraction 20% with probable LV thrombus. Started on warfarin. Metoprolol succinate, atorvastatin and lisinopril also initiated.   08/29/15 Repeat Cardiac cath, stable , stents patent Echocardiogram with improved ejection fraction (40%) and resolution of LV apical thrombus. Lifevest stopped Warfarin stopped, but he continued to take   followed by endocrinologist, Dr Vincente Liberty with DM nutritionist Bipolar-seen psychiatrist;   Previous echocardiogram March 2017, ejection fraction 40%, up from 19%   PMH:   has a past medical history of Asthma, Bipolar disorder (HCC), CHF (congestive heart failure) (HCC), Chicken pox, Clotting disorder (HCC), Depression, Diabetes mellitus without complication (HCC) (2003),  Heart murmur, Hyperlipidemia, Hypertension, Myocardial infarction (HCC) (06/2015), Pulmonary embolism (HCC) (2018), Retinopathy of both eyes (01/2016), SOB (shortness of breath) (08/17/2016), and Stroke (HCC).  PSH:     Past Surgical History:  Procedure Laterality Date   CORONARY ANGIOPLASTY WITH STENT PLACEMENT Left 06/2015   LADx1 stent   WISDOM TOOTH EXTRACTION      Current Outpatient Medications  Medication Sig Dispense Refill   atorvastatin (LIPITOR) 80 MG tablet Take 1 tablet (80 mg total) by mouth once daily 90 tablet 3   clopidogrel (PLAVIX) 75 MG tablet Take 1 tablet (75 mg total) by mouth daily. 90 tablet 3   Continuous Glucose Sensor (DEXCOM G6 SENSOR) MISC Use to monitor blood sugar.  Replace every 10 days. NDC [phone]. 3 each 12   gabapentin (NEURONTIN) 600 MG tablet Take 3 tablets (1,800 mg total) by mouth at bedtime. 90 tablet 0   glucose blood (FREESTYLE LITE) test strip Used in conjunction with insulin pump to check blood sugars 4 x daily. (Patient not taking: Reported on 01/29/2023) 200 each 12   insulin lispro (HUMALOG) 100 UNIT/ML injection Take up to 150 units daily via insulin pump 140 mL 3   Lancets (FREESTYLE) lancets Used in conjunction with insulin pump to check blood sugars 4 x daily. 200 each 12   nitroGLYCERIN (NITROSTAT) 0.3 MG SL tablet Place 1 tablet (0.3 mg total) under the tongue every 5 (five) minutes as needed. 25 tablet 3   sertraline (ZOLOFT) 50 MG tablet Take 1 tablet (50 mg total) by mouth at bedtime. 90 tablet 3   Blood Glucose Monitoring Suppl (FREESTYLE FREEDOM LITE) w/Device KIT Used in conjunction with insulin pump to check blood sugars 4 x daily. (Patient not taking: Reported on 01/29/2023) 1 kit 0   Continuous Blood Gluc Receiver (DEXCOM G7 RECEIVER) DEVI Used to check blood sugars. (Patient not taking: Reported on 01/29/2023) 1 each 3   Continuous Glucose Sensor (DEXCOM G7 SENSOR) MISC Used to check blood sugars every 10 days. (Patient not taking: Reported on 01/29/2023) 3 each 3   Continuous Blood Gluc Sensor (DEXCOM G7 SENSOR) MISC Use 1 sensor every 10 days as directed. (Patient not taking: Reported on 01/29/2023) 9 each 3   Continuous Glucose Transmitter  (DEXCOM G6 TRANSMITTER) MISC Use to monitor blood sugar.  Replace every 3 months. NDC [phone]. (Patient not taking: Reported on 01/29/2023) 1 each 4   ezetimibe (ZETIA) 10 MG tablet Take 1 tablet (10 mg total) by mouth once daily 90 tablet 3   furosemide (LASIX) 20 MG tablet Take 1 tablet (20 mg total) by mouth daily. 90 tablet 3   insulin lispro (HUMALOG) 100 UNIT/ML injection Take up to 150 units daily via insulin pump (Patient not taking: Reported on 01/29/2023) 140 mL 3   insulin lispro (HUMALOG) 100 UNIT/ML KwikPen Inject 2-6 Units into the skin 3 (three) times daily between meals as needed. If sugar is 250-300 - give 2 units of humalog insulin  If sugar is 301-350 - give 4 units of humalog insulin  If sugar is 351 or greater - give 6 units.    Check blood sugar before each meal. (Patient not taking: Reported on 01/29/2023) 15 mL 3   Insulin Pen Needle (UNIFINE PENTIPS) 31G X 5 MM MISC Use as directed with Humalog 3 times a day (Patient not taking: Reported on 01/29/2023) 100 each 0   lidocaine (LIDODERM) 5 % Place 1 patch onto the skin daily. Remove & Discard patch within 12 hours. (Patient  not taking: Reported on 01/29/2023) 30 patch 0   metoprolol succinate (TOPROL-XL) 100 MG 24 hr tablet Take 1 tablet (100 mg total) by mouth nightly. 90 tablet 3   rivaroxaban (XARELTO) 2.5 MG TABS tablet TAKE 1 TABLET BY MOUTH TWICE DAILY 180 tablet 3   sacubitril-valsartan (ENTRESTO) 24-26 MG Take 1 tablet by mouth 2 (two) times daily. 180 tablet 3   scopolamine (TRANSDERM-SCOP) 1 MG/3DAYS Place 1 patch (1.5 mg total) onto the skin every 3 (three) days. 10 patch 0   spironolactone (ALDACTONE) 25 MG tablet TAKE 1/2 TABLET BY MOUTH DAILY EVERY MORNING 45 tablet 3   No current facility-administered medications for this visit.    Allergies:   Metrizamide, Other, Statins, Sulfa antibiotics, Sulfamethoxazole-trimethoprim, Sulfasalazine, Contrast media [iodinated contrast media], and Shrimp (diagnostic)    Social History:  The patient  reports that he has never smoked. He has never used smokeless tobacco. He reports that he does not drink alcohol and does not use drugs.   Family History:   family history includes Alcohol abuse in his father; Arthritis in his maternal grandmother and mother; Diabetes in his father and maternal grandfather; Heart disease in his father, maternal grandfather, maternal uncle, paternal aunt, and paternal grandmother; Hyperlipidemia in his brother, father, and maternal grandfather; Hypertension in his brother, father, maternal grandmother, and mother; Kidney disease in his maternal grandfather, maternal grandmother, paternal grandfather, and paternal grandmother.    Review of Systems: Review of Systems  Constitutional: Negative.   HENT: Negative.    Respiratory: Negative.    Cardiovascular: Negative.   Gastrointestinal:  Positive for abdominal pain.  Musculoskeletal: Negative.   Neurological: Negative.   Psychiatric/Behavioral: Negative.    All other systems reviewed and are negative.   PHYSICAL EXAM: VS:  BP 108/70 (BP Location: Left Arm, Patient Position: Sitting, Cuff Size: Normal)   Pulse 85   Ht 6' (1.829 m)   Wt 238 lb 6.4 oz (108.1 kg)   SpO2 95%   BMI 32.33 kg/m  , BMI Body mass index is 32.33 kg/m. Constitutional:  oriented to person, place, and time. No distress.  HENT:  Head: Grossly normal Eyes:  no discharge. No scleral icterus.  Neck: No JVD, no carotid bruits  Cardiovascular: Regular rate and rhythm, no murmurs appreciated Pulmonary/Chest: Clear to auscultation bilaterally, no wheezes or rails Abdominal: Soft.  no distension.  no tenderness.  Musculoskeletal: Normal range of motion Neurological:  normal muscle tone. Coordination normal. No atrophy Skin: Skin warm and dry Psychiatric: normal affect, pleasant   Recent Labs: No results found for requested labs within last 365 days.    Lipid Panel Lab Results  Component Value Date    CHOL 115 11/04/2020   HDL 40 11/04/2020   LDLCALC 54 11/04/2020   TRIG 126 11/04/2020      Wt Readings from Last 3 Encounters:  01/29/23 238 lb 6.4 oz (108.1 kg)  12/29/21 248 lb 12.8 oz (112.9 kg)  12/16/21 244 lb 2 oz (110.7 kg)      ASSESSMENT AND PLAN:  Coronary artery disease of native artery of native heart with stable angina pectoris (HCC) -  Tolerating Plavix and Xarelto 2.5 mg po BID Currently with no symptoms of angina. No further workup at this time. Continue current medication regimen.  Cardiomyopathy, ischemic Continue current medications, appears relatively euvolemic recommend he talk with endocrine whether he can add Jardiance or [place]  Mixed hyperlipidemia Continue Zetia and Lipitor Cholesterol at goal  TIA He was evaluated in the  hospital twice for TIA symptoms Continue Plavix and Xarelto 2.5 twice daily No further episodes  Type 2 diabetes mellitus with other circulatory complication, unspecified whether long term insulin use (HCC) A1c high, managed by endocrine Consider Jardiance/Farxiga Exercise program recommended  Bipolar disorder, in partial remission, most recent episode depressed (HCC) Stable managed by primary care  Chronic combined systolic and diastolic CHF (congestive heart failure) (HCC)  Ejection fraction 35-40% March 2017 Echocardiogram November 2022 : ejection fraction 35 to 40% Continue metoprolol succinate, spironolactone, continue Entresto,  Consider adding Jardiance/Farxiga if cleared by endocrine    Total encounter time more than 30 minutes  Greater than 50% was spent in counseling and coordination of care with the patient    Orders Placed This Encounter  Procedures   Lipid panel   EKG 12-Lead      Signed, Dossie Arbour, M.D., Ph.D. 01/29/2023  [facility] [place], [place] [phone]

## 2023-01-29 ENCOUNTER — Ambulatory Visit: Payer: Commercial Managed Care - PPO | Attending: Cardiovascular Disease | Admitting: Cardiovascular Disease

## 2023-01-29 ENCOUNTER — Other Ambulatory Visit: Payer: Self-pay

## 2023-01-29 ENCOUNTER — Encounter: Payer: Self-pay | Admitting: Cardiovascular Disease

## 2023-01-29 VITALS — BP 108/70 | HR 85 | Ht 72.0 in | Wt 238.4 lb

## 2023-01-29 DIAGNOSIS — Z8673 Personal history of transient ischemic attack (TIA), and cerebral infarction without residual deficits: Secondary | ICD-10-CM

## 2023-01-29 DIAGNOSIS — I5042 Chronic combined systolic (congestive) and diastolic (congestive) heart failure: Secondary | ICD-10-CM

## 2023-01-29 DIAGNOSIS — R0602 Shortness of breath: Secondary | ICD-10-CM

## 2023-01-29 DIAGNOSIS — I10 Essential (primary) hypertension: Secondary | ICD-10-CM | POA: Diagnosis not present

## 2023-01-29 DIAGNOSIS — E782 Mixed hyperlipidemia: Secondary | ICD-10-CM

## 2023-01-29 DIAGNOSIS — E1159 Type 2 diabetes mellitus with other circulatory complications: Secondary | ICD-10-CM

## 2023-01-29 DIAGNOSIS — I25118 Atherosclerotic heart disease of native coronary artery with other forms of angina pectoris: Secondary | ICD-10-CM | POA: Diagnosis not present

## 2023-01-29 DIAGNOSIS — R079 Chest pain, unspecified: Secondary | ICD-10-CM | POA: Diagnosis not present

## 2023-01-29 DIAGNOSIS — I209 Angina pectoris, unspecified: Secondary | ICD-10-CM

## 2023-01-29 MED ORDER — XARELTO 2.5 MG PO TABS
2.5000 mg | ORAL_TABLET | Freq: Two times a day (BID) | ORAL | 3 refills | Status: DC
  Filled 2023-01-29 – 2023-03-24 (×2): qty 180, 90d supply, fill #0
  Filled 2023-06-17: qty 180, 90d supply, fill #1
  Filled 2024-01-16: qty 180, 90d supply, fill #2

## 2023-01-29 MED ORDER — SPIRONOLACTONE 25 MG PO TABS
12.5000 mg | ORAL_TABLET | Freq: Every morning | ORAL | 3 refills | Status: DC
Start: 2023-01-29 — End: 2024-04-16
  Filled 2023-01-29 – 2023-03-24 (×2): qty 45, 90d supply, fill #0
  Filled 2023-06-17: qty 45, 90d supply, fill #1
  Filled 2024-01-16: qty 45, 90d supply, fill #2

## 2023-01-29 MED ORDER — EZETIMIBE 10 MG PO TABS
10.0000 mg | ORAL_TABLET | Freq: Every day | ORAL | 3 refills | Status: DC
  Filled 2023-01-29 – 2023-03-24 (×2): qty 90, 90d supply, fill #0
  Filled 2023-06-17: qty 90, 90d supply, fill #1
  Filled 2024-01-16: qty 90, 90d supply, fill #2

## 2023-01-29 MED ORDER — FUROSEMIDE 20 MG PO TABS
20.0000 mg | ORAL_TABLET | Freq: Every day | ORAL | 3 refills | Status: DC
  Filled 2023-01-29 – 2023-03-24 (×2): qty 90, 90d supply, fill #0
  Filled 2023-06-17: qty 90, 90d supply, fill #1
  Filled 2024-01-16: qty 90, 90d supply, fill #2

## 2023-01-29 MED ORDER — ENTRESTO 24-26 MG PO TABS
1.0000 | ORAL_TABLET | Freq: Two times a day (BID) | ORAL | 3 refills | Status: DC
  Filled 2023-01-29 – 2023-03-24 (×2): qty 180, 90d supply, fill #0
  Filled 2023-06-17: qty 180, 90d supply, fill #1
  Filled 2024-01-16: qty 180, 90d supply, fill #2

## 2023-01-29 MED ORDER — METOPROLOL SUCCINATE ER 100 MG PO TB24
100.0000 mg | ORAL_TABLET | Freq: Every evening | ORAL | 3 refills | Status: DC
  Filled 2023-01-29 – 2023-03-24 (×2): qty 90, 90d supply, fill #0
  Filled 2023-06-17: qty 90, 90d supply, fill #1
  Filled 2024-01-16: qty 90, 90d supply, fill #2

## 2023-01-29 NOTE — Patient Instructions (Addendum)
Medication Instructions:  No changes *If you need a refill on your cardiac medications before your next appointment, please call your pharmacy*   Lab Work: Your provider would like for you to have following labs drawn today fasting lipid .   If you have labs (blood work) drawn today and your tests are completely normal, you will receive your results only by: MyChart Message (if you have MyChart) OR A paper copy in the mail If you have any lab test that is abnormal or we need to change your treatment, we will call you to review the results.   Testing/Procedures: None ordered   Follow-Up: At [facility], you and your health needs are our priority.  As part of our continuing mission to provide you with exceptional heart care, we have created designated Provider Care Teams.  These Care Teams include your primary Cardiologist (physician) and Advanced Practice Providers (APPs -  Physician Assistants and Nurse Practitioners) who all work together to provide you with the care you need, when you need it.  We recommend signing up for the patient portal called "MyChart".  Sign up information is provided on this After Visit Summary.  MyChart is used to connect with patients for Virtual Visits (Telemedicine).  Patients are able to view lab/test results, encounter notes, upcoming appointments, etc.  Non-urgent messages can be sent to your provider as well.   To learn more about what you can do with MyChart, go to [email or web address].    Your next appointment:   6 month(s)  Provider:   You may see Dr. Mariah Milling or one of the following Advanced Practice Providers on your designated Care Team:   Nicolasa Ducking, NP Eula Listen, PA-C Cadence Fransico Michael, PA-C Charlsie Quest, NP

## 2023-02-09 ENCOUNTER — Other Ambulatory Visit: Payer: Self-pay

## 2023-02-16 ENCOUNTER — Other Ambulatory Visit: Payer: Self-pay

## 2023-03-13 IMAGING — CT CT RENAL STONE PROTOCOL
2 of 4 series · 16 of 46 positions shown, 18 images · non-contrast
Comparison: None

CLINICAL DATA: RIGHT flank pain suspected kidney stone, RIGHT upper
quadrant pain for 4 months increased in past 2-3 weeks. History
diabetes mellitus, coronary artery disease post MI, CHF,
hypertension

EXAM:
CT ABDOMEN AND PELVIS WITHOUT CONTRAST
TECHNIQUE: Multidetector CT imaging of the abdomen and pelvis was performed
following the standard protocol without IV contrast. Sagittal and
coronal MPR images reconstructed from axial data set. No oral
contrast administered.

[Series 3: stone study 5.0 br38 1 · axial · 0.78mm/px · z∈[-736,-216]mm · 13 of 114 slices shown, 15 images]
[im 5/114  soft-tissue]
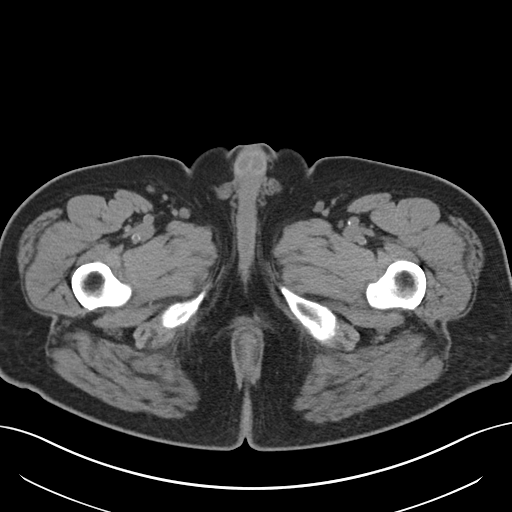
[im 5/114  bone]
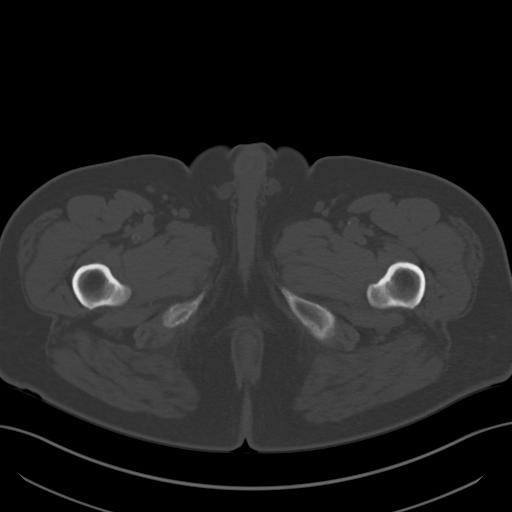
[im 15/114  soft-tissue]
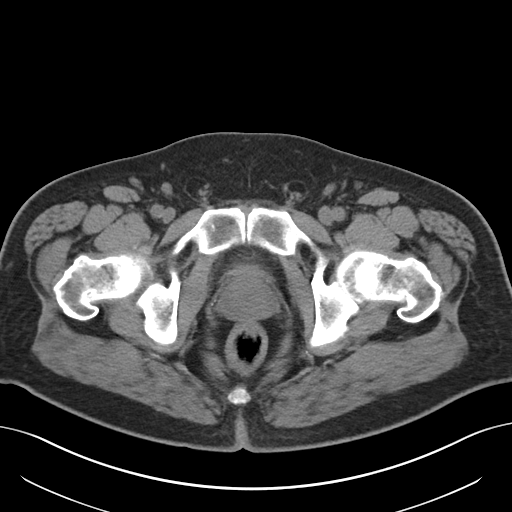
[im 25/114  soft-tissue]
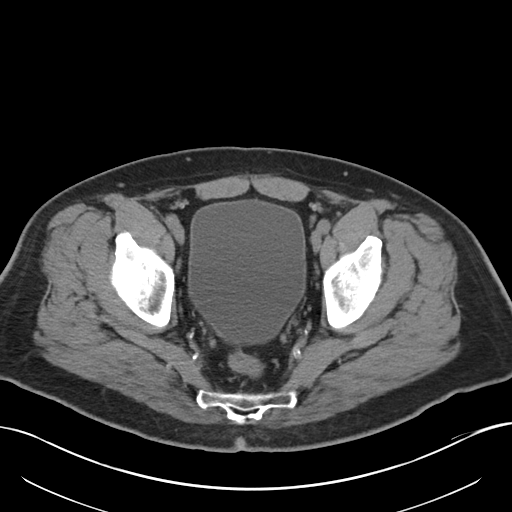
[im 30/114  soft-tissue]
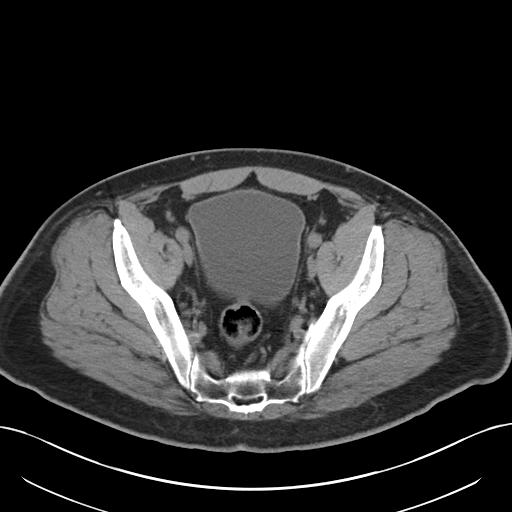
[im 40/114  soft-tissue]
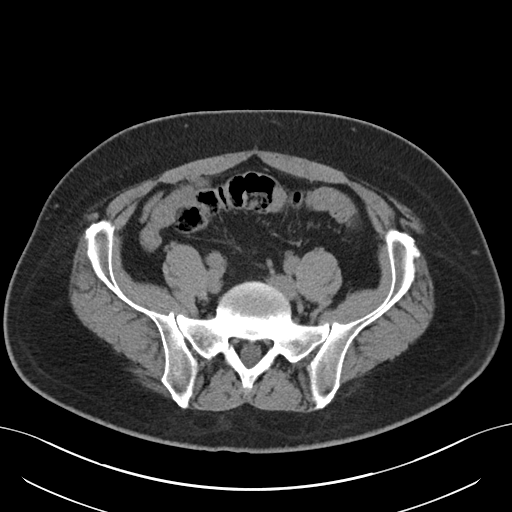
[im 50/114  soft-tissue]
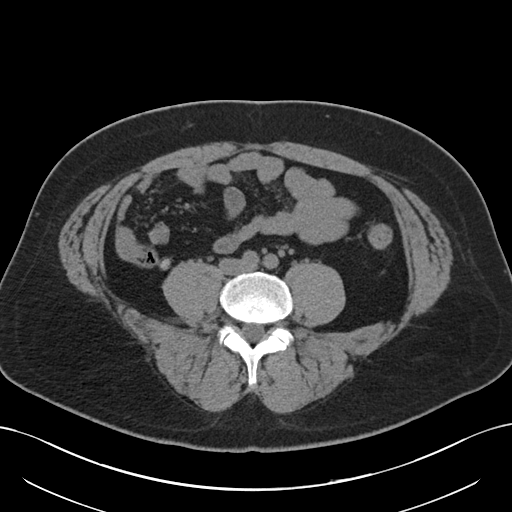
[im 59/114  soft-tissue]
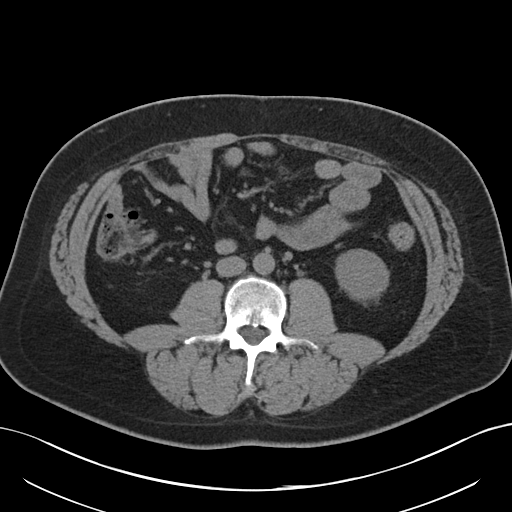
[im 64/114  soft-tissue]
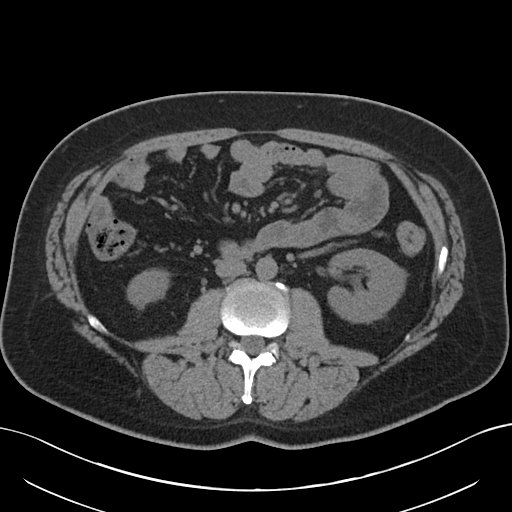
[im 74/114  soft-tissue]
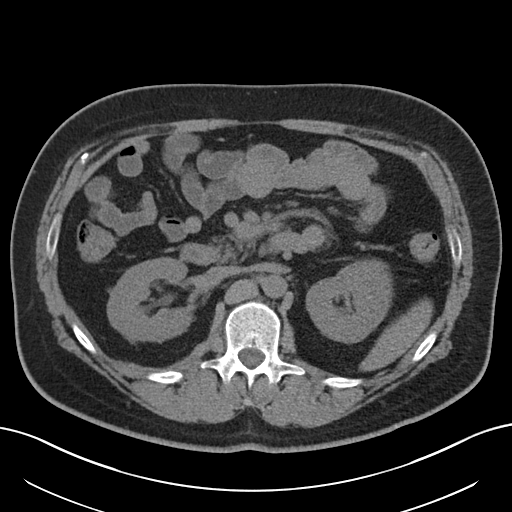
[im 74/114  bone]
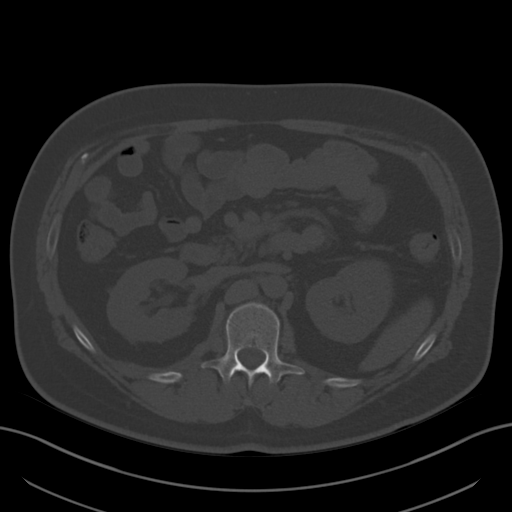
[im 84/114  soft-tissue]
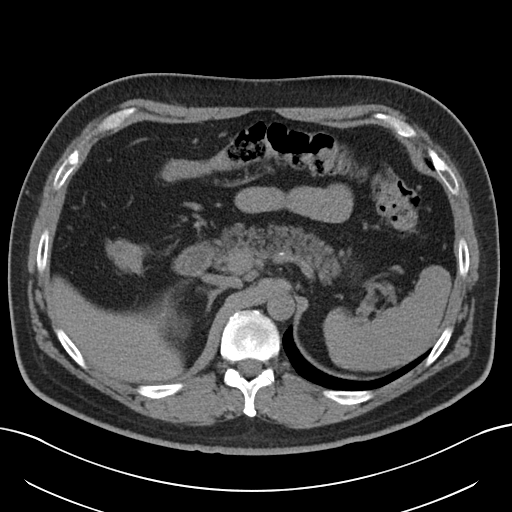
[im 89/114  soft-tissue]
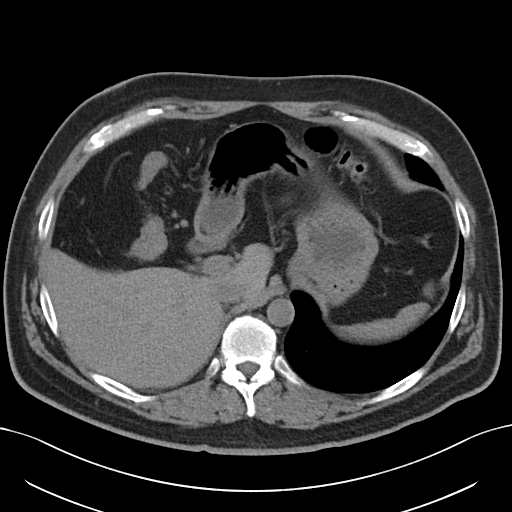
[im 99/114  soft-tissue]
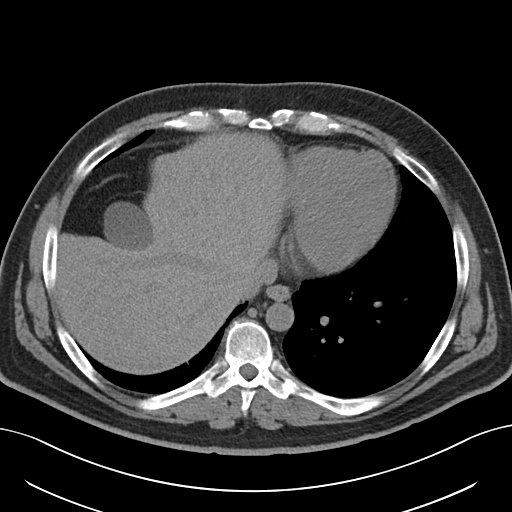
[im 109/114  soft-tissue]
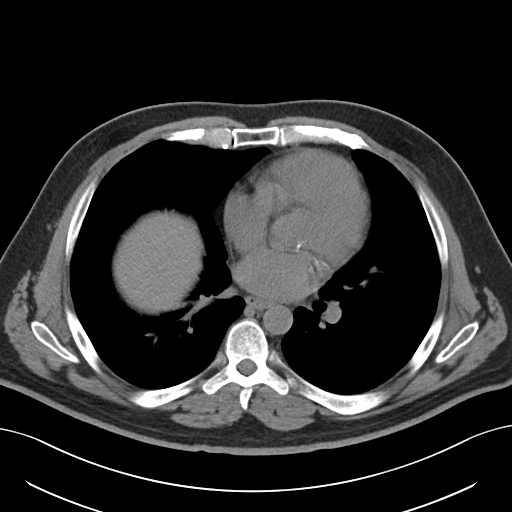

[Series 6: coronal soft tissue · coronal · 0.73mm/px · 3 of 99 slices shown]
[im 33/99  soft-tissue]
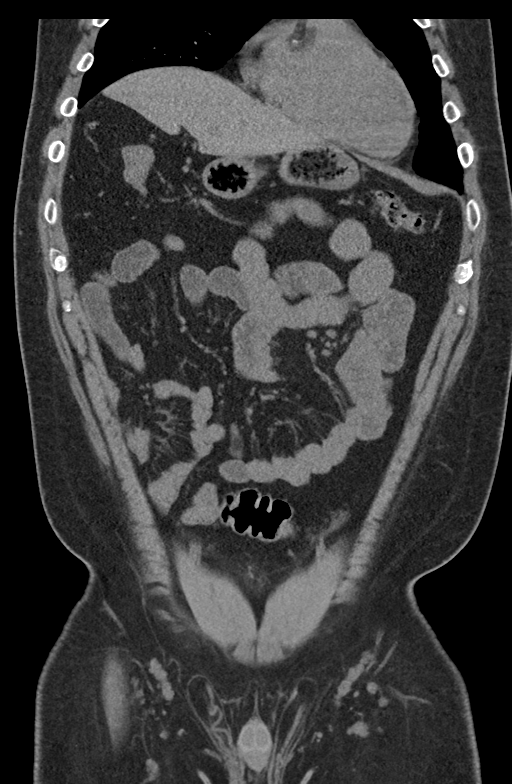
[im 44/99  soft-tissue]
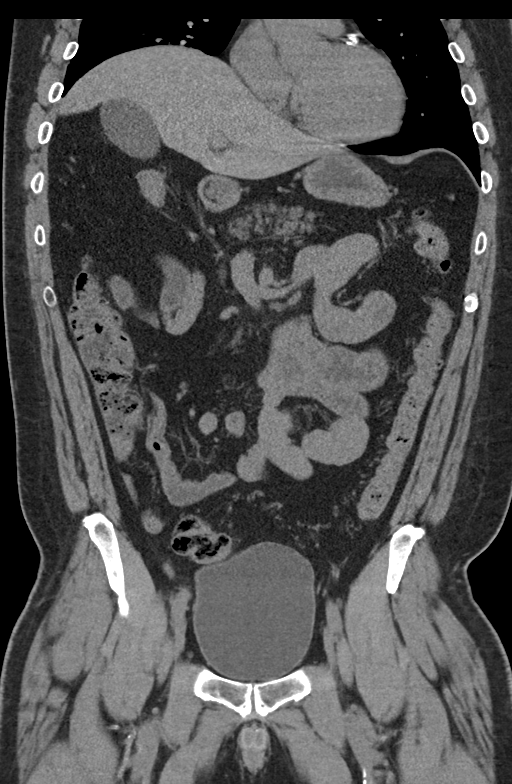
[im 55/99  soft-tissue]
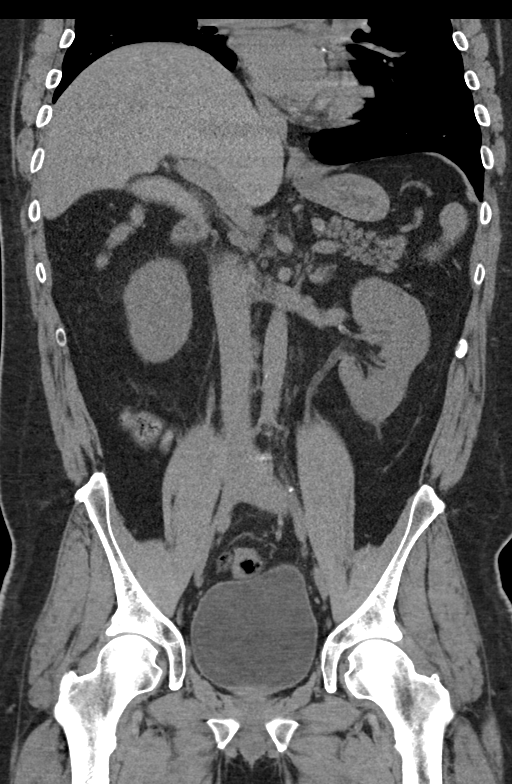

[16 of 46 positions shown; findings below may reference images not displayed]

FINDINGS: Lower chest: Lung bases clear

Hepatobiliary: Gallbladder and liver normal appearance

Pancreas: Normal appearance

Spleen: Normal appearance

Adrenals/Urinary Tract: Adrenal glands normal appearance. Kidneys,
ureters, and bladder normal appearance

Stomach/Bowel: Normal appendix.

Vascular/Lymphatic: Minimal atherosclerotic calcification aorta.
Aorta normal caliber. No adenopathy.

Reproductive: Mild prostatic enlargement measuring 4.8 x 3.8 x
cm (volume = 34 cm^3)

Other: No free air or free fluid. Tiny umbilical hernia containing
fat. No definite inflammatory process.

Musculoskeletal: No acute osseous findings.
IMPRESSION: Mild prostatic enlargement.

Tiny umbilical hernia containing fat.

No acute intra-abdominal or intrapelvic abnormalities.

Specifically, no evidence of urinary tract calcification or
dilatation.

Aortic Atherosclerosis (CYVUP-OS9.9).

## 2023-03-24 ENCOUNTER — Other Ambulatory Visit: Payer: Self-pay | Admitting: Family

## 2023-03-24 ENCOUNTER — Other Ambulatory Visit: Payer: Self-pay

## 2023-03-24 DIAGNOSIS — Z794 Long term (current) use of insulin: Secondary | ICD-10-CM

## 2023-03-24 DIAGNOSIS — F3175 Bipolar disorder, in partial remission, most recent episode depressed: Secondary | ICD-10-CM

## 2023-03-25 ENCOUNTER — Other Ambulatory Visit: Payer: Self-pay

## 2023-03-25 ENCOUNTER — Other Ambulatory Visit: Payer: Self-pay | Admitting: Family

## 2023-03-25 DIAGNOSIS — E1140 Type 2 diabetes mellitus with diabetic neuropathy, unspecified: Secondary | ICD-10-CM

## 2023-03-25 DIAGNOSIS — F3175 Bipolar disorder, in partial remission, most recent episode depressed: Secondary | ICD-10-CM

## 2023-03-25 NOTE — Telephone Encounter (Signed)
Called and left a voice message asking patient to give the office a call to set up an office appointment.

## 2023-03-26 ENCOUNTER — Other Ambulatory Visit: Payer: Self-pay

## 2023-03-26 ENCOUNTER — Other Ambulatory Visit: Payer: Self-pay | Admitting: Cardiovascular Disease

## 2023-03-26 MED FILL — Clopidogrel Bisulfate Tab 75 MG (Base Equiv): ORAL | 90 days supply | Qty: 90 | Fill #0 | Status: AC

## 2023-03-26 MED FILL — Atorvastatin Calcium Tab 80 MG (Base Equivalent): ORAL | 90 days supply | Qty: 90 | Fill #0 | Status: AC

## 2023-03-26 NOTE — Telephone Encounter (Signed)
last visit:  01/29/23 with plan to f/u in 6 months.  Next visit: none/active recall

## 2023-03-29 ENCOUNTER — Other Ambulatory Visit: Payer: Self-pay

## 2023-03-29 MED FILL — Gabapentin Tab 600 MG: ORAL | 30 days supply | Qty: 90 | Fill #0 | Status: AC

## 2023-03-29 MED FILL — Sertraline HCl Tab 50 MG: ORAL | 90 days supply | Qty: 90 | Fill #0 | Status: CN

## 2023-03-30 ENCOUNTER — Other Ambulatory Visit: Payer: Self-pay

## 2023-03-30 MED FILL — Atorvastatin Calcium Tab 80 MG (Base Equivalent): ORAL | 90 days supply | Qty: 90 | Fill #0 | Status: CN

## 2023-03-30 MED FILL — Clopidogrel Bisulfate Tab 75 MG (Base Equiv): ORAL | 90 days supply | Qty: 90 | Fill #0 | Status: CN

## 2023-04-12 ENCOUNTER — Other Ambulatory Visit: Payer: Self-pay

## 2023-04-15 ENCOUNTER — Encounter: Payer: Self-pay | Admitting: Family

## 2023-04-15 ENCOUNTER — Telehealth: Payer: Self-pay | Admitting: Family

## 2023-04-15 ENCOUNTER — Ambulatory Visit: Payer: Commercial Managed Care - PPO | Admitting: Family

## 2023-04-15 VITALS — BP 118/78 | HR 84 | Temp 97.7°F | Ht 72.0 in | Wt 219.8 lb

## 2023-04-15 DIAGNOSIS — Z1211 Encounter for screening for malignant neoplasm of colon: Secondary | ICD-10-CM

## 2023-04-15 DIAGNOSIS — Z125 Encounter for screening for malignant neoplasm of prostate: Secondary | ICD-10-CM

## 2023-04-15 DIAGNOSIS — Z1159 Encounter for screening for other viral diseases: Secondary | ICD-10-CM

## 2023-04-15 DIAGNOSIS — E1159 Type 2 diabetes mellitus with other circulatory complications: Secondary | ICD-10-CM | POA: Diagnosis not present

## 2023-04-15 DIAGNOSIS — F3175 Bipolar disorder, in partial remission, most recent episode depressed: Secondary | ICD-10-CM | POA: Diagnosis not present

## 2023-04-15 DIAGNOSIS — E782 Mixed hyperlipidemia: Secondary | ICD-10-CM

## 2023-04-15 DIAGNOSIS — Z794 Long term (current) use of insulin: Secondary | ICD-10-CM

## 2023-04-15 DIAGNOSIS — I10 Essential (primary) hypertension: Secondary | ICD-10-CM | POA: Diagnosis not present

## 2023-04-15 LAB — COMPREHENSIVE METABOLIC PANEL
ALT: 18 U/L (ref 0–53)
AST: 13 U/L (ref 0–37)
Albumin: 4.1 g/dL (ref 3.5–5.2)
Alkaline Phosphatase: 135 U/L — ABNORMAL HIGH (ref 39–117)
BUN: 27 mg/dL — ABNORMAL HIGH (ref 6–23)
CO2: 26 meq/L (ref 19–32)
Calcium: 9.3 mg/dL (ref 8.4–10.5)
Chloride: 100 meq/L (ref 96–112)
Creatinine, Ser: 1 mg/dL (ref 0.40–1.50)
GFR: 88.23 mL/min (ref >60.00)
Glucose, Bld: 375 mg/dL — ABNORMAL HIGH (ref 70–99)
Potassium: 5 meq/L (ref 3.5–5.1)
Sodium: 134 meq/L — ABNORMAL LOW (ref 135–145)
Total Bilirubin: 0.5 mg/dL (ref 0.2–1.2)
Total Protein: 6.8 g/dL (ref 6.0–8.3)

## 2023-04-15 LAB — CBC WITH DIFFERENTIAL/PLATELET
Basophils Absolute: 0.1 10*3/uL (ref 0.0–0.1)
Basophils Relative: 0.9 % (ref 0.0–3.0)
Eosinophils Absolute: 0.1 10*3/uL (ref 0.0–0.7)
Eosinophils Relative: 2.3 % (ref 0.0–5.0)
HCT: 40.4 % (ref 39.0–52.0)
Hemoglobin: 13.8 g/dL (ref 13.0–17.0)
Lymphocytes Relative: 22.3 % (ref 12.0–46.0)
Lymphs Abs: 1.3 10*3/uL (ref 0.7–4.0)
MCHC: 34.3 g/dL (ref 30.0–36.0)
MCV: 89.6 fL (ref 78.0–100.0)
Monocytes Absolute: 0.6 10*3/uL (ref 0.1–1.0)
Monocytes Relative: 10 % (ref 3.0–12.0)
Neutro Abs: 3.9 10*3/uL (ref 1.4–7.7)
Neutrophils Relative %: 64.5 % (ref 43.0–77.0)
Platelets: 239 10*3/uL (ref 150.0–400.0)
RBC: 4.51 Mil/uL (ref 4.22–5.81)
RDW: 13.3 % (ref 11.5–15.5)
WBC: 6.1 10*3/uL (ref 4.0–10.5)

## 2023-04-15 LAB — VITAMIN D 25 HYDROXY (VIT D DEFICIENCY, FRACTURES): VITD: 9.15 ng/mL — ABNORMAL LOW (ref 30.00–100.00)

## 2023-04-15 LAB — POCT GLYCOSYLATED HEMOGLOBIN (HGB A1C): Hemoglobin A1C: 12.4 % — AB (ref 4.0–5.6)

## 2023-04-15 LAB — LIPID PANEL
Cholesterol: 102 mg/dL (ref 0–200)
HDL: 24.7 mg/dL — ABNORMAL LOW (ref >39.00)
LDL Cholesterol: 23 mg/dL (ref 0–99)
NonHDL: 77.3
Total CHOL/HDL Ratio: 4
Triglycerides: 270 mg/dL — ABNORMAL HIGH (ref 0.0–149.0)
VLDL: 54 mg/dL — ABNORMAL HIGH (ref 0.0–40.0)

## 2023-04-15 LAB — PSA: PSA: 0.85 ng/mL (ref 0.10–4.00)

## 2023-04-15 LAB — MICROALBUMIN / CREATININE URINE RATIO
Creatinine,U: 71.5 mg/dL
Microalb Creat Ratio: 130.8 mg/g — ABNORMAL HIGH (ref 0.0–30.0)
Microalb, Ur: 93.5 mg/dL — ABNORMAL HIGH (ref 0.0–1.9)

## 2023-04-15 LAB — TSH: TSH: 1.62 u[IU]/mL (ref 0.35–5.50)

## 2023-04-15 NOTE — Progress Notes (Signed)
Assessment & Plan:  Type 2 diabetes mellitus with other circulatory complication, unspecified whether long term insulin use (HCC) Assessment & Plan: Lab Results  Component Value Date   HGBA1C 12.4 (A) 04/15/2023   Severely uncontrolled.  Lost to follow-up with endocrine.  No longer wearing insulin pump.  Patient declines returning to Dr Tedd Sias.  He would like to see endocrine in [place] and I have placed this referral.  I advised to starting insulin therapy is critical at this time to avoid DKA, further diabetic complications particularly in the setting of stroke and heart attack.  He prefers to resume his insulin pump on his own.  He understands that I do not change the settings of insulin pump and he would need to reach out Dr Tedd Sias to do so or if he had questions. Meanwhile I have gone ahead and placed a referral to [facility] endocrinology.  Consider the addition of Jardiance once we have better glycemic control.  close follow-up.   Orders: -     Comprehensive metabolic panel -     Lipid panel -     VITAMIN D 25 Hydroxy (Vit-D Deficiency, Fractures) -     Ambulatory referral to Endocrinology -     US ARTERIAL ABI (SCREENING LOWER EXTREMITY); Future -     Microalbumin / creatinine urine ratio; Future -     POCT glycosylated hemoglobin (Hb A1C)  Mixed hyperlipidemia  Screening for prostate cancer -     PSA  Hypertension, unspecified type Assessment & Plan: Chronic, stable.  Continue metoprolol succinate 100mg , spironolactone 12.5mg .  Compliant with Entresto as managed by Dr. Mariah Milling.  Orders: -     TSH -     CBC with Differential/Platelet  Screen for colon cancer -     Ambulatory referral to Gastroenterology  Encounter for hepatitis C screening test for low risk patient -     Hepatitis C antibody  Bipolar disorder, in partial remission, most recent episode depressed (HCC) Assessment & Plan: Chronic, stable.  Patient not on a mood stabilizer and declines resuming  indefinitely.  He is felt well on Zoloft 50 mg without symptoms of mania at this time.  Continue Zoloft 50 mg.      Return precautions given.   Risks, benefits, and alternatives of the medications and treatment plan prescribed today were discussed, and patient expressed understanding.   Education regarding symptom management and diagnosis given to patient on AVS either electronically or printed.  Return in about 3 months (around 07/16/2023).  Jose Plowman, FNP  Subjective:    Patient ID: Jose Merritt, male    DOB: 1975-09-20, 47 y.o.   MRN: [id number]  CC: Jose Merritt is a 47 y.o. male who presents today for follow up.   HPI: Feels well today.  No new complaints.  Feels well today.  No new complaints.   He has lost 20lbs in past month by following keto.       He isnt wearing Dexcom Insulin pump and is off all of insulin.  Endorses blurry vision which is improved with keto diet.  Denies polyuria, sob, CP.   He remains compliant with zoloft 50 mg and feels medication adequate for him.  He has a history of bipolar.  He is not on a mood stabilizer.  Denies symptoms of mania.  Denies depression, suicidal ideation  07/30/22 A1c 8.1 Last seen endocrinology 08/06/2022 at that time, he was on the insulin pump, avg daily dose of insulin delivered was 53.2  units, given as 73% basal and 27% bolus. He boluses on avg 1 time daily for carbs and on avg enters 51 g carbs per day. Cartridge and site change is q5-6 days.   Per endocrine note-  Metformin caused GI upset. Kombiglyze - denied by insurer in 2019 Farxiga - denied by insurer in 2019    Follow up Dr Mariah Milling 01/29/23 for CAD, CHF.  Continue on Plavix and Xarelto 2.5 mg twice daily. Continued on metoprolol succinate, spironolactone, continue Entresto.   Consider Farxiga  Allergies: Metrizamide, Other, Statins, Sulfa antibiotics, Sulfamethoxazole-trimethoprim, Sulfasalazine, Cephalosporins, Contrast media [iodinated  contrast media], and Shrimp (diagnostic) Current Outpatient Medications on File Prior to Visit  Medication Sig Dispense Refill   atorvastatin (LIPITOR) 80 MG tablet Take 1 tablet (80 mg total) by mouth daily. 90 tablet 3   clopidogrel (PLAVIX) 75 MG tablet Take 1 tablet (75 mg total) by mouth daily. 90 tablet 3   ezetimibe (ZETIA) 10 MG tablet Take 1 tablet (10 mg total) by mouth once daily 90 tablet 3   furosemide (LASIX) 20 MG tablet Take 1 tablet (20 mg total) by mouth daily. 90 tablet 3   gabapentin (NEURONTIN) 600 MG tablet Take 3 tablets (1,800 mg total) by mouth at bedtime. 90 tablet 0   glucose blood (FREESTYLE LITE) test strip Used in conjunction with insulin pump to check blood sugars 4 x daily. (Patient not taking: Reported on 04/15/2023) 200 each 12   lidocaine (LIDODERM) 5 % Place 1 patch onto the skin daily. Remove & Discard patch within 12 hours. 30 patch 0   metoprolol succinate (TOPROL-XL) 100 MG 24 hr tablet Take 1 tablet (100 mg total) by mouth nightly. 90 tablet 3   nitroGLYCERIN (NITROSTAT) 0.3 MG SL tablet Place 1 tablet (0.3 mg total) under the tongue every 5 (five) minutes as needed. 25 tablet 3   rivaroxaban (XARELTO) 2.5 MG TABS tablet Take 1 tablet (2.5 mg total) by mouth 2 (two) times daily. 180 tablet 3   sacubitril-valsartan (ENTRESTO) 24-26 MG Take 1 tablet by mouth 2 (two) times daily. 180 tablet 3   scopolamine (TRANSDERM-SCOP) 1 MG/3DAYS Place 1 patch (1.5 mg total) onto the skin every 3 (three) days. 10 patch 0   sertraline (ZOLOFT) 50 MG tablet Take 1 tablet (50 mg total) by mouth at bedtime. 90 tablet 3   spironolactone (ALDACTONE) 25 MG tablet Take 0.5 tablets (12.5 mg total) by mouth every morning. 45 tablet 3   Blood Glucose Monitoring Suppl (FREESTYLE FREEDOM LITE) w/Device KIT Used in conjunction with insulin pump to check blood sugars 4 x daily. (Patient not taking: Reported on 04/15/2023) 1 kit 0   insulin lispro (HUMALOG) 100 UNIT/ML injection Take up to  150 units daily via insulin pump (Patient not taking: Reported on 04/15/2023) 140 mL 3   insulin lispro (HUMALOG) 100 UNIT/ML injection Take up to 150 units daily via insulin pump (Patient not taking: Reported on 04/15/2023) 140 mL 3   Insulin Pen Needle (UNIFINE PENTIPS) 31G X 5 MM MISC Use as directed with Humalog 3 times a day (Patient not taking: Reported on 04/15/2023) 100 each 0   Lancets (FREESTYLE) lancets Used in conjunction with insulin pump to check blood sugars 4 x daily. (Patient not taking: Reported on 04/15/2023) 200 each 12   No current facility-administered medications on file prior to visit.    Review of Systems  Constitutional:  Negative for chills and fever.  Eyes:  Positive for visual disturbance.  Respiratory:  Negative for cough.   Cardiovascular:  Negative for chest pain and palpitations.  Gastrointestinal:  Negative for nausea and vomiting.  Endocrine: Negative for polyuria.  Psychiatric/Behavioral:  Negative for dysphoric mood, sleep disturbance and suicidal ideas. The patient is not nervous/anxious.       Objective:    BP 118/78   Pulse 84   Temp 97.7 F (36.5 C) (Oral)   Ht 6' (1.829 m)   Wt 219 lb 12.8 oz (99.7 kg)   SpO2 98%   BMI 29.81 kg/m  BP Readings from Last 3 Encounters:  04/15/23 118/78  01/29/23 108/70  12/29/21 136/86   Wt Readings from Last 3 Encounters:  04/15/23 219 lb 12.8 oz (99.7 kg)  01/29/23 238 lb 6.4 oz (108.1 kg)  12/29/21 248 lb 12.8 oz (112.9 kg)    Physical Exam Vitals reviewed.  Constitutional:      Appearance: He is well-developed.  Cardiovascular:     Rate and Rhythm: Regular rhythm.     Heart sounds: Normal heart sounds.  Pulmonary:     Effort: Pulmonary effort is normal. No respiratory distress.     Breath sounds: Normal breath sounds. No wheezing, rhonchi or rales.  Skin:    General: Skin is warm and dry.  Neurological:     Mental Status: He is alert.  Psychiatric:        Speech: Speech normal.         Behavior: Behavior normal.

## 2023-04-15 NOTE — Telephone Encounter (Signed)
Call [facility] appt for DM Pt told his insurance doesn't cover; he has [facility] [employer]  Please sch an appt if insurance covers  If not , let me know

## 2023-04-16 ENCOUNTER — Other Ambulatory Visit: Payer: Self-pay

## 2023-04-16 ENCOUNTER — Other Ambulatory Visit: Payer: Self-pay | Admitting: Family

## 2023-04-16 ENCOUNTER — Telehealth: Payer: Self-pay

## 2023-04-16 DIAGNOSIS — Z8639 Personal history of other endocrine, nutritional and metabolic disease: Secondary | ICD-10-CM

## 2023-04-16 DIAGNOSIS — R899 Unspecified abnormal finding in specimens from other organs, systems and tissues: Secondary | ICD-10-CM

## 2023-04-16 LAB — HEPATITIS C ANTIBODY: Hepatitis C Ab: NONREACTIVE

## 2023-04-16 MED ORDER — CHOLECALCIFEROL 1.25 MG (50000 UT) PO CAPS
ORAL_CAPSULE | ORAL | 0 refills | Status: DC
  Filled 2023-04-16: qty 8, 56d supply, fill #0

## 2023-04-16 NOTE — Telephone Encounter (Signed)
LVM to call back to go over results and  schedule fasting labs in the next couple weeks; I have ordered the labs

## 2023-04-16 NOTE — Assessment & Plan Note (Addendum)
Chronic, stable.  Continue metoprolol succinate 100mg , spironolactone 12.5mg .  Compliant with Entresto as managed by Dr. Mariah Milling.

## 2023-04-16 NOTE — Assessment & Plan Note (Addendum)
Lab Results  Component Value Date   HGBA1C 12.4 (A) 04/15/2023   Severely uncontrolled.  Lost to follow-up with endocrine.  No longer wearing insulin pump.  Patient declines returning to Dr Tedd Sias.  He would like to see endocrine in [place] and I have placed this referral.  I advised to starting insulin therapy is critical at this time to avoid DKA, further diabetic complications particularly in the setting of stroke and heart attack.  He prefers to resume his insulin pump on his own.  He understands that I do not change the settings of insulin pump and he would need to reach out Dr Tedd Sias to do so or if he had questions. Meanwhile I have gone ahead and placed a referral to [facility] endocrinology.  Consider the addition of Jardiance once we have better glycemic control.  close follow-up.

## 2023-04-16 NOTE — Telephone Encounter (Signed)
Spoke to pt he is going to find out who is in his network and he will reach out to [place] and we can send referral

## 2023-04-16 NOTE — Assessment & Plan Note (Signed)
Chronic, stable.  Patient not on a mood stabilizer and declines resuming indefinitely.  He is felt well on Zoloft 50 mg without symptoms of mania at this time.  Continue Zoloft 50 mg.

## 2023-04-16 NOTE — Patient Instructions (Signed)
Please resume insulin pump and therapy as I am very concerned with your A1c today.  Please reach out Dr Tedd Sias with any questions in regards to pump therapy.  I have placed a referral for [facility] endocrinology in [place].  I have also placed a referral for colon cancer screening  Let us know if you dont hear back within a week in regards to an appointment being scheduled.   So that you are aware, if you are [facility] MyChart user , please pay attention to your MyChart messages as you may receive a MyChart message with a phone number to call and schedule this test/appointment own your own from our referral coordinator. This is a new process so I do not want you to miss this message.  If you are not a MyChart user, you will receive a phone call.

## 2023-04-21 ENCOUNTER — Ambulatory Visit
Admission: RE | Admit: 2023-04-21 | Discharge: 2023-04-21 | Disposition: A | Discharge status: Home / Self Care | Payer: Commercial Managed Care - PPO | Source: Ambulatory Visit | Attending: Family | Admitting: Family

## 2023-04-21 ENCOUNTER — Other Ambulatory Visit: Payer: Self-pay

## 2023-04-21 ENCOUNTER — Other Ambulatory Visit: Payer: Self-pay | Admitting: Family

## 2023-04-21 DIAGNOSIS — E1140 Type 2 diabetes mellitus with diabetic neuropathy, unspecified: Secondary | ICD-10-CM

## 2023-04-21 DIAGNOSIS — E1159 Type 2 diabetes mellitus with other circulatory complications: Secondary | ICD-10-CM | POA: Insufficient documentation

## 2023-04-22 ENCOUNTER — Other Ambulatory Visit: Payer: Self-pay

## 2023-04-22 MED ORDER — GABAPENTIN 600 MG PO TABS
1800.0000 mg | ORAL_TABLET | Freq: Every day | ORAL | 3 refills | Status: DC
  Filled 2023-04-22 – 2023-06-17 (×2): qty 270, 90d supply, fill #0
  Filled 2024-01-16: qty 270, 90d supply, fill #1
  Filled 2024-04-16: qty 270, 90d supply, fill #2

## 2023-04-23 ENCOUNTER — Encounter: Payer: Self-pay | Admitting: Family

## 2023-04-23 NOTE — Telephone Encounter (Signed)
Looks like it has resulted today

## 2023-04-27 ENCOUNTER — Ambulatory Visit: Payer: Commercial Managed Care - PPO | Admitting: Family

## 2023-04-28 ENCOUNTER — Other Ambulatory Visit (INDEPENDENT_AMBULATORY_CARE_PROVIDER_SITE_OTHER): Payer: Commercial Managed Care - PPO

## 2023-04-28 DIAGNOSIS — R899 Unspecified abnormal finding in specimens from other organs, systems and tissues: Secondary | ICD-10-CM | POA: Diagnosis not present

## 2023-04-28 LAB — HEPATIC FUNCTION PANEL
ALT: 15 U/L (ref 0–53)
AST: 10 U/L (ref 0–37)
Albumin: 4 g/dL (ref 3.5–5.2)
Alkaline Phosphatase: 100 U/L (ref 39–117)
Bilirubin, Direct: 0.1 mg/dL (ref 0.0–0.3)
Total Bilirubin: 0.6 mg/dL (ref 0.2–1.2)
Total Protein: 6.7 g/dL (ref 6.0–8.3)

## 2023-04-28 LAB — GAMMA GT: GGT: 11 U/L (ref 7–51)

## 2023-05-04 LAB — ALKALINE PHOSPHATASE, ISOENZYMES
Alkaline Phosphatase: 115 [IU]/L (ref 44–121)
BONE FRACTION: 13 % (ref 12–68)
INTESTINAL FRAC.: 23 % — ABNORMAL HIGH (ref 0–18)
LIVER FRACTION: 64 % (ref 13–88)

## 2023-05-05 ENCOUNTER — Other Ambulatory Visit: Payer: Self-pay | Admitting: Family

## 2023-05-05 DIAGNOSIS — E1159 Type 2 diabetes mellitus with other circulatory complications: Secondary | ICD-10-CM

## 2023-05-06 ENCOUNTER — Encounter: Payer: Self-pay | Admitting: *Deleted

## 2023-06-17 ENCOUNTER — Ambulatory Visit (INDEPENDENT_AMBULATORY_CARE_PROVIDER_SITE_OTHER): Payer: Commercial Managed Care - PPO | Admitting: Nurse Practitioner

## 2023-06-17 ENCOUNTER — Other Ambulatory Visit: Payer: Self-pay

## 2023-06-17 ENCOUNTER — Encounter (INDEPENDENT_AMBULATORY_CARE_PROVIDER_SITE_OTHER): Payer: Self-pay | Admitting: Nurse Practitioner

## 2023-06-17 ENCOUNTER — Other Ambulatory Visit (INDEPENDENT_AMBULATORY_CARE_PROVIDER_SITE_OTHER): Payer: Self-pay | Admitting: Nurse Practitioner

## 2023-06-17 ENCOUNTER — Other Ambulatory Visit: Payer: Self-pay | Admitting: Cardiovascular Disease

## 2023-06-17 VITALS — BP 119/75 | HR 76 | Resp 18 | Ht 72.0 in | Wt 213.4 lb

## 2023-06-17 DIAGNOSIS — M79605 Pain in left leg: Secondary | ICD-10-CM | POA: Diagnosis not present

## 2023-06-17 DIAGNOSIS — E1159 Type 2 diabetes mellitus with other circulatory complications: Secondary | ICD-10-CM | POA: Diagnosis not present

## 2023-06-17 DIAGNOSIS — I252 Old myocardial infarction: Secondary | ICD-10-CM

## 2023-06-17 DIAGNOSIS — I739 Peripheral vascular disease, unspecified: Secondary | ICD-10-CM

## 2023-06-17 DIAGNOSIS — I10 Essential (primary) hypertension: Secondary | ICD-10-CM | POA: Diagnosis not present

## 2023-06-17 DIAGNOSIS — M79604 Pain in right leg: Secondary | ICD-10-CM

## 2023-06-17 MED ORDER — NITROGLYCERIN 0.3 MG SL SUBL
0.3000 mg | SUBLINGUAL_TABLET | SUBLINGUAL | 3 refills | Status: AC | PRN
  Filled 2023-06-17: qty 100, 10d supply, fill #0

## 2023-06-17 MED FILL — Sertraline HCl Tab 50 MG: ORAL | 90 days supply | Qty: 90 | Fill #0 | Status: AC

## 2023-06-17 MED FILL — Atorvastatin Calcium Tab 80 MG (Base Equivalent): ORAL | 90 days supply | Qty: 90 | Fill #0 | Status: AC

## 2023-06-17 MED FILL — Clopidogrel Bisulfate Tab 75 MG (Base Equiv): ORAL | 90 days supply | Qty: 90 | Fill #0 | Status: AC

## 2023-06-18 ENCOUNTER — Other Ambulatory Visit: Payer: Self-pay

## 2023-06-20 NOTE — Progress Notes (Incomplete)
Subjective:    Patient ID: Jose Merritt, male    DOB: 12/05/1975, 48 y.o.   MRN: [id number] Chief Complaint  Patient presents with  . New Patient (Initial Visit)    Specialty Services Required Type 2 diabetes mellitus with other circulatory complication, unspecified whether long term insulin use    HPI  Review of Systems  Cardiovascular:  Positive for leg swelling.  Neurological:  Positive for numbness.  All other systems reviewed and are negative.      Objective:   Physical Exam Vitals reviewed.  HENT:     Head: Normocephalic.  Cardiovascular:     Rate and Rhythm: Normal rate.  Pulmonary:     Effort: Pulmonary effort is normal.  Musculoskeletal:     Right lower leg: Edema present.     Left lower leg: Edema present.  Skin:    General: Skin is warm and dry.  Neurological:     Mental Status: He is alert and oriented to person, place, and time.  Psychiatric:        Mood and Affect: Mood normal.        Behavior: Behavior normal.        Thought Content: Thought content normal.        Judgment: Judgment normal.     BP 119/75   Pulse 76   Resp 18   Ht 6' (1.829 m)   Wt 213 lb 6.4 oz (96.8 kg)   BMI 28.94 kg/m   Past Medical History:  Diagnosis Date  . Asthma   . Bipolar disorder (HCC)   . CHF (congestive heart failure) (HCC)   . Chicken pox   . Clotting disorder (HCC)    lung  . Depression   . Diabetes mellitus without complication (HCC) 2003   Type 2  . Heart murmur    as child  . Hyperlipidemia   . Hypertension   . Myocardial infarction (HCC) 06/2015  . Pulmonary embolism (HCC) 2018  . Retinopathy of both eyes 01/2016  . SOB (shortness of breath) 08/17/2016  . Stroke [facility])     Social History   Socioeconomic History  . Marital status: Married    Spouse name: Keziah Mckinnies  . Number of children: 1  . Years of education: Not on file  . Highest education level: Associate degree: occupational, Scientist, product/process development, or vocational program   Occupational History  . 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 Smoking status: Never  . Smokeless tobacco: Never  Vaping Use  . Vaping status: Never Used  Substance and Sexual Activity  . Alcohol use: No    Comment: rare- 2x/year  . Drug use: No  . Sexual activity: Yes    Comment: impotence post heart attack  Other Topics Concern  . Not on file  Social History Narrative   Wife is CNM at [employer]      [facility] Security- 4 yo daughter      Linton Ham from [facility]      Social Drivers of Health   Financial Resource Strain: Low Risk  (05/01/2017)   Overall Financial Resource Strain (CARDIA)   . Difficulty of Paying Living Expenses: Not hard at all  Food Insecurity: No Food Insecurity (05/01/2017)   Hunger Vital Sign   . Worried About Programme researcher, broadcasting/film/video in the Last Year: Never true   . Ran Out of Food in the Last Year: Never true  Transportation Needs: No Transportation Needs (05/01/2017)   PRAPARE - Transportation   . Lack of Transportation (Medical): No   . Lack of Transportation (Non-Medical): No  Physical Activity: Inactive (05/01/2017)   Exercise Vital Sign   . Days of Exercise per Week: 0 days   . Minutes of Exercise per Session: 0 min  Stress: No Stress Concern Present (05/01/2017)   [employer] of Occupational Health - Occupational Stress Questionnaire   . Feeling of Stress : Not at all  Social Connections: Moderately Integrated (05/01/2017)   Social Connection and Isolation Panel [NHANES]   . Frequency of Communication with Friends and Family: More than three times a week   . Frequency of Social Gatherings with Friends and Family: Once a week   . Attends Religious Services: More than 4 times per year   . Active Member of Clubs or Organizations: No   . Attends Banker Meetings: Never   . Marital Status: Married  Catering manager Violence: Not At Risk (09/20/2017)   Humiliation, Afraid, Rape, and  Kick questionnaire   . Fear of Current or Ex-Partner: No   . Emotionally Abused: No   . Physically Abused: No   . Sexually Abused: No    Past Surgical History:  Procedure Laterality Date  . CORONARY ANGIOPLASTY WITH STENT PLACEMENT Left 06/2015   LADx1 stent  . WISDOM TOOTH EXTRACTION      Family History  Problem Relation Age of Onset  . Hypertension Mother   . Arthritis Mother   . Heart disease Father        3 heart attacks  . Diabetes Father   . Hypertension Father   . Hyperlipidemia Father   . Alcohol abuse Father   . Hypertension Brother   . Hyperlipidemia Brother   . Heart disease Maternal Uncle   . Heart disease Paternal Aunt   . Hypertension Maternal Grandmother   . Kidney disease Maternal Grandmother   . Arthritis Maternal Grandmother   . Heart disease Maternal Grandfather   . Hyperlipidemia Maternal Grandfather   . Diabetes Maternal Grandfather   . Kidney disease Maternal Grandfather   . Heart disease Paternal Grandmother   . Kidney disease Paternal Grandmother   . Kidney disease Paternal Grandfather   . Colon cancer Neg Hx     Allergies  Allergen Reactions  . Metrizamide Anaphylaxis  . Other Anaphylaxis    Shrimp Shrimp Shrimp  . Statins Anaphylaxis    Uncoded Allergy. Allergen: CT contrast dye. Tolerated contrast for heart catheterization 07/2015  . Sulfa Antibiotics Anaphylaxis    Uncoded Allergy. Allergen: CT contrast dye. Tolerated contrast for heart catheterization 07/2015  . Sulfamethoxazole-Trimethoprim Anaphylaxis    Uncoded Allergy. Allergen: CT contrast dye. Tolerated contrast for heart catheterization 07/2015  . Sulfasalazine Anaphylaxis    Uncoded Allergy. Allergen: CT contrast dye. Tolerated contrast for heart catheterization 07/2015  . Cephalosporins   . Contrast Media [Iodinated Contrast Media]   . Shrimp (Diagnostic)        Latest Ref Rng & Units 04/15/2023   11:16 AM 12/14/2021    2:13 AM 06/30/2021    4:06 PM  CBC  WBC 4.0 -  10.5 K/uL 6.1  8.0  5.4   Hemoglobin 13.0 - 17.0 g/dL 16.1  09.6  04.5   Hematocrit 39.0 - 52.0 % 40.4  41.6  43.0   Platelets 150.0 - 400.0 K/uL 239.0  223  196       CMP     Component Value  Date/Time   NA 134 (L) 04/15/2023 1116   NA 133 (L) 05/21/2020 1145   K 5.0 04/15/2023 1116   CL 100 04/15/2023 1116   CO2 26 04/15/2023 1116   GLUCOSE 375 (H) 04/15/2023 1116   BUN 27 (H) 04/15/2023 1116   BUN 19 05/21/2020 1145   CREATININE 1.00 04/15/2023 1116   CREATININE 0.93 06/20/2021 1545   CALCIUM 9.3 04/15/2023 1116   PROT 6.7 04/28/2023 1030   PROT 7.3 05/21/2020 1145   ALBUMIN 4.0 04/28/2023 1030   ALBUMIN 4.4 05/21/2020 1145   AST 10 04/28/2023 1030   ALT 15 04/28/2023 1030   ALKPHOS 115 04/28/2023 1030   ALKPHOS 100 04/28/2023 1030   BILITOT 0.6 04/28/2023 1030   BILITOT 0.4 05/21/2020 1145   GFR 88.23 04/15/2023 1116   GFRNONAA >60 12/14/2021 0213     No results found.     Assessment & Plan:   1. Pain in both lower extremities (Primary)  Recommend:  The patient has atypical pain symptoms for pure atherosclerotic disease. However, on physical exam there is evidence of vascular disease, given the diminished pulses and the edema of the legs.  Further investigation of the patient's vascular disease is necessary to determine the relationship of the patient's lower extremity symptoms and the degree of vascular disease.  Noninvasive studies of the will be obtained and the patient will follow up with me to review these studies.  I suspect the patient is c/o neuropathic symptoms.  Further workup can be done pending ultrasound results  The patient should continue walking and begin a more formal exercise program. The patient should continue his antiplatelet therapy and aggressive treatment of the lipid abnormalities.  2. Hypertension, unspecified type Continue antihypertensive medications as already ordered, these medications have been reviewed and there are no  changes at this time.  3. Type 2 diabetes mellitus with other circulatory complication, unspecified whether long term insulin use (HCC) Continue hypoglycemic medications as already ordered, these medications have been reviewed and there are no changes at this time.  Hgb A1C to be monitored as already arranged by primary service   Current Outpatient Medications on File Prior to Visit  Medication Sig Dispense Refill  . glucose blood (FREESTYLE LITE) test strip Used in conjunction with insulin pump to check blood sugars 4 x daily. (Patient not taking: Reported on 04/15/2023) 200 each 12  . insulin lispro (HUMALOG) 100 UNIT/ML injection Take up to 150 units daily via insulin pump 140 mL 3  . atorvastatin (LIPITOR) 80 MG tablet Take 1 tablet (80 mg total) by mouth daily. 90 tablet 3  . Blood Glucose Monitoring Suppl (FREESTYLE FREEDOM LITE) w/Device KIT Used in conjunction with insulin pump to check blood sugars 4 x daily. (Patient not taking: Reported on 04/15/2023) 1 kit 0  . Cholecalciferol 1.25 MG (50000 UT) capsule Take 1 capsule by mouth once weekly for 8 weeks. 8 capsule 0  . clopidogrel (PLAVIX) 75 MG tablet Take 1 tablet (75 mg total) by mouth daily. 90 tablet 3  . ezetimibe (ZETIA) 10 MG tablet Take 1 tablet (10 mg total) by mouth once daily 90 tablet 3  . furosemide (LASIX) 20 MG tablet Take 1 tablet (20 mg total) by mouth daily. 90 tablet 3  . gabapentin (NEURONTIN) 600 MG tablet Take 3 tablets (1,800 mg total) by mouth at bedtime. 270 tablet 3  . Insulin Pen Needle (UNIFINE PENTIPS) 31G X 5 MM MISC Use as directed with Humalog 3 times a day  100 each 0  . Lancets (FREESTYLE) lancets Used in conjunction with insulin pump to check blood sugars 4 x daily. (Patient not taking: Reported on 04/15/2023) 200 each 12  . lidocaine (LIDODERM) 5 % Place 1 patch onto the skin daily. Remove & Discard patch within 12 hours. 30 patch 0  . metoprolol succinate (TOPROL-XL) 100 MG 24 hr tablet Take 1  tablet (100 mg total) by mouth nightly. 90 tablet 3  . rivaroxaban (XARELTO) 2.5 MG TABS tablet Take 1 tablet (2.5 mg total) by mouth 2 (two) times daily. 180 tablet 3  . sacubitril-valsartan (ENTRESTO) 24-26 MG Take 1 tablet by mouth 2 (two) times daily. 180 tablet 3  . scopolamine (TRANSDERM-SCOP) 1 MG/3DAYS Place 1 patch (1.5 mg total) onto the skin every 3 (three) days. 10 patch 0  . sertraline (ZOLOFT) 50 MG tablet Take 1 tablet (50 mg total) by mouth at bedtime. 90 tablet 3  . spironolactone (ALDACTONE) 25 MG tablet Take 0.5 tablets (12.5 mg total) by mouth every morning. 45 tablet 3   No current facility-administered medications on file prior to visit.    There are no Patient Instructions on file for this visit. No follow-ups on file.   Georgiana Spinner, NP

## 2023-06-20 NOTE — Progress Notes (Signed)
Subjective:    Patient ID: Jose Merritt, male    DOB: July 11, 1975, 48 y.o.   MRN: [id number] Chief Complaint  Patient presents with   New Patient (Initial Visit)    Specialty Services Required Type 2 diabetes mellitus with other circulatory complication, unspecified whether long term insulin use    The patient presents for evaluation due to bilateral lower extremity leg pain.  The patient has had this leg pain for an extensive amount of time.  He notes the pain is in his bilateral lower extremities as well as in his feet.  At times it can be an aching and throbbing and sometimes he has claudication-like symptoms.  There is also some swelling associated.  He has a longstanding history of diabetes with neuropathy as well.  Currently there are no open wounds or ulcerations.    Review of Systems  Cardiovascular:  Positive for leg swelling.  Neurological:  Positive for numbness.  All other systems reviewed and are negative.      Objective:   Physical Exam Vitals reviewed.  HENT:     Head: Normocephalic.  Cardiovascular:     Rate and Rhythm: Normal rate.  Pulmonary:     Effort: Pulmonary effort is normal.  Musculoskeletal:     Right lower leg: Edema present.     Left lower leg: Edema present.  Skin:    General: Skin is warm and dry.  Neurological:     Mental Status: He is alert and oriented to person, place, and time.  Psychiatric:        Mood and Affect: Mood normal.        Behavior: Behavior normal.        Thought Content: Thought content normal.        Judgment: Judgment normal.     BP 119/75   Pulse 76   Resp 18   Ht 6' (1.829 m)   Wt 213 lb 6.4 oz (96.8 kg)   BMI 28.94 kg/m   Past Medical History:  Diagnosis Date   Asthma    Bipolar disorder (HCC)    CHF (congestive heart failure) (HCC)    Chicken pox    Clotting disorder (HCC)    lung   Depression    Diabetes mellitus without complication (HCC) 2003   Type 2   Heart murmur    as child    Hyperlipidemia    Hypertension    Myocardial infarction (HCC) 06/2015   Pulmonary embolism (HCC) 2018   Retinopathy of both eyes 01/2016   SOB (shortness of breath) 08/17/2016   Stroke (HCC)     Social History   Socioeconomic History   Marital status: Married    Spouse name: Micai Anuszewski   Number of children: 1   Years of education: Not on file   Highest education level: Associate degree: occupational, Scientist, product/process development, or vocational program  Occupational History   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 [employer]      Social Drivers of [employer]: Low Risk  (05/01/2017)   Overall Financial Resource Strain (CARDIA)    Difficulty of Paying Living Expenses: Not hard at all  Food Insecurity: No Food Insecurity (05/01/2017)   Hunger Vital Sign    Worried About Running Out of Food in the Last Year: Never true    Ran Out of Food in the Last Year: Never true  Transportation Needs: No Transportation Needs (05/01/2017)   PRAPARE - Administrator, Civil Service (Medical): No    Lack of Transportation (Non-Medical): No  Physical Activity: Inactive (05/01/2017)   Exercise Vital Sign    Days of Exercise per Week: 0 days    Minutes of Exercise per Session: 0 min  Stress: No Stress Concern Present (05/01/2017)   [employer] of Occupational Health - Occupational Stress Questionnaire    Feeling of Stress : Not at all  Social Connections: Moderately Integrated (05/01/2017)   Social Connection and Isolation Panel  [NHANES]    Frequency of Communication with Friends and Family: More than three times a week    Frequency of Social Gatherings with Friends and Family: Once a week    Attends Religious Services: More than 4 times per year    Active Member of [employer] or Organizations: No    Attends Banker Meetings: Never    Marital Status: Married  Catering manager Violence: Not At Risk (09/20/2017)   Humiliation, Afraid, Rape, and Kick questionnaire    Fear of Current or Ex-Partner: No    Emotionally Abused: No    Physically Abused: No    Sexually Abused: No    Past Surgical History:  Procedure Laterality Date   CORONARY ANGIOPLASTY WITH STENT PLACEMENT Left 06/2015   LADx1 stent   WISDOM TOOTH EXTRACTION      Family History  Problem Relation Age of Onset   Hypertension Mother    Arthritis Mother    Heart disease Father        3 heart attacks   Diabetes Father    Hypertension Father    Hyperlipidemia Father    Alcohol abuse Father    Hypertension Brother    Hyperlipidemia Brother    Heart disease Maternal Uncle    Heart disease Paternal Aunt    Hypertension Maternal Grandmother    Kidney disease Maternal Grandmother    Arthritis Maternal Grandmother    Heart disease Maternal Grandfather    Hyperlipidemia Maternal Grandfather    Diabetes Maternal Grandfather    Kidney disease Maternal Grandfather    Heart disease Paternal Grandmother    Kidney disease Paternal Grandmother    Kidney disease Paternal Grandfather    Colon cancer Neg Hx     Allergies  Allergen Reactions   Metrizamide Anaphylaxis   Other Anaphylaxis    Shrimp Shrimp Shrimp   Statins Anaphylaxis    Uncoded Allergy. Allergen: CT contrast dye. Tolerated contrast for heart catheterization 07/2015   Sulfa Antibiotics Anaphylaxis    Uncoded Allergy. Allergen: CT contrast dye. Tolerated contrast for heart catheterization 07/2015   Sulfamethoxazole-Trimethoprim Anaphylaxis    Uncoded Allergy. Allergen: CT  contrast dye. Tolerated contrast for heart catheterization 07/2015   Sulfasalazine Anaphylaxis    Uncoded Allergy. Allergen: CT contrast dye. Tolerated contrast for heart catheterization 07/2015   Cephalosporins    Contrast Media [Iodinated Contrast Media]    Shrimp (Diagnostic)  Latest Ref Rng & Units 04/15/2023   11:16 AM 12/14/2021    2:13 AM 06/30/2021    4:06 PM  CBC  WBC 4.0 - 10.5 K/uL 6.1  8.0  5.4   Hemoglobin 13.0 - 17.0 g/dL 09.8  11.9  14.7   Hematocrit 39.0 - 52.0 % 40.4  41.6  43.0   Platelets 150.0 - 400.0 K/uL 239.0  223  196       CMP     Component Value Date/Time   NA 134 (L) 04/15/2023 1116   NA 133 (L) 05/21/2020 1145   K 5.0 04/15/2023 1116   CL 100 04/15/2023 1116   CO2 26 04/15/2023 1116   GLUCOSE 375 (H) 04/15/2023 1116   BUN 27 (H) 04/15/2023 1116   BUN 19 05/21/2020 1145   CREATININE 1.00 04/15/2023 1116   CREATININE 0.93 06/20/2021 1545   CALCIUM 9.3 04/15/2023 1116   PROT 6.7 04/28/2023 1030   PROT 7.3 05/21/2020 1145   ALBUMIN 4.0 04/28/2023 1030   ALBUMIN 4.4 05/21/2020 1145   AST 10 04/28/2023 1030   ALT 15 04/28/2023 1030   ALKPHOS 115 04/28/2023 1030   ALKPHOS 100 04/28/2023 1030   BILITOT 0.6 04/28/2023 1030   BILITOT 0.4 05/21/2020 1145   GFR 88.23 04/15/2023 1116   GFRNONAA >60 12/14/2021 0213     No results found.     Assessment & Plan:   1. Pain in both lower extremities (Primary)  Recommend:  The patient has atypical pain symptoms for pure atherosclerotic disease. However, on physical exam there is evidence of vascular disease, given the diminished pulses and the edema of the legs.  Further investigation of the patient's vascular disease is necessary to determine the relationship of the patient's lower extremity symptoms and the degree of vascular disease.  Noninvasive studies of the will be obtained and the patient will follow up with me to review these studies.  I suspect the patient is c/o neuropathic  symptoms.  Further workup can be done pending ultrasound results  The patient should continue walking and begin a more formal exercise program. The patient should continue his antiplatelet therapy and aggressive treatment of the lipid abnormalities.  2. Hypertension, unspecified type Continue antihypertensive medications as already ordered, these medications have been reviewed and there are no changes at this time.  3. Type 2 diabetes mellitus with other circulatory complication, unspecified whether long term insulin use (HCC) Continue hypoglycemic medications as already ordered, these medications have been reviewed and there are no changes at this time.  Hgb A1C to be monitored as already arranged by primary service   Current Outpatient Medications on File Prior to Visit  Medication Sig Dispense Refill   glucose blood (FREESTYLE LITE) test strip Used in conjunction with insulin pump to check blood sugars 4 x daily. (Patient not taking: Reported on 04/15/2023) 200 each 12   insulin lispro (HUMALOG) 100 UNIT/ML injection Take up to 150 units daily via insulin pump 140 mL 3   atorvastatin (LIPITOR) 80 MG tablet Take 1 tablet (80 mg total) by mouth daily. 90 tablet 3   Blood Glucose Monitoring Suppl (FREESTYLE FREEDOM LITE) w/Device KIT Used in conjunction with insulin pump to check blood sugars 4 x daily. (Patient not taking: Reported on 04/15/2023) 1 kit 0   Cholecalciferol 1.25 MG (50000 UT) capsule Take 1 capsule by mouth once weekly for 8 weeks. 8 capsule 0   clopidogrel (PLAVIX) 75 MG tablet Take 1 tablet (75 mg total) by mouth  daily. 90 tablet 3   ezetimibe (ZETIA) 10 MG tablet Take 1 tablet (10 mg total) by mouth once daily 90 tablet 3   furosemide (LASIX) 20 MG tablet Take 1 tablet (20 mg total) by mouth daily. 90 tablet 3   gabapentin (NEURONTIN) 600 MG tablet Take 3 tablets (1,800 mg total) by mouth at bedtime. 270 tablet 3   Insulin Pen Needle (UNIFINE PENTIPS) 31G X 5 MM MISC Use  as directed with Humalog 3 times a day 100 each 0   Lancets (FREESTYLE) lancets Used in conjunction with insulin pump to check blood sugars 4 x daily. (Patient not taking: Reported on 04/15/2023) 200 each 12   lidocaine (LIDODERM) 5 % Place 1 patch onto the skin daily. Remove & Discard patch within 12 hours. 30 patch 0   metoprolol succinate (TOPROL-XL) 100 MG 24 hr tablet Take 1 tablet (100 mg total) by mouth nightly. 90 tablet 3   rivaroxaban (XARELTO) 2.5 MG TABS tablet Take 1 tablet (2.5 mg total) by mouth 2 (two) times daily. 180 tablet 3   sacubitril-valsartan (ENTRESTO) 24-26 MG Take 1 tablet by mouth 2 (two) times daily. 180 tablet 3   scopolamine (TRANSDERM-SCOP) 1 MG/3DAYS Place 1 patch (1.5 mg total) onto the skin every 3 (three) days. 10 patch 0   sertraline (ZOLOFT) 50 MG tablet Take 1 tablet (50 mg total) by mouth at bedtime. 90 tablet 3   spironolactone (ALDACTONE) 25 MG tablet Take 0.5 tablets (12.5 mg total) by mouth every morning. 45 tablet 3   No current facility-administered medications on file prior to visit.    There are no Patient Instructions on file for this visit. No follow-ups on file.   Georgiana Spinner, NP

## 2023-06-21 ENCOUNTER — Other Ambulatory Visit (INDEPENDENT_AMBULATORY_CARE_PROVIDER_SITE_OTHER): Payer: Self-pay | Admitting: Nurse Practitioner

## 2023-06-21 ENCOUNTER — Other Ambulatory Visit: Payer: Self-pay

## 2023-06-21 DIAGNOSIS — M79605 Pain in left leg: Secondary | ICD-10-CM

## 2023-06-21 DIAGNOSIS — M79604 Pain in right leg: Secondary | ICD-10-CM

## 2023-06-23 ENCOUNTER — Ambulatory Visit (INDEPENDENT_AMBULATORY_CARE_PROVIDER_SITE_OTHER): Payer: Commercial Managed Care - PPO

## 2023-06-23 DIAGNOSIS — M79605 Pain in left leg: Secondary | ICD-10-CM

## 2023-06-23 DIAGNOSIS — M79604 Pain in right leg: Secondary | ICD-10-CM | POA: Diagnosis not present

## 2023-06-29 ENCOUNTER — Other Ambulatory Visit: Payer: Self-pay

## 2023-06-29 ENCOUNTER — Other Ambulatory Visit: Payer: Self-pay | Admitting: Family

## 2023-06-29 ENCOUNTER — Encounter: Payer: Self-pay | Admitting: Family

## 2023-06-29 DIAGNOSIS — T753XXA Motion sickness, initial encounter: Secondary | ICD-10-CM

## 2023-06-29 MED ORDER — ONDANSETRON 4 MG PO TBDP
4.0000 mg | ORAL_TABLET | Freq: Three times a day (TID) | ORAL | 0 refills | Status: DC | PRN
  Filled 2023-06-29: qty 20, 7d supply, fill #0

## 2023-06-29 MED ORDER — SCOPOLAMINE 1 MG/3DAYS TD PT72
1.0000 | MEDICATED_PATCH | TRANSDERMAL | 0 refills | Status: DC
  Filled 2023-06-29: qty 10, 30d supply, fill #0

## 2023-06-30 ENCOUNTER — Other Ambulatory Visit: Payer: Self-pay

## 2023-06-30 MED ORDER — INSULIN LISPRO 100 UNIT/ML IJ SOLN
Freq: Every day | INTRAMUSCULAR | 0 refills | Status: DC
  Filled 2023-06-30: qty 120, 84d supply, fill #0
  Filled 2024-01-16 – 2024-02-25 (×2): qty 120, 84d supply, fill #1

## 2023-06-30 MED ORDER — INSULIN LISPRO 100 UNIT/ML IJ SOLN
150.0000 [IU] | Freq: Every day | INTRAMUSCULAR | 0 refills | Status: DC
  Filled 2023-06-30: qty 140, 93d supply, fill #0
  Filled 2024-01-16: qty 120, 84d supply, fill #0

## 2023-07-29 ENCOUNTER — Ambulatory Visit (INDEPENDENT_AMBULATORY_CARE_PROVIDER_SITE_OTHER): Payer: Commercial Managed Care - PPO

## 2023-07-29 ENCOUNTER — Encounter (INDEPENDENT_AMBULATORY_CARE_PROVIDER_SITE_OTHER): Payer: Self-pay | Admitting: Nurse Practitioner

## 2023-07-29 ENCOUNTER — Ambulatory Visit (INDEPENDENT_AMBULATORY_CARE_PROVIDER_SITE_OTHER): Payer: Commercial Managed Care - PPO | Admitting: Nurse Practitioner

## 2023-07-29 VITALS — BP 110/73 | HR 87 | Resp 16 | Wt 203.6 lb

## 2023-07-29 DIAGNOSIS — M79604 Pain in right leg: Secondary | ICD-10-CM

## 2023-07-29 DIAGNOSIS — M79605 Pain in left leg: Secondary | ICD-10-CM

## 2023-07-29 DIAGNOSIS — E1159 Type 2 diabetes mellitus with other circulatory complications: Secondary | ICD-10-CM | POA: Diagnosis not present

## 2023-07-29 DIAGNOSIS — I10 Essential (primary) hypertension: Secondary | ICD-10-CM | POA: Diagnosis not present

## 2023-07-29 NOTE — Progress Notes (Signed)
 Subjective:    Patient ID: Jose Merritt, male    DOB: 13-Nov-1975, 48 y.o.   MRN: [id number] Chief Complaint  Patient presents with   Follow-up    Ultrasound follow up    The patient presents for evaluation due to bilateral lower extremity leg pain.  The patient has had this leg pain for an extensive amount of time.  He notes the pain is in his bilateral lower extremities as well as in his feet.  At times it can be an aching and throbbing and sometimes he has claudication-like symptoms.  There is also some swelling associated.  He has a longstanding history of diabetes with neuropathy as well.  Currently there are no open wounds or ulcerations.  Today noninvasive studies show the patient has no evidence of DVT or superficial thrombophlebitis bilaterally.  No evidence of deep venous insufficiency or superficial venous reflux bilaterally.  He had an ABI of 1.21 on the right and 1.24 on the left.  He had normal toe pressures bilaterally.  In addition he had strong multiphasic waveforms in the bilateral upper extremities with his arterial duplex.  He also has strong toe waveforms bilaterally.    Review of Systems  Cardiovascular:  Positive for leg swelling.  Neurological:  Positive for numbness.  All other systems reviewed and are negative.      Objective:   Physical Exam Vitals reviewed.  HENT:     Head: Normocephalic.  Cardiovascular:     Rate and Rhythm: Normal rate.  Pulmonary:     Effort: Pulmonary effort is normal.  Musculoskeletal:     Right lower leg: Edema present.     Left lower leg: Edema present.  Skin:    General: Skin is warm and dry.  Neurological:     Mental Status: He is alert and oriented to person, place, and time.  Psychiatric:        Mood and Affect: Mood normal.        Behavior: Behavior normal.        Thought Content: Thought content normal.        Judgment: Judgment normal.     BP 110/73   Pulse 87   Resp 16   Wt 203 lb 9.6 oz (92.4  kg)   BMI 27.61 kg/m   Past Medical History:  Diagnosis Date   Asthma    Bipolar disorder (HCC)    CHF (congestive heart failure) (HCC)    Chicken pox    Clotting disorder (HCC)    lung   Depression    Diabetes mellitus without complication (HCC) 2003   Type 2   Heart murmur    as child   Hyperlipidemia    Hypertension    Myocardial infarction (HCC) 06/2015   Pulmonary embolism (HCC) 2018   Retinopathy of both eyes 01/2016   SOB (shortness of breath) 08/17/2016   Stroke (HCC)     Social History   Socioeconomic History   Marital status: Married    Spouse name: Maaz Spiering   Number of children: 1   Years of education: Not on file   Highest education level: Associate degree: occupational, Scientist, product/process development, or vocational program  Occupational History   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    Comment: child in daycare qam  Tobacco Use   Smoking status: Never   Smokeless tobacco: Never  Vaping Use   Vaping status: Never Used  Substance and Sexual Activity   Alcohol use: No  Comment: rare- 2x/year   Drug use: No   Sexual activity: Yes    Comment: impotence post heart attack  Other Topics Concern   Not on file  Social History Narrative   Wife is CNM at [employer]      [facility] Security- 4 yo daughter      Linton Ham from [facility]      Social Drivers of Health   Financial Resource Strain: Low Risk  (05/01/2017)   Overall Financial Resource Strain (CARDIA)    Difficulty of Paying Living Expenses: Not hard at all  Food Insecurity: No Food Insecurity (05/01/2017)   Hunger Vital Sign    Worried About Running Out of Food in the Last Year: Never true    Ran Out of Food in the Last Year: Never true  Transportation Needs: No Transportation Needs (05/01/2017)   PRAPARE - Administrator, Civil Service (Medical): No    Lack of Transportation (Non-Medical): No  Physical Activity: Inactive (05/01/2017)   Exercise Vital Sign    Days of Exercise per Week: 0  days    Minutes of Exercise per Session: 0 min  Stress: No Stress Concern Present (05/01/2017)   [employer] of Occupational Health - Occupational Stress Questionnaire    Feeling of Stress : Not at all  Social Connections: Moderately Integrated (05/01/2017)   Social Connection and Isolation Panel [NHANES]    Frequency of Communication with Friends and Family: More than three times a week    Frequency of Social Gatherings with Friends and Family: Once a week    Attends Religious Services: More than 4 times per year    Active Member of [employer] or Organizations: No    Attends Banker Meetings: Never    Marital Status: Married  Catering manager Violence: Not At Risk (09/20/2017)   Humiliation, Afraid, Rape, and Kick questionnaire    Fear of Current or Ex-Partner: No    Emotionally Abused: No    Physically Abused: No    Sexually Abused: No    Past Surgical History:  Procedure Laterality Date   CORONARY ANGIOPLASTY WITH STENT PLACEMENT Left 06/2015   LADx1 stent   WISDOM TOOTH EXTRACTION      Family History  Problem Relation Age of Onset   Hypertension Mother    Arthritis Mother    Heart disease Father        3 heart attacks   Diabetes Father    Hypertension Father    Hyperlipidemia Father    Alcohol abuse Father    Hypertension Brother    Hyperlipidemia Brother    Heart disease Maternal Uncle    Heart disease Paternal Aunt    Hypertension Maternal Grandmother    Kidney disease Maternal Grandmother    Arthritis Maternal Grandmother    Heart disease Maternal Grandfather    Hyperlipidemia Maternal Grandfather    Diabetes Maternal Grandfather    Kidney disease Maternal Grandfather    Heart disease Paternal Grandmother    Kidney disease Paternal Grandmother    Kidney disease Paternal Grandfather    Colon cancer Neg Hx     Allergies  Allergen Reactions   Metrizamide Anaphylaxis   Other Anaphylaxis    Shrimp Shrimp Shrimp   Statins Anaphylaxis     Uncoded Allergy. Allergen: CT contrast dye. Tolerated contrast for heart catheterization 07/2015   Sulfa Antibiotics Anaphylaxis    Uncoded Allergy. Allergen: CT contrast dye. Tolerated contrast for heart catheterization 07/2015   Sulfamethoxazole-Trimethoprim Anaphylaxis  Uncoded Allergy. Allergen: CT contrast dye. Tolerated contrast for heart catheterization 07/2015   Sulfasalazine Anaphylaxis    Uncoded Allergy. Allergen: CT contrast dye. Tolerated contrast for heart catheterization 07/2015   Cephalosporins    Contrast Media [Iodinated Contrast Media]    Shrimp (Diagnostic)        Latest Ref Rng & Units 04/15/2023   11:16 AM 12/14/2021    2:13 AM 06/30/2021    4:06 PM  CBC  WBC 4.0 - 10.5 K/uL 6.1  8.0  5.4   Hemoglobin 13.0 - 17.0 g/dL 37.6  28.3  15.1   Hematocrit 39.0 - 52.0 % 40.4  41.6  43.0   Platelets 150.0 - 400.0 K/uL 239.0  223  196       CMP     Component Value Date/Time   NA 134 (L) 04/15/2023 1116   NA 133 (L) 05/21/2020 1145   K 5.0 04/15/2023 1116   CL 100 04/15/2023 1116   CO2 26 04/15/2023 1116   GLUCOSE 375 (H) 04/15/2023 1116   BUN 27 (H) 04/15/2023 1116   BUN 19 05/21/2020 1145   CREATININE 1.00 04/15/2023 1116   CREATININE 0.93 06/20/2021 1545   CALCIUM 9.3 04/15/2023 1116   PROT 6.7 04/28/2023 1030   PROT 7.3 05/21/2020 1145   ALBUMIN 4.0 04/28/2023 1030   ALBUMIN 4.4 05/21/2020 1145   AST 10 04/28/2023 1030   ALT 15 04/28/2023 1030   ALKPHOS 115 04/28/2023 1030   ALKPHOS 100 04/28/2023 1030   BILITOT 0.6 04/28/2023 1030   BILITOT 0.4 05/21/2020 1145   GFR 88.23 04/15/2023 1116   GFRNONAA >60 12/14/2021 0213     No results found.     Assessment & Plan:   1. Pain in both lower extremities (Primary) Recommend:  The patient has atypical pain symptoms for vascular disease and on exam I do not find evidence of vascular pathology that would explain the patient's symptoms.  Noninvasive studies do not identify significant vascular  problems  I suspect that much of his issues are related to his neuropathy but given the fact that some of the symptoms are claudication-like in nature I feel it would also be prudent to evaluate his lower back just to ensure that there are not any additional treatable options.  The patient should continue walking and begin a more formal exercise program. The patient should continue his antiplatelet therapy and aggressive treatment of the lipid abnormalities.  Patient will follow-up with me on a PRN basis.  2. Hypertension, unspecified type Continue antihypertensive medications as already ordered, these medications have been reviewed and there are no changes at this time.  3. Type 2 diabetes mellitus with other circulatory complication, unspecified whether long term insulin use (HCC) Continue hypoglycemic medications as already ordered, these medications have been reviewed and there are no changes at this time.  Hgb A1C to be monitored as already arranged by primary service   Current Outpatient Medications on File Prior to Visit  Medication Sig Dispense Refill   atorvastatin (LIPITOR) 80 MG tablet Take 1 tablet (80 mg total) by mouth daily. 90 tablet 3   Cholecalciferol 1.25 MG (50000 UT) capsule Take 1 capsule by mouth once weekly for 8 weeks. 8 capsule 0   clopidogrel (PLAVIX) 75 MG tablet Take 1 tablet (75 mg total) by mouth daily. 90 tablet 3   ezetimibe (ZETIA) 10 MG tablet Take 1 tablet (10 mg total) by mouth once daily 90 tablet 3   furosemide (LASIX) 20 MG  tablet Take 1 tablet (20 mg total) by mouth daily. 90 tablet 3   gabapentin (NEURONTIN) 600 MG tablet Take 3 tablets (1,800 mg total) by mouth at bedtime. 270 tablet 3   glucose blood (FREESTYLE LITE) test strip Used in conjunction with insulin pump to check blood sugars 4 x daily. (Patient not taking: Reported on 07/29/2023) 200 each 12   insulin lispro (HUMALOG) 100 UNIT/ML injection Take up to 150 units daily via insulin pump  140 mL 3   insulin lispro (HUMALOG) 100 UNIT/ML injection Use up to 150 units daily via insulin pump.  Please schedule office visit for further refills. 140 mL 0   insulin lispro (HUMALOG) 100 UNIT/ML injection Inject 1.5 mLs (150 Units total) into the skin daily. 140 mL 0   Insulin Pen Needle (UNIFINE PENTIPS) 31G X 5 MM MISC Use as directed with Humalog 3 times a day 100 each 0   lidocaine (LIDODERM) 5 % Place 1 patch onto the skin daily. Remove & Discard patch within 12 hours. 30 patch 0   metoprolol succinate (TOPROL-XL) 100 MG 24 hr tablet Take 1 tablet (100 mg total) by mouth nightly. 90 tablet 3   nitroGLYCERIN (NITROSTAT) 0.3 MG SL tablet Place 1 tablet (0.3 mg total) under the tongue every 5 (five) minutes as needed. 25 tablet 3   ondansetron (ZOFRAN-ODT) 4 MG disintegrating tablet Take 1 tablet (4 mg total) by mouth every 8 (eight) hours as needed for nausea or vomiting. 20 tablet 0   rivaroxaban (XARELTO) 2.5 MG TABS tablet Take 1 tablet (2.5 mg total) by mouth 2 (two) times daily. 180 tablet 3   sacubitril-valsartan (ENTRESTO) 24-26 MG Take 1 tablet by mouth 2 (two) times daily. 180 tablet 3   scopolamine (TRANSDERM-SCOP) 1 MG/3DAYS Place 1 patch (1.5 mg total) onto the skin every 3 (three) days. 10 patch 0   sertraline (ZOLOFT) 50 MG tablet Take 1 tablet (50 mg total) by mouth at bedtime. 90 tablet 3   spironolactone (ALDACTONE) 25 MG tablet Take 0.5 tablets (12.5 mg total) by mouth every morning. 45 tablet 3   Blood Glucose Monitoring Suppl (FREESTYLE FREEDOM LITE) w/Device KIT Used in conjunction with insulin pump to check blood sugars 4 x daily. (Patient not taking: Reported on 07/29/2023) 1 kit 0   Lancets (FREESTYLE) lancets Used in conjunction with insulin pump to check blood sugars 4 x daily. (Patient not taking: Reported on 07/29/2023) 200 each 12   No current facility-administered medications on file prior to visit.    There are no Patient Instructions on file for this  visit. No follow-ups on file.   Georgiana Spinner, NP

## 2023-07-30 ENCOUNTER — Encounter (INDEPENDENT_AMBULATORY_CARE_PROVIDER_SITE_OTHER): Payer: Self-pay | Admitting: Nurse Practitioner

## 2023-08-03 ENCOUNTER — Other Ambulatory Visit: Payer: Commercial Managed Care - PPO

## 2023-08-06 ENCOUNTER — Ambulatory Visit: Payer: Commercial Managed Care - PPO | Admitting: "Endocrinology

## 2023-10-19 ENCOUNTER — Encounter (INDEPENDENT_AMBULATORY_CARE_PROVIDER_SITE_OTHER): Payer: Self-pay

## 2023-10-30 IMAGING — CR DG CHEST 2V
2 series · 2 of 2 positions shown · non-contrast
Comparison: CT AP 10/30/2020

CLINICAL DATA: Shortness of breath.

EXAM:
CHEST - 2 VIEW

[chest pa]
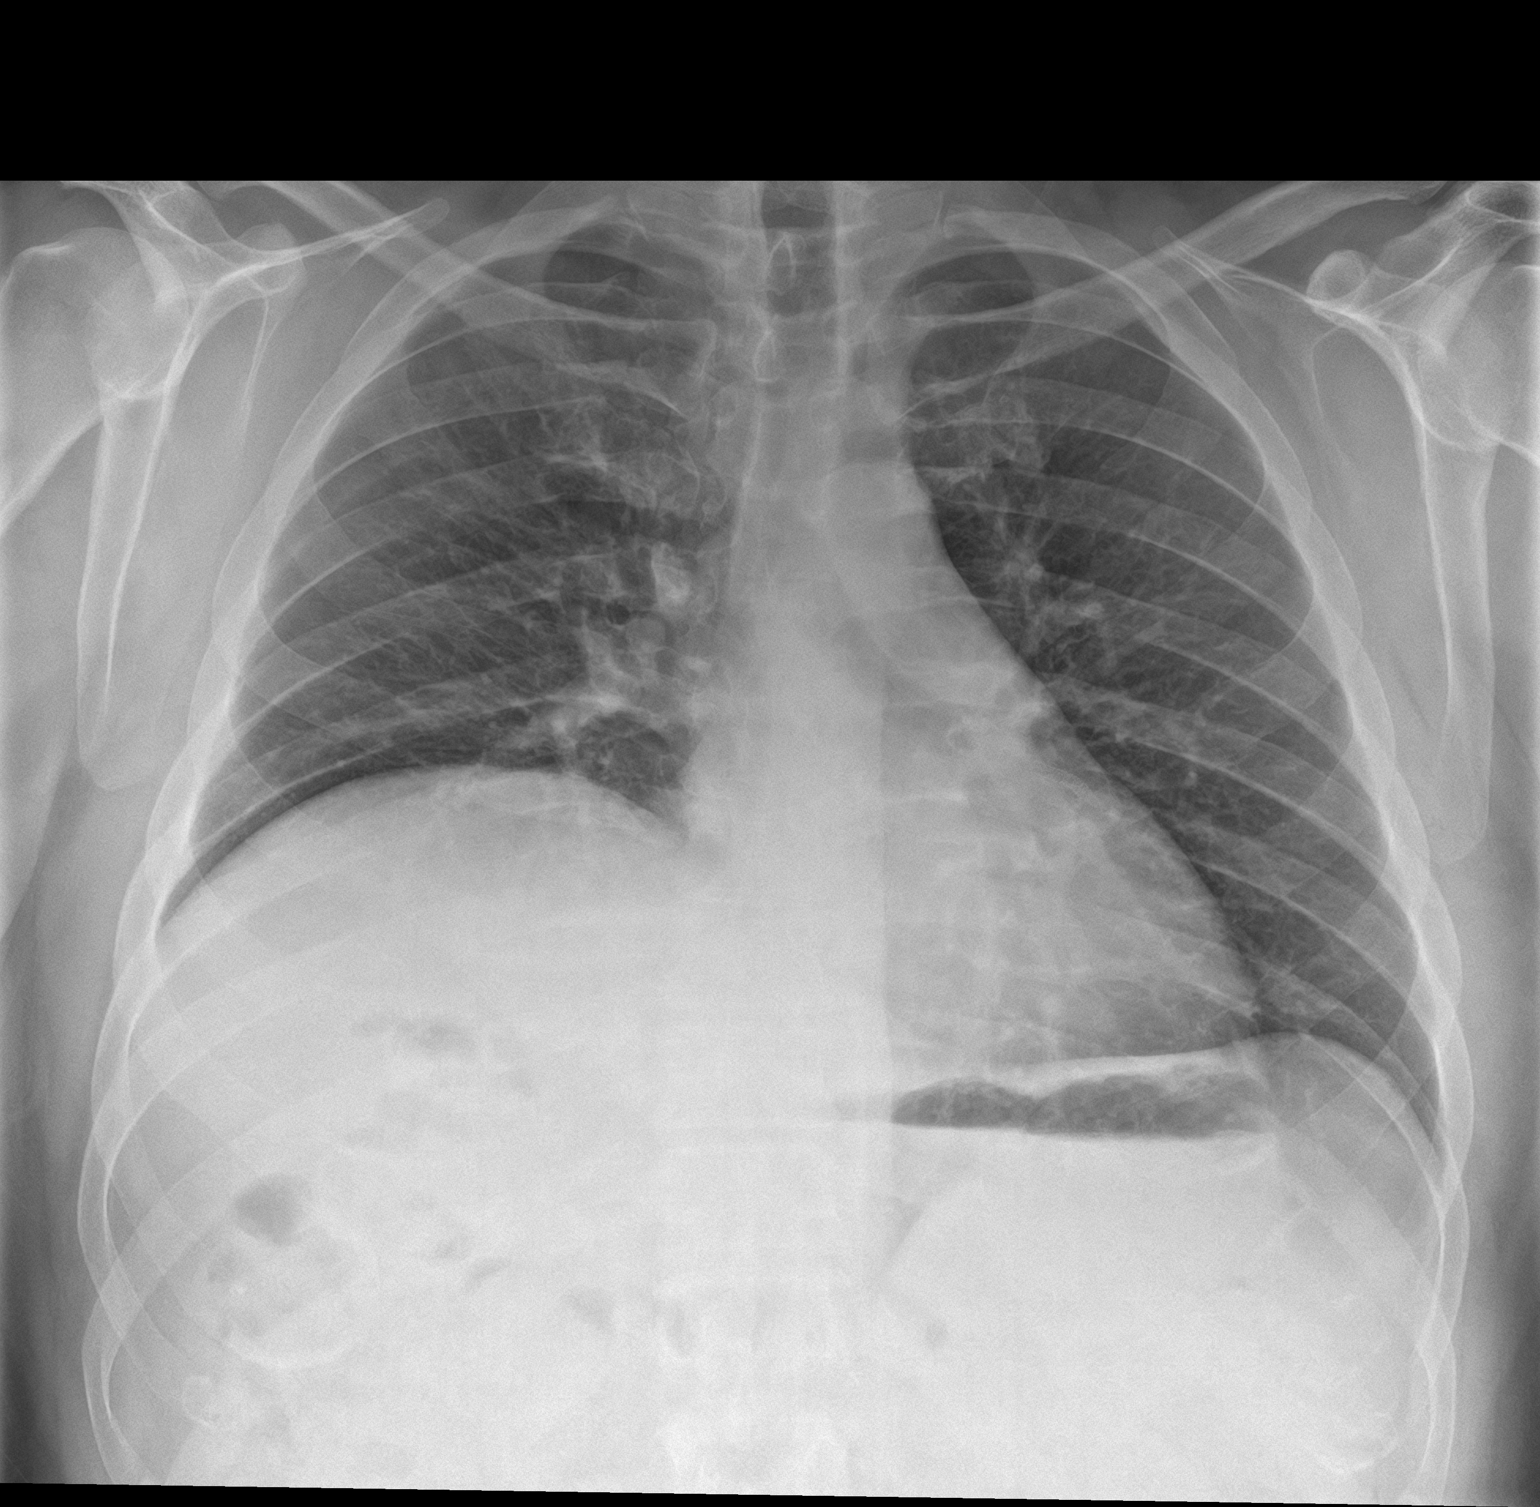

[chest lat]
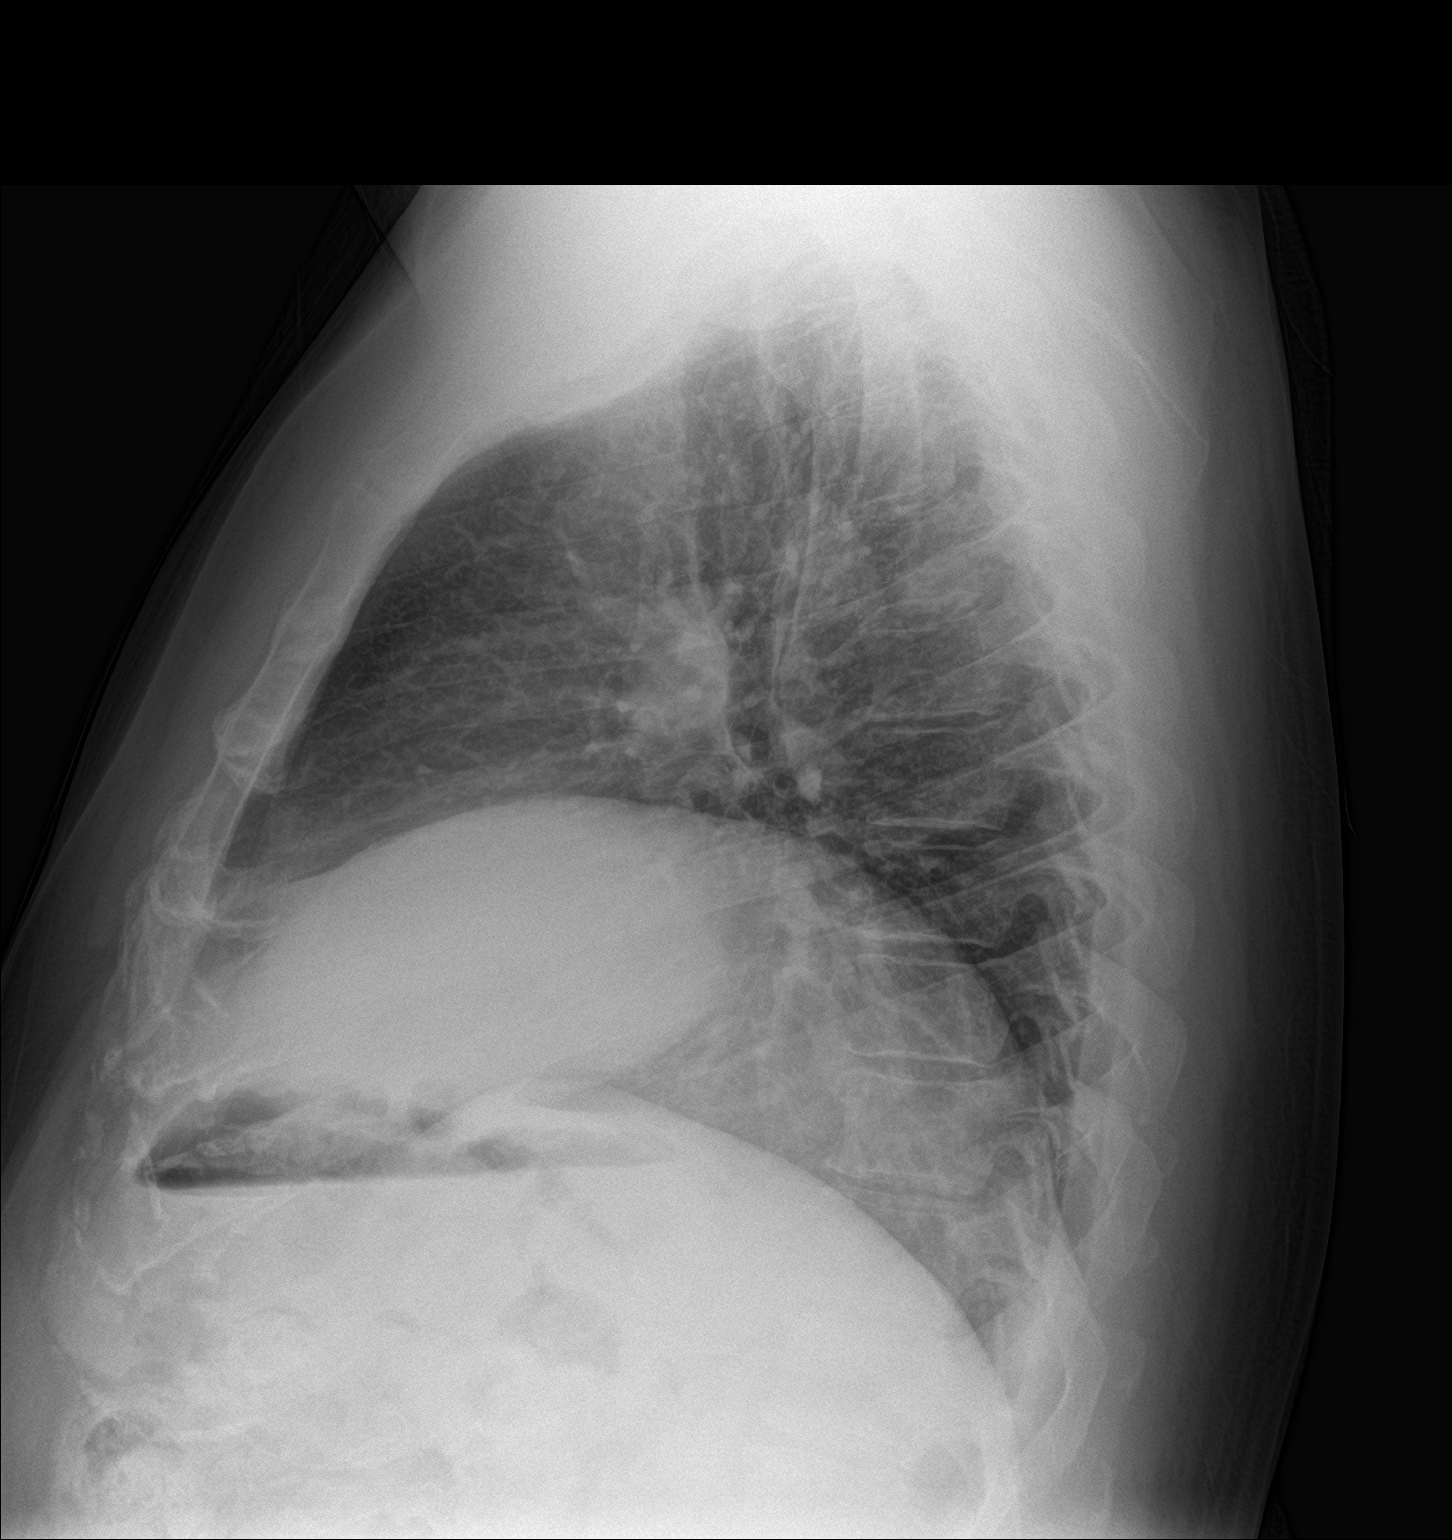

[2 of 2 positions shown; findings below may reference images not displayed]

FINDINGS: Unchanged chronic elevation of the right hemidiaphragm. Normal heart
size. No signs of pleural effusion or edema. No airspace
consolidation identified. Visualized osseous structures are
unremarkable.
IMPRESSION: No active cardiopulmonary abnormalities.

## 2023-12-01 DIAGNOSIS — D2261 Melanocytic nevi of right upper limb, including shoulder: Secondary | ICD-10-CM | POA: Diagnosis not present

## 2023-12-01 DIAGNOSIS — D225 Melanocytic nevi of trunk: Secondary | ICD-10-CM | POA: Diagnosis not present

## 2023-12-01 DIAGNOSIS — L821 Other seborrheic keratosis: Secondary | ICD-10-CM | POA: Diagnosis not present

## 2023-12-01 DIAGNOSIS — L570 Actinic keratosis: Secondary | ICD-10-CM | POA: Diagnosis not present

## 2023-12-01 DIAGNOSIS — D485 Neoplasm of uncertain behavior of skin: Secondary | ICD-10-CM | POA: Diagnosis not present

## 2023-12-01 DIAGNOSIS — D2262 Melanocytic nevi of left upper limb, including shoulder: Secondary | ICD-10-CM | POA: Diagnosis not present

## 2023-12-01 DIAGNOSIS — D2371 Other benign neoplasm of skin of right lower limb, including hip: Secondary | ICD-10-CM | POA: Diagnosis not present

## 2023-12-01 DIAGNOSIS — D2271 Melanocytic nevi of right lower limb, including hip: Secondary | ICD-10-CM | POA: Diagnosis not present

## 2023-12-01 DIAGNOSIS — D2272 Melanocytic nevi of left lower limb, including hip: Secondary | ICD-10-CM | POA: Diagnosis not present

## 2023-12-13 ENCOUNTER — Encounter: Payer: Self-pay | Admitting: Cardiovascular Disease

## 2023-12-24 ENCOUNTER — Other Ambulatory Visit: Payer: Self-pay

## 2023-12-24 DIAGNOSIS — R0781 Pleurodynia: Secondary | ICD-10-CM | POA: Diagnosis not present

## 2023-12-24 DIAGNOSIS — W108XXA Fall (on) (from) other stairs and steps, initial encounter: Secondary | ICD-10-CM | POA: Diagnosis not present

## 2023-12-24 DIAGNOSIS — Y92009 Unspecified place in unspecified non-institutional (private) residence as the place of occurrence of the external cause: Secondary | ICD-10-CM | POA: Diagnosis not present

## 2023-12-24 DIAGNOSIS — S299XXA Unspecified injury of thorax, initial encounter: Secondary | ICD-10-CM | POA: Diagnosis not present

## 2023-12-24 MED ORDER — HYDROCODONE-ACETAMINOPHEN 5-325 MG PO TABS
1.0000 | ORAL_TABLET | Freq: Every evening | ORAL | 0 refills | Status: DC
  Filled 2023-12-24: qty 15, 5d supply, fill #0

## 2023-12-24 MED ORDER — CYCLOBENZAPRINE HCL 5 MG PO TABS
5.0000 mg | ORAL_TABLET | Freq: Three times a day (TID) | ORAL | 0 refills | Status: DC
  Filled 2023-12-24: qty 21, 7d supply, fill #0

## 2024-01-16 ENCOUNTER — Other Ambulatory Visit: Payer: Self-pay

## 2024-01-16 MED FILL — Sertraline HCl Tab 50 MG: ORAL | 90 days supply | Qty: 90 | Fill #1 | Status: AC

## 2024-01-16 MED FILL — Clopidogrel Bisulfate Tab 75 MG (Base Equiv): ORAL | 90 days supply | Qty: 90 | Fill #1 | Status: AC

## 2024-01-16 MED FILL — Atorvastatin Calcium Tab 80 MG (Base Equivalent): ORAL | 90 days supply | Qty: 90 | Fill #1 | Status: AC

## 2024-01-17 ENCOUNTER — Other Ambulatory Visit: Payer: Self-pay

## 2024-01-17 DIAGNOSIS — J029 Acute pharyngitis, unspecified: Secondary | ICD-10-CM | POA: Diagnosis not present

## 2024-01-17 DIAGNOSIS — Z03818 Encounter for observation for suspected exposure to other biological agents ruled out: Secondary | ICD-10-CM | POA: Diagnosis not present

## 2024-01-17 MED ORDER — AZITHROMYCIN 500 MG PO TABS
500.0000 mg | ORAL_TABLET | Freq: Every day | ORAL | 0 refills | Status: DC
  Filled 2024-01-17: qty 5, 5d supply, fill #0

## 2024-01-17 NOTE — Progress Notes (Signed)
 Chief Complaint:   Chief Complaint  Patient presents with  . Sore Throat    Started yesterday after church. Denies fever, chills body ache. Reports 3/10 pain. 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 male established patient in today for:  HPI patient presents to clinic with complaint of sore throat. Onset of sxs yesterday. Very painful to swallow. Denies fever, chills, myalgias, fatigue, URI sxs, cough, dyspnea, chest pain, nausea, vomiting, diarrhea, headache, dizziness, or loss of consciousness. 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 Reports having strep previously and feels similar.     Past Medical History:  Diagnosis Date  . Aortic atherosclerosis ()    CT scan 10/30/2020  . Depression   . ED (erectile dysfunction)   . Glaucoma (increased eye pressure)    PDG OD, PDS OS  . HTN (hypertension)   . Hyperlipidemia LDL goal < 70   . PE (pulmonary embolism) (CMS/HHS-HCC)   . Suicide gesture (CMS/HHS-HCC)   . Type 2 diabetes mellitus with diabetic retinopathy (CMS/HHS-HCC)    started insulin  pump 12/2018  . Type 2 diabetes mellitus with peripheral neuropathy (CMS/HHS-HCC)     Past Surgical History:  Procedure Laterality Date  . heart stent N/A 07/02/2015  . GLAUCOMA EYE SURGERY Bilateral    SLT    Outpatient Medications Prior to Visit  Medication Sig Dispense Refill  . atorvastatin  (LIPITOR) 80 MG tablet Take 1 tablet (80 mg total) by mouth once daily 90 tablet 3  . ezetimibe  (ZETIA ) 10 mg tablet Take 1 tablet (10 mg total) by mouth once daily 90 tablet 3  . gabapentin  (NEURONTIN ) 300 MG capsule TAKE 1 TO 2 CAPSULES BY MOUTH TWICE DAILY FOR FOOT PAIN 360 capsule 3  . gabapentin  (NEURONTIN ) 600 MG tablet Take 600 mg by mouth 3 (three) times daily    . HYDROcodone -acetaminophen  (NORCO) 5-325 mg tablet Take one tablet at night for pain; may take up to every 8 hours as needed  for pain if not working or driving 15 tablet 0  . insulin  LISPRO (ADMELOG , HUMALOG ) injection (concentration 100 units/mL) Take up to 150 units daily via insulin  pump 140 mL 0  . insulin  needles, disposable, 31 X 5/16  needle Inject subcutaneously 4 (four) times daily before meals and nightly  3  . lisinopriL  (ZESTRIL ) 5 MG tablet Take 1 tablet (5 mg total) by mouth once daily 90 tablet 3  . metoprolol  succinate (TOPROL -XL) 100 MG XL tablet Take 1 tablet (100 mg total) by mouth nightly 90 tablet 3  . nitroGLYcerin  (NITROSTAT ) 0.3 MG SL tablet Place 0.3 mg under the tongue once daily as needed  0  . sertraline  (ZOLOFT ) 50 MG tablet Take 50 mg by mouth once daily  0  . spironolactone  (ALDACTONE ) 25 MG tablet Take 12.5 mg by mouth once daily      . XARELTO  2.5 mg tablet once daily    2  . blood glucose diagnostic test strip Use once daily Use as instructed. Contour next (Patient not taking: Reported on 06/17/2018) 100 each 3  . blood-glucose sensor (DEXCOM G7 SENSOR) Devi Use 1 each every 10 (ten) days (Patient not taking: Reported on 01/17/2024) 9 each 3  . GLUCAGON 1 mg injection  (Patient not taking: Reported on 01/17/2024)  11   No facility-administered medications prior to visit.    Allergies  Allergen Reactions  . Metrizamide Anaphylaxis  .  Other Anaphylaxis    Shrimp  . Others Anaphylaxis    Uncoded Allergy. Allergen: CT contrast dye. Tolerated contrast for heart catheterization 07/2015  . Statins-Hmg-Coa Reductase Inhibitors Anaphylaxis    Uncoded Allergy. Allergen: CT contrast dye. Tolerated contrast for heart catheterization 07/2015  . Sulfamethoxazole-Trimethoprim Anaphylaxis    Uncoded Allergy. Allergen: CT contrast dye. Tolerated contrast for heart catheterization 07/2015  . Sulfasalazine Anaphylaxis    Uncoded Allergy. Allergen: CT contrast dye. Tolerated contrast for heart catheterization 07/2015  . Cephalosporins Itching  . Shrimp Hives    Family History  Problem Relation  Name Age of Onset  . Cataracts Mother    . Glaucoma Mother    . Breast cancer Mother    . Diabetes Father    . Myocardial Infarction (Heart attack) Father    . Glaucoma Maternal Grandfather    . Cataracts Maternal Grandfather    . Diabetes Maternal Grandfather      Social History   Tobacco Use  . Smoking status: Never  . Smokeless tobacco: Never  Vaping Use  . Vaping status: Never Used  Substance Use Topics  . Alcohol use: Never  . Drug use: No      Review of Systems  Pertinent positive and negative ROS as documented in HPI.    Objective:   Blood pressure (!) 144/81, pulse 77, temperature 36.6 C (97.9 F), height 182.9 cm (6'), weight 91.1 kg (200 lb 12.8 oz), SpO2 97%.  Physical Exam Vitals and nursing note reviewed.  Constitutional:      General: He is not in acute distress.    Appearance: Normal appearance. He is not ill-appearing, toxic-appearing or diaphoretic.  HENT:     Head: Normocephalic and atraumatic.     Right Ear: Tympanic membrane and ear canal normal.     Left Ear: Tympanic membrane and ear canal normal.     Nose: Nose normal.     Comments: No TTP of sinuses.    Mouth/Throat:     Mouth: Mucous membranes are moist.     Pharynx: Oropharynx is clear. Posterior oropharyngeal erythema present. No oropharyngeal exudate.     Comments: No trismus. Uvula midline. Managing secretions.  Eyes:     General: No scleral icterus.       Right eye: No discharge.        Left eye: No discharge.     Conjunctiva/sclera: Conjunctivae normal.     Pupils: Pupils are equal, round, and reactive to light.  Cardiovascular:     Rate and Rhythm: Normal rate and regular rhythm.     Heart sounds: Normal heart sounds.  Pulmonary:     Effort: Pulmonary effort is normal. No respiratory distress.     Breath sounds: Normal breath sounds. No wheezing, rhonchi or rales.     Comments: Respiratory exam as documented. Musculoskeletal:     Cervical back: Normal range of motion and  neck supple.  Lymphadenopathy:     Cervical: No cervical adenopathy.  Skin:    General: Skin is warm and dry.  Neurological:     Mental Status: He is alert.  Psychiatric:        Mood and Affect: Mood normal.        Behavior: Behavior normal.     Results for orders placed or performed in visit on 01/17/24  Xpert Dx Strep A, PCR - [facility]   Specimen: Throat  Result Value Ref Range   Xpress Strep A, PCR Not Detected Not Detected, INVALID  Extended  Respiratory Viral Panel - [facility]   Specimen: Nasal Swab; Other  Result Value Ref Range   Influenza A PCR Negative Negative   Influenza B PCR Negative Negative   RSV PCR Negative Negative   SARS-CoV2 PCR Negative Negative   Narrative   Positive  Positive results are indicative of the presence of the identified virus, but do not rule out bacterial infection or co-infection with other pathogens not detected by the test. Clinical correlation with patient history and other diagnostic information is necessary to determine patient infection status.  Negative  Negative results do not preclude SARS-CoV-2 infection, Influenza A virus, Influenza B virus and/or RSV infection and should not be used as the sole basis for treatment or other patient management decisions. Negative results must be combined with clinical observations, patient history, and/or epidemiological information.  Test Comments:  This test has not been FDA cleared or approved. This test has been authorized by FDA under an Emergency Use Authorization (EUA). This test is only authorized for the duration of the declaration that circumstances exist justifying the authorization of emergency use of in vitro diagnostics for detection and/or diagnosis of COVID-19 under Section 564(b) (1) of the Act, 21 U.S.C. 360bbb-3 (b) (1), unless the authorization is terminated or revoked sooner.   CBC w/auto Differential (5 Part)  Result Value Ref Range   WBC (White Blood Cell Count) 6.1 4.1 - 10.2  10^3/uL   RBC (Red Blood Cell Count) 4.44 (L) 4.69 - 6.13 10^6/uL   Hemoglobin 13.2 (L) 14.1 - 18.1 gm/dL   Hematocrit 61.7 (L) 59.9 - 52.0 %   MCV (Mean Corpuscular Volume) 86.0 80.0 - 100.0 fl   MCH (Mean Corpuscular Hemoglobin) 29.7 27.0 - 31.2 pg   MCHC (Mean Corpuscular Hemoglobin Concentration) 34.6 32.0 - 36.0 gm/dL   Platelet Count 770 849 - 450 10^3/uL   RDW-CV (Red Cell Distribution Width) 11.9 11.6 - 14.8 %   MPV (Mean Platelet Volume) 10.7 9.4 - 12.4 fl   Neutrophils 3.27 1.50 - 7.80 10^3/uL   Lymphocytes 1.87 1.00 - 3.60 10^3/uL   Monocytes 0.64 0.00 - 1.50 10^3/uL   Eosinophils 0.20 0.00 - 0.55 10^3/uL   Basophils 0.06 0.00 - 0.09 10^3/uL   Neutrophil % 53.9 32.0 - 70.0 %   Lymphocyte % 30.8 10.0 - 50.0 %   Monocyte % 10.5 4.0 - 13.0 %   Eosinophil % 3.3 1.0 - 5.0 %   Basophil% 1.0 0.0 - 2.0 %   Immature Granulocyte % 0.5 <=0.7 %   Immature Granulocyte Count 0.03 <=0.06 10^3/L     Assessment/Plan:   1. Sore throat, unspecified -     Cancel: Strep A, Rapid Strep -     Xpert Dx Strep A, PCR - [facility] -     Extended Respiratory Viral Panel [facility]; Future -     CBC w/auto Differential (5 Part)  2. Acute pharyngitis, unspecified etiology -     azithromycin  (ZITHROMAX ) 500 MG tablet; Take 1 tablet (500 mg total) by mouth once daily for 5 days  Dispense: 5 tablet; Refill: 0   Patient is pleasant, cooperative, in no apparent distress. BP mildly elevated - ?secondary to pain. Vitals otherwise okay. Lungs clear. No respiratory distress. Does have posterior oropharynx erythema. Managing secretions. Strep negative. Given sxs, will check ERP and CBC.   ERP negative. WBC 6.1. neutrophils 53.9%. Suspect pharyngitis - viral vs bacterial. Will rx abx to cover possible bacterial given lack of other URI sxs. Further symptomatic treatment  discussed. Patient questions addressed. Follow up with PCP if no improvement. ED precautions provided in patient d/c instructions. Stable for  discharge.    Attestation Statement:   I personally performed the service, non-incident to. (WP)   ASHLEY BRIDGETTE POISSON, PA      Patient Instructions  Please take antibiotics as prescribed for treatment. Complete the entire course even if feeling better.  Tylenol  or Ibuprofen as directed on packaging for discomfort.  Warm salt water gargles and OTC throat drops a few times a day may also help.  Follow up with PCP or in clinic if symptoms persist despite treatment.  Go to ED if develop neck stiffness, drooling, unable to open mouth, trouble breathing, chest pain, sudden/severe headache, neurologic symptoms.

## 2024-02-16 ENCOUNTER — Ambulatory Visit: Admitting: Family

## 2024-02-23 ENCOUNTER — Emergency Department

## 2024-02-23 ENCOUNTER — Observation Stay
Admission: EM | Admit: 2024-02-23 | Discharge: 2024-02-24 | Disposition: A | Discharge status: Home / Self Care | Attending: Internal Medicine | Admitting: Internal Medicine

## 2024-02-23 ENCOUNTER — Inpatient Hospital Stay

## 2024-02-23 ENCOUNTER — Encounter: Payer: Self-pay | Admitting: Emergency Medicine

## 2024-02-23 ENCOUNTER — Other Ambulatory Visit: Payer: Self-pay

## 2024-02-23 DIAGNOSIS — R29818 Other symptoms and signs involving the nervous system: Secondary | ICD-10-CM | POA: Diagnosis not present

## 2024-02-23 DIAGNOSIS — Z794 Long term (current) use of insulin: Secondary | ICD-10-CM | POA: Diagnosis not present

## 2024-02-23 DIAGNOSIS — Z7901 Long term (current) use of anticoagulants: Secondary | ICD-10-CM

## 2024-02-23 DIAGNOSIS — I509 Heart failure, unspecified: Secondary | ICD-10-CM | POA: Diagnosis not present

## 2024-02-23 DIAGNOSIS — Z8673 Personal history of transient ischemic attack (TIA), and cerebral infarction without residual deficits: Secondary | ICD-10-CM | POA: Diagnosis not present

## 2024-02-23 DIAGNOSIS — Z86711 Personal history of pulmonary embolism: Secondary | ICD-10-CM

## 2024-02-23 DIAGNOSIS — I110 Hypertensive heart disease with heart failure: Secondary | ICD-10-CM | POA: Diagnosis not present

## 2024-02-23 DIAGNOSIS — Z79899 Other long term (current) drug therapy: Secondary | ICD-10-CM | POA: Diagnosis not present

## 2024-02-23 DIAGNOSIS — R739 Hyperglycemia, unspecified: Secondary | ICD-10-CM | POA: Diagnosis not present

## 2024-02-23 DIAGNOSIS — I5042 Chronic combined systolic (congestive) and diastolic (congestive) heart failure: Secondary | ICD-10-CM | POA: Diagnosis present

## 2024-02-23 DIAGNOSIS — R531 Weakness: Secondary | ICD-10-CM | POA: Diagnosis present

## 2024-02-23 DIAGNOSIS — F319 Bipolar disorder, unspecified: Secondary | ICD-10-CM | POA: Diagnosis present

## 2024-02-23 DIAGNOSIS — R4781 Slurred speech: Secondary | ICD-10-CM | POA: Diagnosis not present

## 2024-02-23 DIAGNOSIS — I10 Essential (primary) hypertension: Secondary | ICD-10-CM | POA: Diagnosis present

## 2024-02-23 DIAGNOSIS — I639 Cerebral infarction, unspecified: Secondary | ICD-10-CM

## 2024-02-23 DIAGNOSIS — G459 Transient cerebral ischemic attack, unspecified: Principal | ICD-10-CM | POA: Diagnosis present

## 2024-02-23 DIAGNOSIS — R262 Difficulty in walking, not elsewhere classified: Secondary | ICD-10-CM | POA: Diagnosis not present

## 2024-02-23 DIAGNOSIS — E1165 Type 2 diabetes mellitus with hyperglycemia: Secondary | ICD-10-CM | POA: Diagnosis not present

## 2024-02-23 LAB — URINALYSIS, ROUTINE W REFLEX MICROSCOPIC
Bacteria, UA: NONE SEEN
Bilirubin Urine: NEGATIVE
Glucose, UA: >=500 mg/dL — AB
Ketones, ur: NEGATIVE mg/dL
Leukocytes,Ua: NEGATIVE
Nitrite: NEGATIVE
Protein, ur: 100 mg/dL — AB
Specific Gravity, Urine: 1.022 (ref 1.005–1.030)
Squamous Epithelial / HPF: 0 /HPF (ref 0–5)
pH: 7 (ref 5.0–8.0)

## 2024-02-23 LAB — COMPREHENSIVE METABOLIC PANEL WITH GFR
ALT: 13 U/L (ref 0–44)
AST: 13 U/L — ABNORMAL LOW (ref 15–41)
Albumin: 3.9 g/dL (ref 3.5–5.0)
Alkaline Phosphatase: 129 U/L — ABNORMAL HIGH (ref 38–126)
Anion gap: 12 (ref 5–15)
BUN: 30 mg/dL — ABNORMAL HIGH (ref 6–20)
CO2: 26 mmol/L (ref 22–32)
Calcium: 9.6 mg/dL (ref 8.9–10.3)
Chloride: 91 mmol/L — ABNORMAL LOW (ref 98–111)
Creatinine, Ser: 1.23 mg/dL (ref 0.61–1.24)
GFR, Estimated: >60 mL/min (ref >60)
Glucose, Bld: 573 mg/dL (ref 70–99)
Potassium: 4.7 mmol/L (ref 3.5–5.1)
Sodium: 129 mmol/L — ABNORMAL LOW (ref 135–145)
Total Bilirubin: 0.8 mg/dL (ref 0.0–1.2)
Total Protein: 7.6 g/dL (ref 6.5–8.1)

## 2024-02-23 LAB — PROTIME-INR
INR: 1.1 (ref 0.8–1.2)
Prothrombin Time: 14.3 s (ref 11.4–15.2)

## 2024-02-23 LAB — BLOOD GAS, VENOUS
Acid-Base Excess: 4.8 mmol/L — ABNORMAL HIGH (ref 0.0–2.0)
Bicarbonate: 29.9 mmol/L — ABNORMAL HIGH (ref 20.0–28.0)
O2 Saturation: 87.5 %
Patient temperature: 37
pCO2, Ven: 45 mmHg (ref 44–60)
pH, Ven: 7.43 (ref 7.25–7.43)
pO2, Ven: 53 mmHg — ABNORMAL HIGH (ref 32–45)

## 2024-02-23 LAB — APTT: aPTT: 23 s — ABNORMAL LOW (ref 24–36)

## 2024-02-23 LAB — DIFFERENTIAL
Abs Immature Granulocytes: 0.04 K/uL (ref 0.00–0.07)
Basophils Absolute: 0 K/uL (ref 0.0–0.1)
Basophils Relative: 1 %
Eosinophils Absolute: 0.1 K/uL (ref 0.0–0.5)
Eosinophils Relative: 2 %
Immature Granulocytes: 1 %
Lymphocytes Relative: 29 %
Lymphs Abs: 2 K/uL (ref 0.7–4.0)
Monocytes Absolute: 0.7 K/uL (ref 0.1–1.0)
Monocytes Relative: 11 %
Neutro Abs: 4 K/uL (ref 1.7–7.7)
Neutrophils Relative %: 56 %

## 2024-02-23 LAB — CBG MONITORING, ED
Glucose-Capillary: 516 mg/dL (ref 70–99)
Glucose-Capillary: 518 mg/dL (ref 70–99)
Glucose-Capillary: 495 mg/dL — ABNORMAL HIGH (ref 70–99)

## 2024-02-23 LAB — ETHANOL: Alcohol, Ethyl (B): <15 mg/dL (ref <15)

## 2024-02-23 LAB — CBC
HCT: 39.7 % (ref 39.0–52.0)
Hemoglobin: 13.8 g/dL (ref 13.0–17.0)
MCH: 29.9 pg (ref 26.0–34.0)
MCHC: 34.8 g/dL (ref 30.0–36.0)
MCV: 85.9 fL (ref 80.0–100.0)
Platelets: 239 K/uL (ref 150–400)
RBC: 4.62 MIL/uL (ref 4.22–5.81)
RDW: 12.1 % (ref 11.5–15.5)
WBC: 6.9 K/uL (ref 4.0–10.5)
nRBC: 0 % (ref 0.0–0.2)

## 2024-02-23 LAB — TROPONIN I (HIGH SENSITIVITY): Troponin I (High Sensitivity): 4 ng/L (ref <18)

## 2024-02-23 MED ORDER — ACETAMINOPHEN 650 MG RE SUPP: 650.0000 mg | RECTAL | Status: DC | PRN

## 2024-02-23 MED ORDER — CLOPIDOGREL BISULFATE 75 MG PO TABS
75.0000 mg | ORAL_TABLET | Freq: Every day | ORAL | Status: DC
  Administered 2024-02-24: 75 mg via ORAL
  Filled 2024-02-23: qty 1

## 2024-02-23 MED ORDER — DEXTROSE IN LACTATED RINGERS 5 % IV SOLN: INTRAVENOUS | Status: DC

## 2024-02-23 MED ORDER — DEXTROSE 50 % IV SOLN: 0.0000 mL | INTRAVENOUS | Status: DC | PRN

## 2024-02-23 MED ORDER — SODIUM CHLORIDE 0.9 % IV SOLN: INTRAVENOUS | Status: DC

## 2024-02-23 MED ORDER — LORAZEPAM 2 MG/ML IJ SOLN
1.0000 mg | Freq: Once | INTRAMUSCULAR | Status: AC | PRN
  Administered 2024-02-23: 1 mg via INTRAVENOUS
  Filled 2024-02-23: qty 1

## 2024-02-23 MED ORDER — ACETAMINOPHEN 160 MG/5ML PO SOLN: 650.0000 mg | ORAL | Status: DC | PRN

## 2024-02-23 MED ORDER — NITROGLYCERIN 0.4 MG SL SUBL: 0.4000 mg | SUBLINGUAL_TABLET | SUBLINGUAL | Status: DC | PRN

## 2024-02-23 MED ORDER — INSULIN REGULAR(HUMAN) IN NACL 100-0.9 UT/100ML-% IV SOLN
INTRAVENOUS | Status: DC
  Administered 2024-02-23: 15 [IU]/h via INTRAVENOUS
  Filled 2024-02-23: qty 100

## 2024-02-23 MED ORDER — ATORVASTATIN CALCIUM 20 MG PO TABS
80.0000 mg | ORAL_TABLET | Freq: Every day | ORAL | Status: DC
  Administered 2024-02-24: 80 mg via ORAL
  Filled 2024-02-23: qty 4

## 2024-02-23 MED ORDER — SERTRALINE HCL 50 MG PO TABS
50.0000 mg | ORAL_TABLET | Freq: Every day | ORAL | Status: DC
Start: 2024-02-23 — End: 2024-02-24
  Administered 2024-02-24: 50 mg via ORAL
  Filled 2024-02-23: qty 1

## 2024-02-23 MED ORDER — RIVAROXABAN 2.5 MG PO TABS
2.5000 mg | ORAL_TABLET | Freq: Two times a day (BID) | ORAL | Status: DC
  Administered 2024-02-24 (×2): 2.5 mg via ORAL
  Filled 2024-02-23 (×2): qty 1

## 2024-02-23 MED ORDER — ACETAMINOPHEN 325 MG PO TABS: 650.0000 mg | ORAL_TABLET | ORAL | Status: DC | PRN

## 2024-02-23 MED ORDER — LACTATED RINGERS IV SOLN: INTRAVENOUS | Status: DC

## 2024-02-23 MED ORDER — GABAPENTIN 600 MG PO TABS
1800.0000 mg | ORAL_TABLET | Freq: Every day | ORAL | Status: DC
Start: 2024-02-23 — End: 2024-02-24
  Administered 2024-02-24: 1800 mg via ORAL
  Filled 2024-02-23: qty 6

## 2024-02-23 MED ORDER — LACTATED RINGERS IV BOLUS
1000.0000 mL | Freq: Once | INTRAVENOUS | Status: AC
Start: 2024-02-23 — End: 2024-02-23
  Administered 2024-02-23: 1000 mL via INTRAVENOUS

## 2024-02-23 MED ORDER — STROKE: EARLY STAGES OF RECOVERY BOOK: Freq: Once | Status: DC

## 2024-02-23 NOTE — ED Triage Notes (Signed)
 CODE STROKE  Pt arrived via [facility] from home where he had sudden onset at 2030 tonight of blurred vision, slurred speech, and inability to move left leg when standing. Initial EMS report was left arm drift, slurred speech, blurred vision, unequal grip strength (less in left hand,) and left leg weakness. Pt arrived to ED with left sided facial droop and left sided grip strength weaker than right. No slurred speech, drift or weakness noted on arrival. Pt denies blurred vision, HA and dizziness.    Hx/o DM and CHF  EMS CBG: 576  *Off DM medication x6 months

## 2024-02-23 NOTE — Assessment & Plan Note (Signed)
 Clinically euvolemic Will continue GDMT but will hold BP lowering medications to allow for permissive hypertension (Entresto , spironolactone , metoprolol , furosemide )

## 2024-02-23 NOTE — Consult Note (Signed)
 [facility] Consult Services  Audio Consult Due to a technical failure, video communication was unavailable during this encounter. The consultation was conducted via audio-only (telephone) communication in coordination with on-site clinical staff. Neurologic assessment was limited to findings relayed by the bedside team. I provided guidance based on real-time discussion with the staff and clinical judgment under the constraints of the technology limitations.  Patient Name:   Jose Merritt, Jose Merritt Date of Birth:   01-Feb-1976 Identification Number:   MRN - [id number]  Differential Diagnosis (based on reported history and exam):       I63.00 - Cerebrovascular accident (CVA) due to thrombosis of precerebral artery (HCCC)  Assessment and Plan: Patient with history of PE on xarelto , HTN, HLD, diabetes presented after acute onset of left sided weakness and numbness. On arrival most of symptoms have resolved and he has slight numbness of left arm only. Per ER physician's exam NIHSS 1. His glucose was 500. Would admit for glycemic control and stroke workup. MRI brain imaging and MRA head/neck. He has a contrast allergy to contrast hence no CTA was obtained. Symptoms are rapidly improving and mild to warrant thrombolytic therapy.  HPI (Provided by ED Team): Patient with history of PE on xarelto , HTN, HLD, diabetes presented after acute onset of left sided weakness and numbness. On arrival most of symptoms have resolved and he has slight numbness of left arm only. Exam findings (Reported by the ED team): NIHSS 1 Test/Imaging results (Reported by the ED team or reviewed in EMR): CT head unremarkable Consider thrombolytic if no contraindications identified by ED: No CTA / Advanced imaging recommended: No   Dr Lenox Mends   [facility] For Inpatient follow-up with [facility] physician please call [facility] at [phone]. As we are not an outpatient service for any  post hospital discharge needs please contact the hospital for assistance.  If you have any questions for the [facility] physicians or need to reconsult for clinical or diagnostic changes please contact us  via [facility] at [phone].   Signature : Lenox Shalie Schremp

## 2024-02-23 NOTE — ED Notes (Signed)
 Code  Stroke called in field by EMS

## 2024-02-23 NOTE — Hospital Course (Signed)
 SABRA

## 2024-02-23 NOTE — Assessment & Plan Note (Signed)
 Will continue insulin  infusion per Endo tool in the setting of acute stroke to better manage blood sugars Will need counseling on the importance of compliance with med IV fluids to assist with blood sugar control

## 2024-02-23 NOTE — Assessment & Plan Note (Signed)
 Permissive hypertension for first 24-48 hrs post stroke onset: Prn Labetalol IV or Vasotec IV If BP greater than 220/120  Statins for LDL goal less than 70 ASA 81mg  daily, Plavix  75mg  daily x 3 weeks then monotherapy thereafter Telemetry, echo and MRI Avoid dextrose containing fluids, Maintain euglycemia, euthermia Neuro checks q4 hrs x 24 hrs and then per shift Head of bed 30 degrees Physical therapy/Occupational therapy/Speech therapy if failed dysphagia screen Neurology consult to follow

## 2024-02-23 NOTE — Assessment & Plan Note (Signed)
 Continue sertraline

## 2024-02-23 NOTE — H&P (Signed)
 History and Physical    Patient: Jose Merritt FMW:[id number] DOB: [birth year] DOA: 02/23/2024 DOS: the patient was seen and examined on 02/23/2024 PCP: Dineen Rollene MATSU, FNP  Patient coming from: Home  Chief Complaint:  Chief Complaint  Patient presents with   Code Stroke    HPI: Jose Merritt is a 48 y.o. male with medical history significant for CAD, prior STEMI with LAD stent, DM, HTN, bipolar, TIA times 07/21/2016, ischemic cardiomyopathy with improved EF, history of PE on Xarelto , prior LV apical thrombus in 2017  being admitted for suspected acute CVA in the setting of hyperglycemia of 573 after presenting from the field as a code stroke. Patient developed sudden onset left-sided arm and leg weakness and numbness and slurred speech starting a 8:15 PM on 02/23/2024.  He arrived to the ED by 9:20 PM by which time symptoms had significantly improved with NIHSS of 1.  He endorsed noncompliance with his insulin  for several months. Vitals on arrival in the ED within normal limits Stat head CT per code stroke protocol negative and nonacute, aspects 10. He was seen in consultation by teleneurology who recommended admission for stroke workup and glycemic control.  Advise getting MRI brain and MRA head and neck.  No indication for thrombolytic therapy due to her rapidly improving symptoms. Additional ED workup: Labs notable for glucose 573 with normal anion gap, normal bicarb normal potassium sodium 129 but otherwise unremarkable EKG with sinus rhythm at 83 and old anterior infarct  Patient treated with LR bolus started on an insulin  infusion. Admission requested     Review of Systems: As mentioned in the history of present illness. All other systems reviewed and are negative.  Past Medical History:  Diagnosis Date   Asthma    Bipolar disorder (HCC)    CHF (congestive heart failure) (HCC)    Chicken pox    Clotting disorder    lung   Depression    Diabetes mellitus  without complication (HCC) 2003   Type 2   Heart murmur    as child   Hyperlipidemia    Hypertension    Myocardial infarction (HCC) 06/2015   Pulmonary embolism (HCC) 2018   Retinopathy of both eyes 01/2016   SOB (shortness of breath) 08/17/2016   Stroke [facility])    Past Surgical History:  Procedure Laterality Date   CORONARY ANGIOPLASTY WITH STENT PLACEMENT Left 06/2015   LADx1 stent   WISDOM TOOTH EXTRACTION     Social History:  reports that he has never smoked. He has never used smokeless tobacco. He reports that he does not drink alcohol and does not use drugs.  Allergies  Allergen Reactions   Metrizamide Anaphylaxis   Other Anaphylaxis    Shrimp Shrimp Shrimp   Statins Anaphylaxis    Uncoded Allergy. Allergen: CT contrast dye. Tolerated contrast for heart catheterization 07/2015   Sulfa Antibiotics Anaphylaxis    Uncoded Allergy. Allergen: CT contrast dye. Tolerated contrast for heart catheterization 07/2015   Sulfamethoxazole-Trimethoprim Anaphylaxis    Uncoded Allergy. Allergen: CT contrast dye. Tolerated contrast for heart catheterization 07/2015   Sulfasalazine Anaphylaxis    Uncoded Allergy. Allergen: CT contrast dye. Tolerated contrast for heart catheterization 07/2015   Cephalosporins    Contrast Media [Iodinated Contrast Media]    Shrimp (Diagnostic)     Family History  Problem Relation Age of Onset   Hypertension Mother    Arthritis Mother    Heart disease Father        3  heart attacks   Diabetes Father    Hypertension Father    Hyperlipidemia Father    Alcohol abuse Father    Hypertension Brother    Hyperlipidemia Brother    Heart disease Maternal Uncle    Heart disease Paternal Aunt    Hypertension Maternal Grandmother    Kidney disease Maternal Grandmother    Arthritis Maternal Grandmother    Heart disease Maternal Grandfather    Hyperlipidemia Maternal Grandfather    Diabetes Maternal Grandfather    Kidney disease Maternal Grandfather     Heart disease Paternal Grandmother    Kidney disease Paternal Grandmother    Kidney disease Paternal Grandfather    Colon cancer Neg Hx     Prior to Admission medications   Medication Sig Start Date End Date Taking? Authorizing Provider  glucose blood (FREESTYLE LITE) test strip Used in conjunction with insulin  pump to check blood sugars 4 x daily. Patient not taking: Reported on 07/29/2023 12/31/21   Dineen Rollene MATSU, FNP  atorvastatin  (LIPITOR) 80 MG tablet Take 1 tablet (80 mg total) by mouth daily. 03/30/23   Dineen Rollene MATSU, FNP  azithromycin  (ZITHROMAX ) 500 MG tablet Take 1 tablet (500 mg total) by mouth once daily for 5 days 01/17/24     Blood Glucose Monitoring Suppl (FREESTYLE FREEDOM LITE) w/Device KIT Used in conjunction with insulin  pump to check blood sugars 4 x daily. Patient not taking: Reported on 07/29/2023 01/01/22   Dineen Rollene MATSU, FNP  Cholecalciferol  1.25 MG (50000 UT) capsule Take 1 capsule by mouth once weekly for 8 weeks. 04/16/23   Dineen Rollene MATSU, FNP  clopidogrel  (PLAVIX ) 75 MG tablet Take 1 tablet (75 mg total) by mouth daily. 03/30/23   Dineen Rollene MATSU, FNP  cyclobenzaprine  (FLEXERIL ) 5 MG tablet Take 1 tablet (5 mg total) by mouth 3 (three) times daily as needed for muscle spasms for up to 7 days. 12/24/23     ezetimibe  (ZETIA ) 10 MG tablet Take 1 tablet (10 mg total) by mouth once daily 01/29/23   Gollan, Timothy J, MD  furosemide  (LASIX ) 20 MG tablet Take 1 tablet (20 mg total) by mouth daily. 01/29/23   Gollan, Timothy J, MD  gabapentin  (NEURONTIN ) 600 MG tablet Take 3 tablets (1,800 mg total) by mouth at bedtime. 04/22/23   Dineen Rollene MATSU, FNP  HYDROcodone -acetaminophen  (NORCO/VICODIN) 5-325 MG tablet Take 1 tablet by mouth Nightly for pain. May take up to every 8 hours as needed for pain if not working or driving. 2/74/74     insulin  lispro (HUMALOG ) 100 UNIT/ML injection Take up to 150 units daily via insulin  pump 12/17/20     insulin  lispro (HUMALOG )  100 UNIT/ML injection Use up to 150 units daily via insulin  pump.  Please schedule office visit for further refills. 06/30/23     insulin  lispro (HUMALOG ) 100 UNIT/ML injection Inject 1.5 mLs (150 Units total) into the skin daily. 06/30/23   Damian Therisa HERO, MD  Insulin  Pen Needle (UNIFINE PENTIPS) 31G X 5 MM MISC Use as directed with Humalog  3 times a day 12/31/21   Dineen Rollene MATSU, FNP  Lancets (FREESTYLE) lancets Used in conjunction with insulin  pump to check blood sugars 4 x daily. Patient not taking: Reported on 07/29/2023 01/01/22   Dineen Rollene MATSU, FNP  lidocaine  (LIDODERM ) 5 % Place 1 patch onto the skin daily. Remove & Discard patch within 12 hours. 12/29/21   Dineen Rollene MATSU, FNP  metoprolol  succinate (TOPROL -XL) 100 MG 24 hr tablet Take 1  tablet (100 mg total) by mouth nightly. 01/29/23   Gollan, Timothy J, MD  nitroGLYCERIN  (NITROSTAT ) 0.3 MG SL tablet Place 1 tablet (0.3 mg total) under the tongue every 5 (five) minutes as needed. 06/17/23   Gollan, Timothy J, MD  ondansetron  (ZOFRAN -ODT) 4 MG disintegrating tablet Take 1 tablet (4 mg total) by mouth every 8 (eight) hours as needed for nausea or vomiting. 06/29/23   Dineen Rollene MATSU, FNP  rivaroxaban  (XARELTO ) 2.5 MG TABS tablet Take 1 tablet (2.5 mg total) by mouth 2 (two) times daily. 01/29/23 04/20/24  Gollan, Timothy J, MD  sacubitril -valsartan  (ENTRESTO ) 24-26 MG Take 1 tablet by mouth 2 (two) times daily. 01/29/23   Gollan, Timothy J, MD  scopolamine  (TRANSDERM-SCOP) 1 MG/3DAYS Place 1 patch (1.5 mg total) onto the skin every 3 (three) days. 06/29/23   Dineen Rollene MATSU, FNP  sertraline  (ZOLOFT ) 50 MG tablet Take 1 tablet (50 mg total) by mouth at bedtime. 03/29/23   Dineen Rollene MATSU, FNP  spironolactone  (ALDACTONE ) 25 MG tablet Take 0.5 tablets (12.5 mg total) by mouth every morning. 01/29/23 04/20/24  Perla Evalene PARAS, MD    Physical Exam: Vitals:   02/23/24 2200 02/23/24 2201 02/23/24 2245 02/23/24 2300  BP: (!) 145/77  (!)  164/79 (!) 154/81  Pulse: 76  82 81  Resp: 15  17 14   Temp:      TempSrc:      SpO2: 97%  98% 99%  Weight:  96.2 kg    Height:       Physical Exam  Labs on Admission: I have personally reviewed following labs and imaging studies  CBC: Recent Labs  Lab 02/23/24 2107  WBC 6.9  NEUTROABS 4.0  HGB 13.8  HCT 39.7  MCV 85.9  PLT 239   Basic Metabolic Panel: Recent Labs  Lab 02/23/24 2107  NA 129*  K 4.7  CL 91*  CO2 26  GLUCOSE 573*  BUN 30*  CREATININE 1.23  CALCIUM  9.6   GFR: Estimated Creatinine Clearance: 86.4 mL/min (by C-G formula based on SCr of 1.23 mg/dL). Liver Function Tests: Recent Labs  Lab 02/23/24 2107  AST 13*  ALT 13  ALKPHOS 129*  BILITOT 0.8  PROT 7.6  ALBUMIN 3.9   No results for input(s): LIPASE, AMYLASE in the last 168 hours. No results for input(s): AMMONIA in the last 168 hours. Coagulation Profile: Recent Labs  Lab 02/23/24 2107  INR 1.1   Cardiac Enzymes: No results for input(s): CKTOTAL, CKMB, CKMBINDEX, TROPONINI in the last 168 hours. BNP (last 3 results) No results for input(s): PROBNP in the last 8760 hours. HbA1C: No results for input(s): HGBA1C in the last 72 hours. CBG: Recent Labs  Lab 02/23/24 2104 02/23/24 2200 02/23/24 2239  GLUCAP 516* 518* 495*   Lipid Profile: No results for input(s): CHOL, HDL, LDLCALC, TRIG, CHOLHDL, LDLDIRECT in the last 72 hours. Thyroid  Function Tests: No results for input(s): TSH, T4TOTAL, FREET4, T3FREE, THYROIDAB in the last 72 hours. Anemia Panel: No results for input(s): VITAMINB12, FOLATE, FERRITIN, TIBC, IRON, RETICCTPCT in the last 72 hours. Urine analysis:    Component Value Date/Time   COLORURINE STRAW (A) 02/23/2024 2224   APPEARANCEUR CLEAR (A) 02/23/2024 2224   LABSPEC 1.022 02/23/2024 2224   PHURINE 7.0 02/23/2024 2224   GLUCOSEU >=500 (A) 02/23/2024 2224   GLUCOSEU >=1000 (A) 10/30/2020 1431   HGBUR SMALL (A)  02/23/2024 2224   BILIRUBINUR NEGATIVE 02/23/2024 2224   BILIRUBINUR negative 05/21/2020 1128   KETONESUR  NEGATIVE 02/23/2024 2224   PROTEINUR 100 (A) 02/23/2024 2224   UROBILINOGEN 0.2 10/30/2020 1431   NITRITE NEGATIVE 02/23/2024 2224   LEUKOCYTESUR NEGATIVE 02/23/2024 2224    Radiological Exams on Admission: CT HEAD CODE STROKE WO CONTRAST Result Date: 02/23/2024 CLINICAL DATA:  Code stroke. Initial evaluation for acute neuro deficit, stroke suspected. EXAM: CT HEAD WITHOUT CONTRAST TECHNIQUE: Contiguous axial images were obtained from the base of the skull through the vertex without intravenous contrast. RADIATION DOSE REDUCTION: This exam was performed according to the departmental dose-optimization program which includes automated exposure control, adjustment of the mA and/or kV according to patient size and/or use of iterative reconstruction technique. COMPARISON:  CT from 04/30/2017. FINDINGS: Brain: Cerebral volume within normal limits. No acute intracranial hemorrhage. No acute large vessel territory infarct. No mass lesion, midline shift or mass effect. No hydrocephalus or extra-axial fluid collection. Vascular: No abnormal hyperdense vessel. Calcified atherosclerosis present at the skull base. Skull: Scalp soft tissues within normal limits.  Calvarium intact. Sinuses/Orbits: Globes and orbital soft tissues within normal limits. Paranasal sinuses and mastoid air cells are largely clear. Other: None. ASPECTS [facility] Stroke Program Early CT Score) - Ganglionic level infarction (caudate, lentiform nuclei, internal capsule, insula, M1-M3 cortex): 7 - Supraganglionic infarction (M4-M6 cortex): 3 Total score (0-10 with 10 being normal): 10 IMPRESSION: 1. Negative head CT.  No acute intracranial abnormality. 2. ASPECTS is 10. Results were called by telephone at the time of interpretation on 02/23/2024 at 9:10 p.m. to provider [facility] , who verbally acknowledged these results.  Electronically Signed   By: Morene Hoard M.D.   On: 02/23/2024 21:13   Data Reviewed for HPI: Relevant notes from primary care and specialist visits, past discharge summaries as available in EHR, including Care Everywhere. Prior diagnostic testing as pertinent to current admission diagnoses Updated medications and problem lists for reconciliation ED course, including vitals, labs, imaging, treatment and response to treatment Triage notes, nursing and pharmacy notes and ED provider's notes Notable results as noted above in HPI      Assessment and Plan: * Acute CVA (cerebrovascular accident) (HCC) Permissive hypertension for first 24-48 hrs post stroke onset: Prn Labetalol IV or Vasotec IV If BP greater than 220/120  Statins for LDL goal less than 70 ASA 81mg  daily, Plavix  75mg  daily x 3 weeks then monotherapy thereafter Telemetry, echo and MRI Avoid dextrose  containing fluids, Maintain euglycemia, euthermia Neuro checks q4 hrs x 24 hrs and then per shift Head of bed 30 degrees Physical therapy/Occupational therapy/Speech therapy if failed dysphagia screen Neurology consult to follow   Uncontrolled type 2 diabetes mellitus with hyperglycemia, with long-term current use of insulin  (HCC) Will continue insulin  infusion per Endo tool in the setting of acute stroke to better manage blood sugars Will need counseling on the importance of compliance with med IV fluids to assist with blood sugar control  History of pulmonary embolism Chronic anticoagulation Continue Xarelto   Chronic combined systolic and diastolic CHF (congestive heart failure) (HCC) Clinically euvolemic Will continue GDMT but will hold BP lowering medications to allow for permissive hypertension (Entresto , spironolactone , metoprolol , furosemide )  Hypertension Holding antihypertensives allow for permissive hypertension  Bipolar mood disorder (HCC) Continue sertraline     DVT prophylaxis:  Xarelto   Consults: none  Advance Care Planning:   Code Status: Prior   Family Communication: none  Disposition Plan: Back to previous home environment  Severity of Illness: The appropriate patient status for this patient is INPATIENT. Inpatient status is judged to be reasonable  and necessary in order to provide the required intensity of service to ensure the patient's safety. The patient's presenting symptoms, physical exam findings, and initial radiographic and laboratory data in the context of their chronic comorbidities is felt to place them at high risk for further clinical deterioration. Furthermore, it is not anticipated that the patient will be medically stable for discharge from the hospital within 2 midnights of admission.   * I certify that at the point of admission it is my clinical judgment that the patient will require inpatient hospital care spanning beyond 2 midnights from the point of admission due to high intensity of service, high risk for further deterioration and high frequency of surveillance required.*  Author: Delayne LULLA Solian, MD 02/23/2024 11:30 PM  For on call review [email or web address].

## 2024-02-23 NOTE — Assessment & Plan Note (Signed)
 Holding antihypertensives allow for permissive hypertension

## 2024-02-23 NOTE — Assessment & Plan Note (Signed)
Chronic anticoagulation Continue Xarelto

## 2024-02-23 NOTE — ED Provider Notes (Signed)
 [facility] Provider Note    Event Date/Time   First MD Initiated Contact with Patient 02/23/24 2105     (approximate)   History   Code Stroke   HPI  Jose Merritt is a 48 y.o. male with a history of type 2 diabetes, hypertension, hyperlipidemia, and PE who presents with left-sided weakness and numbness, acute onset around 8:30 PM.  He initially noticed blurred vision and then difficulty speaking.  He was unable to move his left arm or leg while standing.  When EMS arrived he continued of slurred speech and left arm and leg weakness.  By the time he arrived in the ED the left leg weakness had improved.  Currently, the patient's speech is back to normal.  He denies any numbness or weakness in the leg.  He still has mild weakness in the left arm but denies any significant numbness.  He states that he has had a TIA previously.  He also reports that he has been off of his insulin  for approximately 6 months.  He states that he was on a pump but then had trouble getting replenishment of some of the parts and gave up on it.  I reviewed the past medical records.  The patient's most recent outpatient encounter was with family medicine on 8/18 for a sore throat.  He has no recent ED visits or hospitalizations.   Physical Exam   Triage Vital Signs: ED Triage Vitals [02/23/24 2106]  Encounter Vitals Group     BP      Girls Systolic BP Percentile      Girls Diastolic BP Percentile      Boys Systolic BP Percentile      Boys Diastolic BP Percentile      Pulse      Resp      Temp      Temp src      SpO2      Weight      Height 6' (1.829 m)     Head Circumference      Peak Flow      Pain Score 0     Pain Loc      Pain Education      Exclude from Growth Chart     Most recent vital signs: Vitals:   02/23/24 2245 02/23/24 2300  BP: (!) 164/79 (!) 154/81  Pulse: 82 81  Resp: 17 14  Temp:    SpO2: 98% 99%    General: Alert and oriented, comfortable  appearing, no distress.  CV:  Good peripheral perfusion.  Resp:  Normal effort.  Abd:  No distention.  Other:  EOMI.  PERRLA.  No photophobia.  No facial droop.  Normal speech.  5/5 motor strength and intact sensation to all extremities except for LUE with very mildly decreased grip strength.  Very mild LUE ataxia.  No sensory deficits.  No pronator drift.   ED Results / Procedures / Treatments   Labs (all labs ordered are listed, but only abnormal results are displayed) Labs Reviewed  APTT - Abnormal; Notable for the following components:      Result Value   aPTT 23 (*)    All other components within normal limits  COMPREHENSIVE METABOLIC PANEL WITH GFR - Abnormal; Notable for the following components:   Sodium 129 (*)    Chloride 91 (*)    Glucose, Bld 573 (*)    BUN 30 (*)    AST 13 (*)  Alkaline Phosphatase 129 (*)    All other components within normal limits  URINALYSIS, ROUTINE W REFLEX MICROSCOPIC - Abnormal; Notable for the following components:   Color, Urine STRAW (*)    APPearance CLEAR (*)    Glucose, UA >=500 (*)    Hgb urine dipstick SMALL (*)    Protein, ur 100 (*)    All other components within normal limits  BLOOD GAS, VENOUS - Abnormal; Notable for the following components:   pO2, Ven 53 (*)    Bicarbonate 29.9 (*)    Acid-Base Excess 4.8 (*)    All other components within normal limits  CBG MONITORING, ED - Abnormal; Notable for the following components:   Glucose-Capillary 516 (*)    All other components within normal limits  CBG MONITORING, ED - Abnormal; Notable for the following components:   Glucose-Capillary 518 (*)    All other components within normal limits  CBG MONITORING, ED - Abnormal; Notable for the following components:   Glucose-Capillary 495 (*)    All other components within normal limits  PROTIME-INR  CBC  DIFFERENTIAL  ETHANOL  TROPONIN I (HIGH SENSITIVITY)  TROPONIN I (HIGH SENSITIVITY)     EKG  ED ECG REPORT I,  Waylon Cassis, the attending physician, personally viewed and interpreted this ECG.  Date: 02/23/2024 EKG Time: 2113 Rate: 83 Rhythm: normal sinus rhythm QRS Axis: normal Intervals: normal ST/T Wave abnormalities: normal Narrative Interpretation: no evidence of acute ischemia    RADIOLOGY  CT head: I independently viewed and interpreted the images; there is no ICH.  Radiology report indicates no acute abnormalities.  MR brain: Pending  PROCEDURES:  Critical Care performed: Yes, see critical care procedure note(s)  .Critical Care  Performed by: Cassis Waylon, MD Authorized by: Cassis Waylon, MD   Critical care provider statement:    Critical care time (minutes):  30   Critical care time was exclusive of:  Separately billable procedures and treating other patients   Critical care was necessary to treat or prevent imminent or life-threatening deterioration of the following conditions:  CNS failure or compromise and endocrine crisis   Critical care was time spent personally by me on the following activities:  Development of treatment plan with patient or surrogate, discussions with consultants, evaluation of patient's response to treatment, examination of patient, ordering and review of laboratory studies, ordering and review of radiographic studies, ordering and performing treatments and interventions, pulse oximetry, re-evaluation of patient's condition, review of old charts and obtaining history from patient or surrogate   Care discussed with: admitting provider      MEDICATIONS ORDERED IN ED: Medications  insulin  regular, human (MYXREDLIN ) 100 units/ 100 mL infusion (11 Units/hr Intravenous Rate/Dose Change 02/23/24 2316)  lactated ringers  infusion ( Intravenous New Bag/Given 02/23/24 2222)  dextrose  5 % in lactated ringers  infusion (0 mLs Intravenous Hold 02/23/24 2225)  dextrose  50 % solution 0-50 mL (has no administration in time range)  lactated ringers   bolus 1,000 mL (1,000 mLs Intravenous New Bag/Given 02/23/24 2128)  LORazepam  (ATIVAN ) injection 1 mg (1 mg Intravenous Given 02/23/24 2311)     IMPRESSION / MDM / ASSESSMENT AND PLAN / ED COURSE  I reviewed the triage vital signs and the nursing notes.  48 year old male with PMH as noted above presents with acute onset of left-sided weakness and numbness as well as slurred speech.  This started at approximate 30 and by the time he was in the ED 8-hour almost completely resolved although he still has  some residual left hand weakness.  Differential diagnosis includes, but is not limited to, TIA, CVA, complex migraine.  The differential for the hyperglycemia includes uncomplicated hyperglycemia, HHS, DKA.  Patient's presentation is most consistent with acute presentation with potential threat to life or bodily function.  The patient is on the cardiac monitor to evaluate for evidence of arrhythmia and/or significant heart rate changes.  Code stroke was activated by EMS.  On arrival to the ED the patient was taken to CT.  CT is negative for acute findings.  There was some technical difficulty that prevented the teleneurologist from evaluating the patient.  I consulted and discussed the case with the teleneurologist over the phone.  NIH stroke scale is 1.  There is no indication for TNK at this time.  We will obtain lab workup, MRI, treat the hyperglycemia, and reassess.   ----------------------------------------- 11:22 PM on 02/23/2024 -----------------------------------------  The decreased grip today and has returned to normal.  The patient is asymptomatic at this time.  He is on an insulin  infusion.  MRI is pending.  He will benefit from admission for further management.  I consulted Dr. Cleatus from the hospitalist service; based on our discussion she agrees to evaluate the patient for admission.   FINAL CLINICAL IMPRESSION(S) / ED DIAGNOSES   Final diagnoses:  TIA (transient ischemic  attack)  Hyperglycemia     Rx / DC Orders   ED Discharge Orders     None        Note:  This document was prepared using Dragon voice recognition software and may include unintentional dictation errors.    Jacolyn Pae, MD 02/23/24 [phone]

## 2024-02-24 ENCOUNTER — Other Ambulatory Visit (HOSPITAL_COMMUNITY): Payer: Self-pay

## 2024-02-24 ENCOUNTER — Inpatient Hospital Stay (HOSPITAL_COMMUNITY)
Admit: 2024-02-24 | Discharge: 2024-02-24 | Disposition: A | Discharge status: Home / Self Care | Attending: Internal Medicine | Admitting: Internal Medicine

## 2024-02-24 ENCOUNTER — Other Ambulatory Visit: Payer: Self-pay

## 2024-02-24 DIAGNOSIS — R739 Hyperglycemia, unspecified: Secondary | ICD-10-CM

## 2024-02-24 DIAGNOSIS — Z794 Long term (current) use of insulin: Secondary | ICD-10-CM

## 2024-02-24 DIAGNOSIS — E1165 Type 2 diabetes mellitus with hyperglycemia: Secondary | ICD-10-CM | POA: Diagnosis not present

## 2024-02-24 DIAGNOSIS — R29818 Other symptoms and signs involving the nervous system: Secondary | ICD-10-CM | POA: Diagnosis not present

## 2024-02-24 DIAGNOSIS — G459 Transient cerebral ischemic attack, unspecified: Secondary | ICD-10-CM

## 2024-02-24 LAB — ECHOCARDIOGRAM COMPLETE
AR max vel: 2.39 cm2
AV Area VTI: 2.35 cm2
AV Area mean vel: 2.34 cm2
AV Mean grad: 4 mmHg
AV Peak grad: 6.6 mmHg
Ao pk vel: 1.28 m/s
Area-P 1/2: 3.72 cm2
Calc EF: 47.8 %
Height: 72 in
MV VTI: 3.14 cm2
S' Lateral: 3.7 cm
Single Plane A2C EF: 41.4 %
Single Plane A4C EF: 51 %
Weight: 3393.32 [oz_av]

## 2024-02-24 LAB — CBG MONITORING, ED
Glucose-Capillary: 132 mg/dL — ABNORMAL HIGH (ref 70–99)
Glucose-Capillary: 150 mg/dL — ABNORMAL HIGH (ref 70–99)
Glucose-Capillary: 178 mg/dL — ABNORMAL HIGH (ref 70–99)
Glucose-Capillary: 181 mg/dL — ABNORMAL HIGH (ref 70–99)
Glucose-Capillary: 184 mg/dL — ABNORMAL HIGH (ref 70–99)
Glucose-Capillary: 188 mg/dL — ABNORMAL HIGH (ref 70–99)
Glucose-Capillary: 219 mg/dL — ABNORMAL HIGH (ref 70–99)
Glucose-Capillary: 334 mg/dL — ABNORMAL HIGH (ref 70–99)
Glucose-Capillary: 407 mg/dL — ABNORMAL HIGH (ref 70–99)

## 2024-02-24 LAB — LIPID PANEL
Cholesterol: 184 mg/dL (ref 0–200)
HDL: 31 mg/dL — ABNORMAL LOW (ref >40)
LDL Cholesterol: 111 mg/dL — ABNORMAL HIGH (ref 0–99)
Total CHOL/HDL Ratio: 5.9 ratio
Triglycerides: 208 mg/dL — ABNORMAL HIGH (ref <150)
VLDL: 42 mg/dL — ABNORMAL HIGH (ref 0–40)

## 2024-02-24 LAB — BASIC METABOLIC PANEL WITH GFR
Anion gap: 12 (ref 5–15)
BUN: 25 mg/dL — ABNORMAL HIGH (ref 6–20)
CO2: 27 mmol/L (ref 22–32)
Calcium: 9.4 mg/dL (ref 8.9–10.3)
Chloride: 98 mmol/L (ref 98–111)
Creatinine, Ser: 0.89 mg/dL (ref 0.61–1.24)
GFR, Estimated: >60 mL/min (ref >60)
Glucose, Bld: 147 mg/dL — ABNORMAL HIGH (ref 70–99)
Potassium: 3.8 mmol/L (ref 3.5–5.1)
Sodium: 134 mmol/L — ABNORMAL LOW (ref 135–145)

## 2024-02-24 LAB — HIV ANTIBODY (ROUTINE TESTING W REFLEX): HIV Screen 4th Generation wRfx: NONREACTIVE

## 2024-02-24 LAB — TROPONIN I (HIGH SENSITIVITY): Troponin I (High Sensitivity): 4 ng/L (ref <18)

## 2024-02-24 LAB — HEMOGLOBIN A1C
Hgb A1c MFr Bld: 13.9 % — ABNORMAL HIGH (ref 4.8–5.6)
Mean Plasma Glucose: 352.23 mg/dL

## 2024-02-24 MED ORDER — INSUPEN PEN NEEDLES 32G X 4 MM MISC
0 refills | Status: DC
  Filled 2024-02-24: qty 100, 90d supply, fill #0

## 2024-02-24 MED ORDER — INSULIN ASPART 100 UNIT/ML IJ SOLN
0.0000 [IU] | Freq: Three times a day (TID) | INTRAMUSCULAR | Status: DC
  Administered 2024-02-24: 4 [IU] via SUBCUTANEOUS
  Administered 2024-02-24: 3 [IU] via SUBCUTANEOUS
  Filled 2024-02-24 (×2): qty 1

## 2024-02-24 MED ORDER — DEXCOM G7 SENSOR MISC
0 refills | Status: DC
  Filled 2024-02-24: qty 3, 30d supply, fill #0

## 2024-02-24 MED ORDER — INSULIN GLARGINE 100 UNIT/ML ~~LOC~~ SOLN
25.0000 [IU] | SUBCUTANEOUS | Status: DC
  Administered 2024-02-24: 25 [IU] via SUBCUTANEOUS
  Filled 2024-02-24: qty 0.25

## 2024-02-24 MED ORDER — INSULIN DEGLUDEC 100 UNIT/ML ~~LOC~~ SOPN
35.0000 [IU] | PEN_INJECTOR | Freq: Every day | SUBCUTANEOUS | 0 refills | Status: DC
  Filled 2024-02-24: qty 9, 25d supply, fill #0

## 2024-02-24 MED ORDER — LIVING WELL WITH DIABETES BOOK
Freq: Once | Status: DC
  Filled 2024-02-24: qty 1

## 2024-02-24 MED ORDER — INSULIN ASPART 100 UNIT/ML IJ SOLN: 0.0000 [IU] | Freq: Every day | INTRAMUSCULAR | Status: DC

## 2024-02-24 MED ORDER — PERFLUTREN LIPID MICROSPHERE
1.0000 mL | INTRAVENOUS | Status: AC | PRN
  Administered 2024-02-24: 3 mL via INTRAVENOUS

## 2024-02-24 NOTE — ED Notes (Signed)
 Per Siedeki MD pt does not need repeat troponin

## 2024-02-24 NOTE — ED Notes (Signed)
 Cleatus MD made aware pt BMP has resulted.

## 2024-02-24 NOTE — ED Notes (Signed)
 Pt back to room from MRI. Pt attached to monitor, NIH completed, swallow screen completed. Insulin  gtt restarted.

## 2024-02-24 NOTE — Progress Notes (Signed)
 SLP Cancellation Note  Patient Details Name: Jose Merritt MRN: [id number] DOB: 09-19-1975   Cancelled treatment:       Reason Eval/Treat Not Completed: SLP screened, no needs identified, will sign off. Orders received for cognitive communication evaluation in the setting of code stroke. MRI negative for acute infarct. Pt reports that he is back to his baseline. No SLP services indicated.   [name], MS, CCC-SLP Speech Language Pathologist Rehab Services; [facility] [phone] (ascom)    [name] 02/24/2024, 11:27 AM

## 2024-02-24 NOTE — Progress Notes (Signed)
 PT Cancellation Note  Patient Details Name: Jose Merritt MRN: [id number] DOB: 1976-01-19   Cancelled Treatment:    Reason Eval/Treat Not Completed: PT screened, no needs identified, will sign off. Patient is back to baseline. No skilled needs at this time.    [name] 02/24/2024, 10:50 AM

## 2024-02-24 NOTE — Inpatient Diabetes Management (Addendum)
 Inpatient Diabetes Program Recommendations  AACE/ADA: New Consensus Statement on Inpatient Glycemic Control   Target Ranges:  Prepandial:   less than 140 mg/dL      Peak postprandial:   less than 180 mg/dL (1-2 hours)      Critically ill patients:  140 - 180 mg/dL    Latest Reference Range & Units 02/24/24 00:41 02/24/24 01:44 02/24/24 02:42 02/24/24 03:43 02/24/24 04:42 02/24/24 05:56  Glucose-Capillary 70 - 99 mg/dL 665 (H) 821 (H) 849 (H) 219 (H) 181 (H) 132 (H)    Latest Reference Range & Units 02/23/24 21:07 02/24/24 02:19  CO2 22 - 32 mmol/L 26 27  Glucose 70 - 99 mg/dL 426 (HH) 852 (H)  Anion gap 5 - 15  12 12     Latest Reference Range & Units 04/15/23 11:18 02/23/24 21:07  Hemoglobin A1C 4.8 - 5.6 % 12.4 ! Pend 13.9 (H)  !: Data is abnormal (H): Data is abnormally high  Review of Glycemic Control  Diabetes history: DM2 Outpatient Diabetes medications: None in 6 months; had been on insulin  and insulin  pump in past Current orders for Inpatient glycemic control: Lantus  25 units Q24H, Novolog  0-20 units TID with meals, Novolog  0-5 units QHS  Inpatient Diabetes Program Recommendations:    HbgA1C:  A1C 13.9% on 02/23/24 indicating an average glucose of 353 mg/dl over the past 2-3 months.  Outpatient DM: At time of discharge, please provide Rx for insulin  lispro Kwikpens [phone]), Tresiba  pens (#[id number]), insulin  pen needles (#[id number]), and Dexcom G7 sensors (#[id number]).  NOTE: Patient admitted with acute focal neurological deficit/TIA and hyperglycemia. Per H&P on 02/23/24, patient stopped using insulin  about 6 months ago.  Initial lab glucose 573 mg/dl and hyperglycemia was treated with IV insulin  which has been stopped and SQ insulin  regimen ordered. Patient received Lantus  25 units at 3:46 am today.  In reviewing chart, noted patient use to see Dr. Damian (last seen 08/06/22 and patient was using T-Slim insulin  pump along with Dexcom G7 CGM; A1C was 8.2% at that time. Patient seen M.  Arnett, FNP on 04/15/23 and it was noted that patient was no longer using insulin  pump and patient did not want to follow up with Dr. Solum (asked for endocrinology referral in [place]). Per note, patient wanted to resume insulin  pump on his own. Will plan to talk with patient today.  Addendum 02/24/24@11 :52-Spoke with patient at bedside in ED regarding DM. Patient confirms that he has not taken insulin  or used Dexcom G7 CGM in 6 months or more. Patient had been taking Lantus  40 units daily and Humalog  with meals (based on glucose and carbs) when he was using SQ insulin .  Patient reports that the cost was too expensive for insulin , CGM sensors, and his insulin  pump supplies when he was using the Tandem T-Slim insulin  pump.  Patient reports that he has Pensions consultant through [employer]. He has not checked glucose or took DM meds in about 6 months. Discussed importance of getting DM under control and A1C of 13.9% on 02/23/24. Discussed glucose and A1C goals.  Encouraged patient to follow up with Endocrinologist for DM management.  Discussed insulin  cost at [facility] outpatient pharmacy (insulin  lispro $15 anTresiba  insulin  pens $35 with coupon). Provided patient with sample sensors of Dexcom G7 and patient would like to get Rx for sensors.  Communicated with Dr. Maree and outpatient [facility] pharmacy via chat message.  Patient very appreciative of assistance with making sure he would be able to afford insulins.  Thanks, Earnie Gainer, RN, MSN, CDCES Diabetes Coordinator Inpatient Diabetes Program [phone] (Team Pager from 8am to 5pm)

## 2024-02-24 NOTE — Assessment & Plan Note (Addendum)
 Permissive hypertension for first 24-48 hrs post stroke onset: Prn Labetalol IV or Vasotec IV If BP greater than 220/120  Statins for LDL goal less than 70 Continue home xarelto  and plavix  Telemetry, echo and MRI--->negative for acute stroke Avoid dextrose  containing fluids, Maintain euglycemia, euthermia Neuro checks q4 hrs x 24 hrs and then per shift Head of bed 30 degrees Physical therapy/Occupational therapy/Speech therapy if failed dysphagia screen Consider neurology consult

## 2024-02-24 NOTE — Evaluation (Signed)
 Occupational Therapy Evaluation Patient Details Name: Jose Merritt MRN: [id number] DOB: December 25, 1975 Today's Date: 02/24/2024   History of Present Illness   Jose Merritt is a 48 y.o. male with medical history significant for CAD, prior STEMI with LAD stent, DM, HTN, bipolar, TIA , ischemic cardiomyopathy, history of PE on Xarelto , prior LV apical thrombus in 2017, being admitted for acute focal neurologic deficit in the setting of hyperglycemia of 573 after presenting from the field as a code stroke with left-sided symptoms.     Clinical Impressions Jose Merritt was seen for OT evaluation this date. Prior to hospital admission, pt was independent in all aspects of ADL/IADL, working in security, and endorses x1 fall in the last six months. Pt states he tripped over his dog going down his stairs. Pt lives with his spouse in a 2 story home with 1 STE and a full flight up to the second floor to access his bedroom/bathroom. Currently pt reporting symptoms have resolved. Pt demonstrates baseline independence to perform ADL and mobility tasks and no strength, sensory, coordination, or visual deficits appreciated with assessment. No skilled OT needs identified. Will sign off. Please re-consult if additional OT needs arise during this hospital stay.      If plan is discharge home, recommend the following:         Functional Status Assessment   Patient has not had a recent decline in their functional status     Equipment Recommendations   None recommended by OT     Recommendations for Other Services         Precautions/Restrictions   Precautions Precautions: Fall Recall of Precautions/Restrictions: Intact Precaution/Restrictions Comments: low fall Restrictions Weight Bearing Restrictions Per Provider Order: No     Mobility Bed Mobility Overal bed mobility: Independent                  Transfers Overall transfer level: Independent Equipment  used: None                      Balance Overall balance assessment: No apparent balance deficits (not formally assessed)                                         ADL either performed or assessed with clinical judgement   ADL Overall ADL's : At baseline;Independent                                       General ADL Comments: Indep with amb to/from hall bathroom, toileting, HH at sink, STS t/fs and bed mobility. Pt reports feeling at baseline level for all ADL/IADL management.     Vision Baseline Vision/History: 1 Wears glasses Ability to See in Adequate Light: 1 Impaired Patient Visual Report: No change from baseline Additional Comments: Initial visual changes, difficulty seeing small print like type on a keyboard. Reports this has fully resolved.     Perception         Praxis         Pertinent Vitals/Pain Pain Assessment Pain Assessment: No/denies pain     Extremity/Trunk Assessment Upper Extremity Assessment Upper Extremity Assessment: Overall WFL for tasks assessed   Lower Extremity Assessment Lower Extremity Assessment: Overall WFL for tasks assessed   Cervical / Trunk Assessment Cervical / Trunk  Assessment: Normal   Communication Communication Communication: No apparent difficulties   Cognition Arousal: Alert Behavior During Therapy: WFL for tasks assessed/performed Cognition: No apparent impairments                               Following commands: Intact       Cueing  General Comments   Cueing Techniques: Verbal cues      Exercises     Shoulder Instructions      Home Living Family/patient expects to be discharged to:: Private residence Living Arrangements: Spouse/significant other;Children Available Help at Discharge: Family Type of Home: House Home Access: Stairs to enter Secretary/administrator of Steps: 1   Home Layout: Two level Alternate Level Stairs-Number of Steps:  flight Alternate Level Stairs-Rails: Right;Left Bathroom Shower/Tub: Producer, television/film/video: Standard     Home Equipment: None          Prior Functioning/Environment Prior Level of Function : Independent/Modified Independent             Mobility Comments: 1 fall at home after tripping on dog on his stairs. broke 2 ribs. 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    OT Problem List: Impaired balance (sitting and/or standing);Decreased strength;Decreased safety awareness   OT Treatment/Interventions:        OT Goals(Current goals can be found in the care plan section)   Acute Rehab OT Goals Patient Stated Goal: to go home OT Goal Formulation: All assessment and education complete, DC therapy Time For Goal Achievement: 02/24/24 Potential to Achieve Goals: Good   OT Frequency:       Co-evaluation              AM-PAC OT 6 Clicks Daily Activity     Outcome Measure Help from another person eating meals?: None Help from another person taking care of personal grooming?: None Help from another person toileting, which includes using toliet, bedpan, or urinal?: None Help from another person bathing (including washing, rinsing, drying)?: None Help from another person to put on and taking off regular upper body clothing?: None Help from another person to put on and taking off regular lower body clothing?: None 6 Click Score: 24   End of Session Nurse Communication: Mobility status  Activity Tolerance: Patient tolerated treatment well Patient left: in bed (seated EOB with visitor present)  OT Visit Diagnosis: Other symptoms and signs involving the nervous system (M70.101)                Time: 9145-9088 OT Time Calculation (min): 17 min Charges:  OT General Charges $OT Visit: 1 Visit OT Evaluation $OT Eval Low Complexity: 1 Low  Jose Merritt, M.S., OTR/L 02/24/24, 11:17 AM

## 2024-02-25 ENCOUNTER — Other Ambulatory Visit: Payer: Self-pay

## 2024-02-25 NOTE — Discharge Summary (Signed)
 Physician Discharge Summary   Patient: Jose Merritt MRN: [id number] DOB: 08/13/1975  Admit date:     02/23/2024  Discharge date: 02/24/2024  Discharge Physician: Cresencio Fairly   PCP: Dineen Rollene MATSU, FNP   Recommendations at discharge:    F/up with outpt providers as requested  Discharge Diagnoses: Principal Problem:   Acute focal neurological deficit/TIA Active Problems:   Acute CVA (cerebrovascular accident) [facility])   Uncontrolled type 2 diabetes mellitus with hyperglycemia, with long-term current use of insulin  (HCC)   History of pulmonary embolism   Bipolar mood disorder (HCC)   Hypertension   Chronic combined systolic and diastolic CHF (congestive heart failure) (HCC)   TIA (transient ischemic attack)   Chronic anticoagulation   Hyperglycemia  Hospital Course:   48 y.o. male with medical history significant for CAD, prior STEMI with LAD stent, DM, HTN, bipolar, TIA ,, ischemic cardiomyopathy , history of PE on Xarelto , prior LV apical thrombus in 2017  being admitted for acute focal neurologic deficit in the setting of hyperglycemia of 573 after presenting from the field as a code stroke with left-sided symptoms.Patient developed sudden onset left-sided arm and leg weakness and numbness and slurred speech starting a 8:15 PM on 02/23/2024.  He arrived to the ED by 9:20 PM by which time symptoms had significantly improved with NIHSS of 1.  He endorsed noncompliance with his insulin  for several months.  States he takes his Xarelto  once a day though it was prescribed twice a day.  Previously had an insulin  pump and Dexcom but had problems with affordability. Vitals on arrival in the ED within normal limits  Stat head CT per code stroke protocol negative and nonacute, He was seen in consultation by teleneurology who recommended admission for stroke workup and glycemic control.  No indication for thrombolytic therapy due to  rapidly improving symptoms. Additional ED workup: Labs  notable for glucose 573 with normal anion gap, normal bicarb normal potassium sodium 129 but otherwise unremarkable. EKG with sinus rhythm at 83 and old anterior infarct  Assessment and Plan: * Acute focal neurological deficit/TIA - symptoms resolved Possibly due to uncontrolled sugars CT head neg MRI/MRA - No acute intracranial abnormality. Negative intracranial MRA. No large vessel occlusion or hemodynamically significant stenosis. Continue home xarelto  and plavix  echo is unchanged from before. Outpt cardio/Dr Gollan f/up. No cardiac symptoms for now.  Uncontrolled type 2 diabetes mellitus with hyperglycemia, with long-term current use of insulin  [facility]) DM nurse seen and educated I've requested him to see Dr Damian post DC - I've also d/w Dr Damian who will set up an appt for him if he doesn't make it. Tresiba  35 units daily (based on 0.35 units x 96 kg) at DC   History of pulmonary embolism Chronic anticoagulation Continue Xarelto   Chronic combined systolic and diastolic CHF (congestive heart failure) (HCC) Clinically euvolemic Continue home meds. Outpt cardio/Dr Gollan f/up.   Hypertension controlled  Bipolar mood disorder (HCC) Continue sertraline          Disposition: Home Diet recommendation:  Discharge Diet Orders (From admission, onward)     Start     Ordered   02/24/24 0000  Diet - low sodium heart healthy        02/24/24 1111           Carb modified diet DISCHARGE MEDICATION: Allergies as of 02/24/2024       Reactions   Metrizamide Anaphylaxis   Other Anaphylaxis   Shrimp Shrimp Shrimp   Statins Anaphylaxis  Uncoded Allergy. Allergen: CT contrast dye. Tolerated contrast for heart catheterization 07/2015   Sulfa Antibiotics Anaphylaxis   Uncoded Allergy. Allergen: CT contrast dye. Tolerated contrast for heart catheterization 07/2015   Sulfamethoxazole-trimethoprim Anaphylaxis   Uncoded Allergy. Allergen: CT contrast dye. Tolerated contrast for  heart catheterization 07/2015   Sulfasalazine Anaphylaxis   Uncoded Allergy. Allergen: CT contrast dye. Tolerated contrast for heart catheterization 07/2015   Cephalosporins    Contrast Media [iodinated Contrast Media]    Shrimp (diagnostic)         Medication List     STOP taking these medications    azithromycin  500 MG tablet Commonly known as: ZITHROMAX    FreeStyle Freedom Lite w/Device Kit   freestyle lancets   FREESTYLE LITE test strip Generic drug: glucose blood   scopolamine  1 MG/3DAYS Commonly known as: TRANSDERM-SCOP       TAKE these medications    atorvastatin  80 MG tablet Commonly known as: LIPITOR Take 1 tablet (80 mg total) by mouth daily.   clopidogrel  75 MG tablet Commonly known as: PLAVIX  Take 1 tablet (75 mg total) by mouth daily.   cyclobenzaprine  5 MG tablet Commonly known as: FLEXERIL  Take 1 tablet (5 mg total) by mouth 3 (three) times daily as needed for muscle spasms for up to 7 days.   Entresto  24-26 MG Generic drug: sacubitril -valsartan  Take 1 tablet by mouth 2 (two) times daily.   ezetimibe  10 MG tablet Commonly known as: ZETIA  Take 1 tablet (10 mg total) by mouth once daily   furosemide  20 MG tablet Commonly known as: LASIX  Take 1 tablet (20 mg total) by mouth daily.   gabapentin  600 MG tablet Commonly known as: NEURONTIN  Take 3 tablets (1,800 mg total) by mouth at bedtime.   HumaLOG  100 UNIT/ML injection Generic drug: insulin  lispro Take up to 150 units daily via insulin  pump   insulin  lispro 100 UNIT/ML injection Commonly known as: HUMALOG  Use up to 150 units daily via insulin  pump.  Please schedule office visit for further refills.   insulin  lispro 100 UNIT/ML injection Commonly known as: HUMALOG  Inject 1.5 mLs (150 Units total) into the skin daily.   HYDROcodone -acetaminophen  5-325 MG tablet Commonly known as: NORCO/VICODIN Take 1 tablet by mouth Nightly for pain. May take up to every 8 hours as needed for pain  if not working or driving.   lidocaine  5 % Commonly known as: LIDODERM  Place 1 patch onto the skin daily. Remove & Discard patch within 12 hours.   metoprolol  succinate 100 MG 24 hr tablet Commonly known as: TOPROL -XL Take 1 tablet (100 mg total) by mouth nightly.   nitroGLYCERIN  0.3 MG SL tablet Commonly known as: NITROSTAT  Place 1 tablet (0.3 mg total) under the tongue every 5 (five) minutes as needed.   ondansetron  4 MG disintegrating tablet Commonly known as: ZOFRAN -ODT Take 1 tablet (4 mg total) by mouth every 8 (eight) hours as needed for nausea or vomiting.   rivaroxaban  2.5 MG Tabs tablet Commonly known as: XARELTO  Take 1 tablet (2.5 mg total) by mouth 2 (two) times daily.   sertraline  50 MG tablet Commonly known as: ZOLOFT  Take 1 tablet (50 mg total) by mouth at bedtime.   spironolactone  25 MG tablet Commonly known as: ALDACTONE  Take 0.5 tablets (12.5 mg total) by mouth every morning.   Vitamin D3 1.25 MG (50000 UT) Caps Take 1 capsule by mouth once weekly for 8 weeks.       ASK your doctor about these medications    Dexcom  G7 Sensor Misc Use as directed every 10 days   TechLite Pen Needles 31G X 5 MM Misc Generic drug: Insulin  Pen Needle Use as directed with Humalog  3 times a day Ask about: Which instructions should I use?   Insupen Pen Needles 32G X 4 MM Misc Generic drug: Insulin  Pen Needle use with Tresiba  Ask about: Which instructions should I use?   Tresiba  FlexTouch 100 UNIT/ML FlexTouch Pen Generic drug: insulin  degludec Inject 35 Units into the skin daily.        Follow-up Information     Arnett, Rollene MATSU, FNP. Schedule an appointment as soon as possible for a visit in 3 day(s).   Specialty: Family Medicine Why: [facility] Discharge F/UP Contact information: [address] [place] [address] [phone]         Maree Jannett POUR, MD. Schedule an appointment as soon as possible for a visit in 2 week(s).    Specialty: Neurology Why: [facility] Discharge F/UP Contact information: [address] [facility] [place] [address] [phone]         Damian Therisa HERO, MD. Schedule an appointment as soon as possible for a visit in 1 week(s).   Specialty: Endocrinology Why: [facility] Discharge F/UP Contact information: [address] [facility] [place] [address] [phone]                Discharge Exam: Fredricka Weights   02/23/24 2201  Weight: 96.2 kg   Constitutional:      General: He is not in acute distress. HENT:     Head: Normocephalic and atraumatic.  Cardiovascular:     Rate and Rhythm: Normal rate and regular rhythm.     Heart sounds: Normal heart sounds.  Pulmonary:     Effort: Pulmonary effort is normal.     Breath sounds: Normal breath sounds.  Abdominal:     Palpations: Abdomen is soft.     Tenderness: There is no abdominal tenderness.  Neurological:     Mental Status: Mental status is at baseline.   Condition at discharge: fair  The results of significant diagnostics from this hospitalization (including imaging, microbiology, ancillary and laboratory) are listed below for reference.   Imaging Studies: ECHOCARDIOGRAM COMPLETE Result Date: 02/24/2024    ECHOCARDIOGRAM REPORT   Patient Name:   Geordan MICHAEL [facility] Date of Exam: 02/24/2024 Medical Rec #:  [id number]              Height:       72.0 in Accession #:    [id number]             Weight:       212.1 lb Date of Birth:  [birth year]              BSA:          2.184 m Patient Age:    48 years               BP:           124/73 mmHg Patient Gender: M                      HR:           72 bpm. Exam Location:  [facility] Procedure: 2D Echo, Cardiac Doppler, Color Doppler, Saline Contrast Bubble Study            and Intracardiac Opacification Agent (Both Spectral and Color Flow  Doppler were utilized during procedure). Indications:     Stroke I63.9   History:         Patient has prior history of Echocardiogram examinations, most                  recent 04/22/2021. Previous Myocardial Infarction and CAD; TIA.  Sonographer:     Rosina Dunk Referring Phys:  [id number] DELAYNE LULLA SOLIAN Diagnosing Phys: Evalene Lunger MD  Sonographer Comments: Global longitudinal strain was attempted. IMPRESSIONS  1. Left ventricular ejection fraction, by estimation, is 35 to 40%. The left ventricle has moderately decreased function. The left ventricle demonstrates regional wall motion abnormalities (Severe hypokinesis of the anterior, anteroseptal and apical wall). Left ventricular diastolic parameters are consistent with Grade I diastolic dysfunction (impaired relaxation).  2. Right ventricular systolic function is normal. The right ventricular size is normal.  3. The mitral valve is normal in structure. No evidence of mitral valve regurgitation. No evidence of mitral stenosis.  4. The aortic valve is normal in structure. Aortic valve regurgitation is not visualized. No aortic stenosis is present.  5. The inferior vena cava is normal in size with greater than 50% respiratory variability, suggesting right atrial pressure of 3 mmHg.  6. Agitated saline contrast bubble study was negative, with no evidence of any interatrial shunt. FINDINGS  Left Ventricle: Left ventricular ejection fraction, by estimation, is 35 to 40%. The left ventricle has moderately decreased function. The left ventricle demonstrates regional wall motion abnormalities. Definity  contrast agent was given IV to delineate the left ventricular endocardial borders. Strain was performed and the global longitudinal strain is indeterminate. The left ventricular internal cavity size was normal in size. There is no left ventricular hypertrophy. Left ventricular diastolic parameters are consistent with Grade I diastolic dysfunction (impaired relaxation). Right Ventricle: The right ventricular size is normal. No increase  in right ventricular wall thickness. Right ventricular systolic function is normal. Left Atrium: Left atrial size was normal in size. Right Atrium: Right atrial size was normal in size. Pericardium: There is no evidence of pericardial effusion. Mitral Valve: The mitral valve is normal in structure. No evidence of mitral valve regurgitation. No evidence of mitral valve stenosis. MV peak gradient, 4.2 mmHg. The mean mitral valve gradient is 2.0 mmHg. Tricuspid Valve: The tricuspid valve is normal in structure. Tricuspid valve regurgitation is not demonstrated. No evidence of tricuspid stenosis. Aortic Valve: The aortic valve is normal in structure. Aortic valve regurgitation is not visualized. No aortic stenosis is present. Aortic valve mean gradient measures 4.0 mmHg. Aortic valve peak gradient measures 6.6 mmHg. Aortic valve area, by VTI measures 2.35 cm. Pulmonic Valve: The pulmonic valve was normal in structure. Pulmonic valve regurgitation is not visualized. No evidence of pulmonic stenosis. Aorta: The aortic root is normal in size and structure. Venous: The inferior vena cava is normal in size with greater than 50% respiratory variability, suggesting right atrial pressure of 3 mmHg. IAS/Shunts: No atrial level shunt detected by color flow Doppler. Agitated saline contrast bubble study was negative, with no evidence of any interatrial shunt. There is no evidence of a patent foramen ovale. There is no evidence of an atrial septal defect. Additional Comments: 3D was performed not requiring image post processing on an independent workstation and was indeterminate.  LEFT VENTRICLE PLAX 2D LVIDd:         4.80 cm      Diastology LVIDs:         3.70 cm  LV e' medial:    5.87 cm/s LV PW:         1.00 cm      LV E/e' medial:  12.0 LV IVS:        1.00 cm      LV e' lateral:   8.38 cm/s LVOT diam:     2.40 cm      LV E/e' lateral: 8.4 LV SV:         63 LV SV Index:   29 LVOT Area:     4.52 cm  LV Volumes (MOD) LV vol  d, MOD A2C: 66.2 ml LV vol d, MOD A4C: 145.0 ml LV vol s, MOD A2C: 38.8 ml LV vol s, MOD A4C: 71.1 ml LV SV MOD A2C:     27.4 ml LV SV MOD A4C:     145.0 ml LV SV MOD BP:      50.7 ml RIGHT VENTRICLE TAPSE (M-mode): 1.6 cm LEFT ATRIUM           Index        RIGHT ATRIUM          Index LA diam:      4.10 cm 1.88 cm/m   RA Area:     8.83 cm LA Vol (A2C): 50.0 ml 22.89 ml/m  RA Volume:   17.40 ml 7.97 ml/m LA Vol (A4C): 39.0 ml 17.85 ml/m  AORTIC VALVE                    PULMONIC VALVE AV Area (Vmax):    2.39 cm     PV Vmax:        1.26 m/s AV Area (Vmean):   2.34 cm     PV Vmean:       85.200 cm/s AV Area (VTI):     2.35 cm     PV VTI:         0.248 m AV Vmax:           128.00 cm/s  PV Peak grad:   6.4 mmHg AV Vmean:          89.000 cm/s  PV Mean grad:   3.0 mmHg AV VTI:            0.268 m      RVOT Peak grad: 2 mmHg AV Peak Grad:      6.6 mmHg AV Mean Grad:      4.0 mmHg LVOT Vmax:         67.60 cm/s LVOT Vmean:        46.100 cm/s LVOT VTI:          0.139 m LVOT/AV VTI ratio: 0.52  AORTA Ao Root diam: 3.00 cm Ao Asc diam:  2.90 cm MITRAL VALVE MV Area (PHT): 3.72 cm    SHUNTS MV Area VTI:   3.14 cm    Systemic VTI:  0.14 m MV Peak grad:  4.2 mmHg    Systemic Diam: 2.40 cm MV Mean grad:  2.0 mmHg    Pulmonic VTI:  0.147 m MV Vmax:       1.02 m/s MV Vmean:      64.0 cm/s MV Decel Time: 204 msec MV E velocity: 70.20 cm/s MV A velocity: 85.40 cm/s MV E/A ratio:  0.82 Evalene Lunger MD Electronically signed by Evalene Lunger MD Signature Date/Time: 02/24/2024/2:44:22 PM    Final    MR BRAIN WO CONTRAST Result Date: 02/24/2024 CLINICAL DATA:  Initial evaluation for  acute TIA. EXAM: MRI HEAD WITHOUT CONTRAST MRA HEAD WITHOUT CONTRAST TECHNIQUE: Multiplanar, multi-echo pulse sequences of the brain and surrounding structures were acquired without intravenous contrast. Angiographic images of the Circle of Willis were acquired using MRA technique without intravenous contrast. COMPARISON:  CT from 02/23/2024.  FINDINGS: MRI HEAD FINDINGS Brain: Cerebral volume within normal limits. Few scattered subcentimeter foci of T2/FLAIR signal abnormality seen involving the supratentorial cerebral white matter, nonspecific, but less than is typically seen for patient age. No evidence for acute or subacute infarct. Gray-white matter differentiation maintained. No areas of chronic cortical infarction. No acute or chronic intracranial blood products. No mass lesion, midline shift or mass effect. No hydrocephalus or extra-axial fluid collection. Pituitary gland within normal limits. Vascular: Major intracranial vascular flow voids are maintained. Skull and upper cervical spine: Craniocervical junction within normal limits. Bone marrow signal intensity normal. No scalp soft tissue abnormality. Sinuses/Orbits: Globes and orbital soft tissues within normal limits. Paranasal sinuses are largely clear. No mastoid effusion. Other: None. MRA HEAD FINDINGS Anterior circulation: Examination degraded by motion artifact. Both internal carotid arteries are patent to the termini without stenosis or other abnormality. A1 segments patent bilaterally. Right A1 hypoplastic. Normal anterior communicating complex. Anterior cerebral arteries patent without stenosis. No M1 stenosis or occlusion. No proximal MCA branch occlusion. Distal MCA branches perfused and symmetric. Posterior circulation: Visualized distal V4 segments widely patent. Right PICA patent. Left PICA not seen. Basilar patent without stenosis. Superior cerebral arteries patent bilaterally. Left PCA supplied via the basilar. Fetal type origin right PCA. Both PCAs patent without significant stenosis. Anatomic variants: As above.  No aneurysm. IMPRESSION: MRI HEAD: Normal brain MRI for age. No acute intracranial abnormality identified. MRA HEAD: Negative intracranial MRA. No large vessel occlusion or hemodynamically significant stenosis. Electronically Signed   By: Morene Hoard M.D.    On: 02/24/2024 00:30   MR ANGIO HEAD WO CONTRAST Result Date: 02/24/2024 CLINICAL DATA:  Initial evaluation for acute TIA. EXAM: MRI HEAD WITHOUT CONTRAST MRA HEAD WITHOUT CONTRAST TECHNIQUE: Multiplanar, multi-echo pulse sequences of the brain and surrounding structures were acquired without intravenous contrast. Angiographic images of the Circle of Willis were acquired using MRA technique without intravenous contrast. COMPARISON:  CT from 02/23/2024. FINDINGS: MRI HEAD FINDINGS Brain: Cerebral volume within normal limits. Few scattered subcentimeter foci of T2/FLAIR signal abnormality seen involving the supratentorial cerebral white matter, nonspecific, but less than is typically seen for patient age. No evidence for acute or subacute infarct. Gray-white matter differentiation maintained. No areas of chronic cortical infarction. No acute or chronic intracranial blood products. No mass lesion, midline shift or mass effect. No hydrocephalus or extra-axial fluid collection. Pituitary gland within normal limits. Vascular: Major intracranial vascular flow voids are maintained. Skull and upper cervical spine: Craniocervical junction within normal limits. Bone marrow signal intensity normal. No scalp soft tissue abnormality. Sinuses/Orbits: Globes and orbital soft tissues within normal limits. Paranasal sinuses are largely clear. No mastoid effusion. Other: None. MRA HEAD FINDINGS Anterior circulation: Examination degraded by motion artifact. Both internal carotid arteries are patent to the termini without stenosis or other abnormality. A1 segments patent bilaterally. Right A1 hypoplastic. Normal anterior communicating complex. Anterior cerebral arteries patent without stenosis. No M1 stenosis or occlusion. No proximal MCA branch occlusion. Distal MCA branches perfused and symmetric. Posterior circulation: Visualized distal V4 segments widely patent. Right PICA patent. Left PICA not seen. Basilar patent without  stenosis. Superior cerebral arteries patent bilaterally. Left PCA supplied via the basilar. Fetal type origin right PCA.  Both PCAs patent without significant stenosis. Anatomic variants: As above.  No aneurysm. IMPRESSION: MRI HEAD: Normal brain MRI for age. No acute intracranial abnormality identified. MRA HEAD: Negative intracranial MRA. No large vessel occlusion or hemodynamically significant stenosis. Electronically Signed   By: Morene Hoard M.D.   On: 02/24/2024 00:30   CT HEAD CODE STROKE WO CONTRAST Result Date: 02/23/2024 CLINICAL DATA:  Code stroke. Initial evaluation for acute neuro deficit, stroke suspected. EXAM: CT HEAD WITHOUT CONTRAST TECHNIQUE: Contiguous axial images were obtained from the base of the skull through the vertex without intravenous contrast. RADIATION DOSE REDUCTION: This exam was performed according to the departmental dose-optimization program which includes automated exposure control, adjustment of the mA and/or kV according to patient size and/or use of iterative reconstruction technique. COMPARISON:  CT from 04/30/2017. FINDINGS: Brain: Cerebral volume within normal limits. No acute intracranial hemorrhage. No acute large vessel territory infarct. No mass lesion, midline shift or mass effect. No hydrocephalus or extra-axial fluid collection. Vascular: No abnormal hyperdense vessel. Calcified atherosclerosis present at the skull base. Skull: Scalp soft tissues within normal limits.  Calvarium intact. Sinuses/Orbits: Globes and orbital soft tissues within normal limits. Paranasal sinuses and mastoid air cells are largely clear. Other: None. ASPECTS [facility] Stroke Program Early CT Score) - Ganglionic level infarction (caudate, lentiform nuclei, internal capsule, insula, M1-M3 cortex): 7 - Supraganglionic infarction (M4-M6 cortex): 3 Total score (0-10 with 10 being normal): 10 IMPRESSION: 1. Negative head CT.  No acute intracranial abnormality. 2. ASPECTS is 10. Results  were called by telephone at the time of interpretation on 02/23/2024 at 9:10 p.m. to provider [facility] , who verbally acknowledged these results. Electronically Signed   By: Morene Hoard M.D.   On: 02/23/2024 21:13    Microbiology: Results for orders placed or performed during the hospital encounter of 05/21/20  Culture, group A strep     Status: None   Collection Time: 05/21/20 11:30 AM   Specimen: Throat  Result Value Ref Range Status   Specimen Description THROAT  Final   Special Requests NONE  Final   Culture   Final    NO GROUP A STREP (S.PYOGENES) ISOLATED Performed at [facility], [address], [place], [place] [address]    Report Status 05/24/2020 FINAL  Final  Urine Culture     Status: None   Collection Time: 05/21/20 11:30 AM   Specimen: Urine, Random  Result Value Ref Range Status   Specimen Description URINE, RANDOM  Final   Special Requests NONE  Final   Culture   Final    NO GROWTH Performed at [facility], [address], [place], [place] [address]    Report Status 05/22/2020 FINAL  Final  Novel Coronavirus, NAA ([facility])     Status: None   Collection Time: 05/21/20 11:45 AM   Specimen: Nasopharyngeal(NP) swabs in vial transport medium   Nasopharynge  Result Value Ref Range Status   SARS-CoV-2, NAA Not Detected Not Detected Final    Comment: This nucleic acid amplification test was developed and its performance characteristics determined by [employer]. Nucleic acid amplification tests include RT-PCR and TMA. This test has not been FDA cleared or approved. This test has been authorized by FDA under an Emergency Use Authorization (EUA). This test is only authorized for the duration of time the declaration that circumstances exist justifying the authorization of the emergency use of in vitro diagnostic tests for detection of SARS-CoV-2 virus and/or diagnosis of COVID-19  infection under section 564(b)(1) of the Act, 21  U.S.C. 639aaa-6(a) (1), unless the authorization is terminated or revoked sooner. When diagnostic testing is negative, the possibility of a false negative result should be considered in the context of a patient's recent exposures and the presence of clinical signs and symptoms consistent with COVID-19. An individual without symptoms of COVID-19 and who is not shedding SARS-CoV-2 virus wo uld expect to have a negative (not detected) result in this assay.   SARS-COV-2, NAA 2 DAY TAT     Status: None   Collection Time: 05/21/20 11:45 AM   Nasopharynge  Result Value Ref Range Status   SARS-CoV-2, NAA 2 DAY TAT Performed  Final    Labs: CBC: Recent Labs  Lab 02/23/24 2107  WBC 6.9  NEUTROABS 4.0  HGB 13.8  HCT 39.7  MCV 85.9  PLT 239   Basic Metabolic Panel: Recent Labs  Lab 02/23/24 2107 02/24/24 0219  NA 129* 134*  K 4.7 3.8  CL 91* 98  CO2 26 27  GLUCOSE 573* 147*  BUN 30* 25*  CREATININE 1.23 0.89  CALCIUM  9.6 9.4   Liver Function Tests: Recent Labs  Lab 02/23/24 2107  AST 13*  ALT 13  ALKPHOS 129*  BILITOT 0.8  PROT 7.6  ALBUMIN 3.9   CBG: Recent Labs  Lab 02/24/24 0343 02/24/24 0442 02/24/24 0556 02/24/24 0850 02/24/24 1116  GLUCAP 219* 181* 132* 188* 184*    Discharge time spent: greater than 30 minutes.  Signed: Cresencio Fairly, MD [facility] 02/25/2024

## 2024-02-28 ENCOUNTER — Other Ambulatory Visit: Payer: Self-pay

## 2024-03-01 ENCOUNTER — Encounter: Payer: Self-pay | Admitting: Family

## 2024-03-01 NOTE — Telephone Encounter (Signed)
 Sending to doc of day due to pcp out of office.  Spoke to pt. Pt stated that he had a tia on 02/23/24 and was having lingering effects. Pt stated the hospital referred him to Dr. Orlando office but didn't have any available appts til dec. Pt denied having any shortness of breath or chest pain. Pt stated that just his lip and fingers were tingling, pt also stated that he feels like he has brain go, and unable to fully explain what his mind is feelings. Advised pt that if he develops worsening symptoms to go back to ed. Pt verbalized understanding.   Pt is scheduled for a acute visit tomorrow with charan and shcheduled a follow up with pcp on 10/21

## 2024-03-02 ENCOUNTER — Encounter: Payer: Self-pay | Admitting: Nurse Practitioner

## 2024-03-02 ENCOUNTER — Other Ambulatory Visit: Payer: Self-pay

## 2024-03-02 ENCOUNTER — Ambulatory Visit: Admitting: Nurse Practitioner

## 2024-03-02 VITALS — BP 134/76 | HR 81 | Temp 98.0°F | Ht 72.0 in | Wt 206.8 lb

## 2024-03-02 DIAGNOSIS — E1165 Type 2 diabetes mellitus with hyperglycemia: Secondary | ICD-10-CM

## 2024-03-02 DIAGNOSIS — Z794 Long term (current) use of insulin: Secondary | ICD-10-CM | POA: Diagnosis not present

## 2024-03-02 DIAGNOSIS — Z8673 Personal history of transient ischemic attack (TIA), and cerebral infarction without residual deficits: Secondary | ICD-10-CM | POA: Diagnosis not present

## 2024-03-02 MED ORDER — INSULIN LISPRO (1 UNIT DIAL) 100 UNIT/ML (KWIKPEN)
30.0000 [IU] | PEN_INJECTOR | Freq: Three times a day (TID) | SUBCUTANEOUS | 11 refills | Status: AC
  Filled 2024-03-02: qty 30, 34d supply, fill #0
  Filled 2024-04-16: qty 30, 34d supply, fill #1
  Filled 2024-06-13: qty 30, 34d supply, fill #2

## 2024-03-02 NOTE — Telephone Encounter (Signed)
 Called Rochele @ [facility] she stated that their wait list is long and they are losing 2 providers as well, but she will call him and speak with him and get him scheduled and place pt on cancellation  list, that's the best she could offer.

## 2024-03-02 NOTE — Progress Notes (Unsigned)
 Established Patient Office Visit  Subjective:  Patient ID: Jose Merritt, male    DOB: 06-19-1975  Age: 48 y.o. MRN: [id number]  CC:  Chief Complaint  Patient presents with   Acute Visit    Lingering side effects from TIA Needs referral to [facility] Neurology    Discussed the use of a AI scribe software for clinical note transcription with the patient, who gave verbal consent to proceed.  HPI Jose Merritt is a 48 year old male with a history of TIA who presents with worsening neurological symptoms.  He experienced sudden onset of neurological symptoms on 02/23/24  with blurry vision, left sided weakness and slurred speech went to ED  via EMS.    The CT Head and MR angio wo contrast with no acute changes.  during a recent hospitalization, including difficulty seeing letters, blotchy vision, numbness in the face, left arm, and leg, and weakness in the left leg and arm. Speech difficulties persist, requiring more effort to enunciate. Current symptoms include tingling in lips and fingers, a 'froggy' sensation in the head, and a tunnel-like feeling. 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  He takes Plavix  75 mg, Zetia , and atorvastatin  80 mg, but has been inconsistent with medication adherence. He manages diabetes with insulin  pens and recently started Tresiba . Blood sugar was high during hospitalization but has improved  HPI   Past Medical History:  Diagnosis Date   Asthma    Bipolar disorder (HCC)    CHF (congestive heart failure) (HCC)    Chicken pox    Clotting disorder    lung   Depression    Diabetes mellitus without complication (HCC) 2003   Type 2   Heart murmur    as child   Hyperlipidemia    Hypertension    Myocardial infarction (HCC) 06/2015   Pulmonary embolism (HCC) 2018   Retinopathy of both eyes 01/2016   SOB (shortness of breath) 08/17/2016   Stroke [facility])     Past Surgical History:  Procedure Laterality Date    CORONARY ANGIOPLASTY WITH STENT PLACEMENT Left 06/2015   LADx1 stent   WISDOM TOOTH EXTRACTION      Family History  Problem Relation Age of Onset   Hypertension Mother    Arthritis Mother    Heart disease Father        3 heart attacks   Diabetes Father    Hypertension Father    Hyperlipidemia Father    Alcohol abuse Father    Hypertension Brother    Hyperlipidemia Brother    Heart disease Maternal Uncle    Heart disease Paternal Aunt    Hypertension Maternal Grandmother    Kidney disease Maternal Grandmother    Arthritis Maternal Grandmother    Heart disease Maternal Grandfather    Hyperlipidemia Maternal Grandfather    Diabetes Maternal Grandfather    Kidney disease Maternal Grandfather    Heart disease Paternal Grandmother    Kidney disease Paternal Grandmother    Kidney disease Paternal Grandfather    Colon cancer Neg Hx     Social History   Socioeconomic History   Marital status: Married    Spouse name: Numair Masden   Number of children: 1   Years of education: Not on file   Highest education level: Associate degree: occupational, Scientist, product/process development, or vocational program  Occupational History   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    Comment: child in daycare qam  Tobacco Use   Smoking status: Never   Smokeless tobacco: Never  Vaping Use   Vaping status: Never Used  Substance and Sexual Activity   Alcohol use: No    Comment: rare- 2x/year   Drug use: No   Sexual activity: Yes    Comment: impotence post heart attack  Other Topics Concern   Not on file  Social History Narrative   Wife is CNM at [employer]      [facility] Security- 50 yo daughter      Cosette from [facility]      Social Drivers of Health   Financial Resource Strain: Medium Risk (12/24/2023)   Received from [facility]   Overall Financial Resource Strain (CARDIA)    Difficulty of Paying Living Expenses: Somewhat hard  Food Insecurity: No Food Insecurity (12/24/2023)    Received from [facility]   Hunger Vital Sign    Within the past 12 months, you worried that your food would run out before you got the money to buy more.: Never true    Within the past 12 months, the food you bought just didn't last and you didn't have money to get more.: Never true  Transportation Needs: No Transportation Needs (12/24/2023)   Received from [facility] - Transportation    In the past 12 months, has lack of transportation kept you from medical appointments or from getting medications?: No    Lack of Transportation (Non-Medical): No  Physical Activity: Inactive (05/01/2017)   Exercise Vital Sign    Days of Exercise per Week: 0 days    Minutes of Exercise per Session: 0 min  Stress: No Stress Concern Present (05/01/2017)   [employer] of Occupational Health - Occupational Stress Questionnaire    Feeling of Stress : Not at all  Social Connections: Moderately Integrated (05/01/2017)   Social Connection and Isolation Panel    Frequency of Communication with Friends and Family: More than three times a week    Frequency of Social Gatherings with Friends and Family: Once a week    Attends Religious Services: More than 4 times per year    Active Member of [employer] or Organizations: No    Attends Banker Meetings: Never    Marital Status: Married  Catering manager Violence: Not At Risk (09/20/2017)   Humiliation, Afraid, Rape, and Kick questionnaire    Fear of Current or Ex-Partner: No    Emotionally Abused: No    Physically Abused: No    Sexually Abused: No     Outpatient Medications Prior to Visit  Medication Sig Dispense Refill   atorvastatin  (LIPITOR) 80 MG tablet Take 1 tablet (80 mg total) by mouth daily. 90 tablet 3   Cholecalciferol  1.25 MG (50000 UT) capsule Take 1 capsule by mouth once weekly for 8 weeks. 8 capsule 0   clopidogrel  (PLAVIX ) 75 MG tablet Take 1 tablet (75 mg total) by mouth daily. 90 tablet 3    Continuous Glucose Sensor (DEXCOM G7 SENSOR) MISC Use as directed every 10 days 3 each 0   cyclobenzaprine  (FLEXERIL ) 5 MG tablet Take 1 tablet (5 mg total) by mouth 3 (three) times daily as needed for muscle spasms for up to 7 days. 21 tablet 0   ezetimibe  (ZETIA ) 10 MG tablet Take 1 tablet (10 mg total) by mouth once daily 90 tablet 3   furosemide  (LASIX ) 20 MG tablet Take 1 tablet (20 mg total) by mouth daily. 90  tablet 3   gabapentin  (NEURONTIN ) 600 MG tablet Take 3 tablets (1,800 mg total) by mouth at bedtime. 270 tablet 3   HYDROcodone -acetaminophen  (NORCO/VICODIN) 5-325 MG tablet Take 1 tablet by mouth Nightly for pain. May take up to every 8 hours as needed for pain if not working or driving. 15 tablet 0   insulin  degludec (TRESIBA ) 100 UNIT/ML FlexTouch Pen Inject 35 Units into the skin daily. 15 mL 0   insulin  lispro (HUMALOG ) 100 UNIT/ML injection Take up to 150 units daily via insulin  pump 140 mL 3   insulin  lispro (HUMALOG ) 100 UNIT/ML injection Use up to 150 units daily via insulin  pump.  Please schedule office visit for further refills. 140 mL 0   insulin  lispro (HUMALOG ) 100 UNIT/ML injection Inject 1.5 mLs (150 Units total) into the skin daily. 140 mL 0   Insulin  Pen Needle (INSUPEN PEN NEEDLES) 32G X 4 MM MISC use with Tresiba  100 each 0   Insulin  Pen Needle (UNIFINE PENTIPS) 31G X 5 MM MISC Use as directed with Humalog  3 times a day 100 each 0   lidocaine  (LIDODERM ) 5 % Place 1 patch onto the skin daily. Remove & Discard patch within 12 hours. 30 patch 0   metoprolol  succinate (TOPROL -XL) 100 MG 24 hr tablet Take 1 tablet (100 mg total) by mouth nightly. 90 tablet 3   nitroGLYCERIN  (NITROSTAT ) 0.3 MG SL tablet Place 1 tablet (0.3 mg total) under the tongue every 5 (five) minutes as needed. 25 tablet 3   ondansetron  (ZOFRAN -ODT) 4 MG disintegrating tablet Take 1 tablet (4 mg total) by mouth every 8 (eight) hours as needed for nausea or vomiting. 20 tablet 0   rivaroxaban   (XARELTO ) 2.5 MG TABS tablet Take 1 tablet (2.5 mg total) by mouth 2 (two) times daily. 180 tablet 3   sacubitril -valsartan  (ENTRESTO ) 24-26 MG Take 1 tablet by mouth 2 (two) times daily. 180 tablet 3   sertraline  (ZOLOFT ) 50 MG tablet Take 1 tablet (50 mg total) by mouth at bedtime. 90 tablet 3   spironolactone  (ALDACTONE ) 25 MG tablet Take 0.5 tablets (12.5 mg total) by mouth every morning. 45 tablet 3   No facility-administered medications prior to visit.    Allergies  Allergen Reactions   Metrizamide Anaphylaxis   Other Anaphylaxis    Shrimp Shrimp Shrimp   Statins Anaphylaxis    Uncoded Allergy. Allergen: CT contrast dye. Tolerated contrast for heart catheterization 07/2015   Sulfa Antibiotics Anaphylaxis    Uncoded Allergy. Allergen: CT contrast dye. Tolerated contrast for heart catheterization 07/2015   Sulfamethoxazole-Trimethoprim Anaphylaxis    Uncoded Allergy. Allergen: CT contrast dye. Tolerated contrast for heart catheterization 07/2015   Sulfasalazine Anaphylaxis    Uncoded Allergy. Allergen: CT contrast dye. Tolerated contrast for heart catheterization 07/2015   Cephalosporins    Contrast Media [Iodinated Contrast Media]    Shrimp (Diagnostic)     ROS Review of Systems Negative unless indicated in HPI.    Objective:    Physical Exam  BP 134/76   Pulse 81   Temp 98 F (36.7 C)   Ht 6' (1.829 m)   Wt 206 lb 12.8 oz (93.8 kg)   SpO2 98%   BMI 28.05 kg/m  Wt Readings from Last 3 Encounters:  03/02/24 206 lb 12.8 oz (93.8 kg)  02/23/24 212 lb 1.3 oz (96.2 kg)  07/29/23 203 lb 9.6 oz (92.4 kg)     Health Maintenance  Topic Date Due   Hepatitis B Vaccines 19-59 Average Risk (  1 of 3 - 19+ 3-dose series) Never done   Colonoscopy  Never done   Pneumococcal Vaccine: 50+ Years (2 of 2 - PCV) 08/05/2018   FOOT EXAM  10/12/2018   OPHTHALMOLOGY EXAM  12/06/2020   Diabetic kidney evaluation - Urine ACR  06/13/2022   Zoster Vaccines- Shingrix (1 of 2) Never  done   COVID-19 Vaccine (3 - 2025-26 season) 03/17/2024 (Originally 01/31/2024)   Influenza Vaccine  08/29/2024 (Originally 12/31/2023)   HEMOGLOBIN A1C  08/22/2024   Diabetic kidney evaluation - eGFR measurement  02/23/2025   DTaP/Tdap/Td (3 - Td or Tdap) 09/07/2030   Hepatitis C Screening  Completed   HIV Screening  Completed   HPV VACCINES  Aged Out   Meningococcal B Vaccine  Aged Out       Topic Date Due   Hepatitis B Vaccines 19-59 Average Risk (1 of 3 - 19+ 3-dose series) Never done    Lab Results  Component Value Date   TSH 1.62 04/15/2023   Lab Results  Component Value Date   WBC 6.9 02/23/2024   HGB 13.8 02/23/2024   HCT 39.7 02/23/2024   MCV 85.9 02/23/2024   PLT 239 02/23/2024   Lab Results  Component Value Date   NA 134 (L) 02/24/2024   K 3.8 02/24/2024   CO2 27 02/24/2024   GLUCOSE 147 (H) 02/24/2024   BUN 25 (H) 02/24/2024   CREATININE 0.89 02/24/2024   BILITOT 0.8 02/23/2024   ALKPHOS 129 (H) 02/23/2024   AST 13 (L) 02/23/2024   ALT 13 02/23/2024   PROT 7.6 02/23/2024   ALBUMIN 3.9 02/23/2024   CALCIUM  9.4 02/24/2024   ANIONGAP 12 02/24/2024   GFR 88.23 04/15/2023   Lab Results  Component Value Date   CHOL 184 02/24/2024   Lab Results  Component Value Date   HDL 31 (L) 02/24/2024   Lab Results  Component Value Date   LDLCALC 111 (H) 02/24/2024   Lab Results  Component Value Date   TRIG 208 (H) 02/24/2024   Lab Results  Component Value Date   CHOLHDL 5.9 02/24/2024   Lab Results  Component Value Date   HGBA1C 13.9 (H) 02/23/2024      Assessment & Plan:  History of TIA (transient ischemic attack)  Other orders -     Insulin  Lispro (1 Unit Dial ); Inject 30 Units into the skin in the morning, at noon, and at bedtime.  Dispense: 15 mL; Refill: 11    Follow-up: No follow-ups on file.   Nickalus Thornsberry, NP

## 2024-03-03 ENCOUNTER — Encounter: Payer: Self-pay | Admitting: Nurse Practitioner

## 2024-03-03 DIAGNOSIS — Z8673 Personal history of transient ischemic attack (TIA), and cerebral infarction without residual deficits: Secondary | ICD-10-CM | POA: Insufficient documentation

## 2024-03-03 NOTE — Assessment & Plan Note (Signed)
 Recent TIA with ongoing residual deficits including speech difficulties, numbness in the lips and fingers.  -Continue Plavix  75 mg, Lipitor 80 mg  and Zetia  10 mg for cardiovascular risk reduction.  -Discussed the importance of controlling risk factors. -Urgent referral sent to [facility]. -Order physical therapy for balance and gait training. -Red flag discussed.  Patient was provided clear instructions to go to ER or urgent care if symptoms does not improve, red flag or new problem develops.  Patient verbalized understanding.

## 2024-03-03 NOTE — Assessment & Plan Note (Signed)
 Type 2 diabetes mellitus with insulin  use. Recent issues with insulin  pump and insurance coverage have led to inconsistent insulin  use. Currently using insulin  pens. Blood sugar was very high during hospital admission but has improved with current insulin  regimen. Lab Results  Component Value Date   HGBA1C 13.9 (H) 02/23/2024  - Continue current insulin  regimen with Tresiba  and Humalog . - Follow up with endocrinology.  - Monitor blood sugar levels regularly.

## 2024-03-13 NOTE — Telephone Encounter (Signed)
 Called pt he stated  Neurology team did reach out to him but the Neurology office told him they couldn't get him in until December 2025 so he didn't even bother booking the appointment. He needed a sooner appointment then that

## 2024-03-14 NOTE — Telephone Encounter (Signed)
 I did reach put to pt he will call the Neurology team back to schedule an appointment and requuest to be put on the cancelation list. Pt does have a follow up appointment with you 03/21/2024 at 10:00AM. He will send us  a Mychart message with a time and date of that scheduled Neurology visit

## 2024-03-21 ENCOUNTER — Ambulatory Visit: Admitting: Family

## 2024-04-11 ENCOUNTER — Ambulatory Visit: Admitting: Family

## 2024-04-16 ENCOUNTER — Other Ambulatory Visit: Payer: Self-pay | Admitting: Cardiovascular Disease

## 2024-04-16 ENCOUNTER — Other Ambulatory Visit: Payer: Self-pay | Admitting: Family

## 2024-04-16 DIAGNOSIS — F3175 Bipolar disorder, in partial remission, most recent episode depressed: Secondary | ICD-10-CM

## 2024-04-16 DIAGNOSIS — I5042 Chronic combined systolic (congestive) and diastolic (congestive) heart failure: Secondary | ICD-10-CM

## 2024-04-16 DIAGNOSIS — I10 Essential (primary) hypertension: Secondary | ICD-10-CM

## 2024-04-16 DIAGNOSIS — Z8673 Personal history of transient ischemic attack (TIA), and cerebral infarction without residual deficits: Secondary | ICD-10-CM

## 2024-04-16 DIAGNOSIS — R0602 Shortness of breath: Secondary | ICD-10-CM

## 2024-04-17 ENCOUNTER — Other Ambulatory Visit: Payer: Self-pay | Admitting: Family

## 2024-04-17 ENCOUNTER — Other Ambulatory Visit: Payer: Self-pay

## 2024-04-17 ENCOUNTER — Other Ambulatory Visit: Payer: Self-pay | Admitting: Cardiovascular Disease

## 2024-04-17 DIAGNOSIS — R0602 Shortness of breath: Secondary | ICD-10-CM

## 2024-04-17 DIAGNOSIS — I5042 Chronic combined systolic (congestive) and diastolic (congestive) heart failure: Secondary | ICD-10-CM

## 2024-04-17 MED ORDER — SERTRALINE HCL 50 MG PO TABS
50.0000 mg | ORAL_TABLET | Freq: Every day | ORAL | 3 refills | Status: AC
  Filled 2024-04-17: qty 90, 90d supply, fill #0

## 2024-04-18 ENCOUNTER — Encounter: Payer: Self-pay | Admitting: Family

## 2024-04-18 ENCOUNTER — Other Ambulatory Visit (HOSPITAL_COMMUNITY): Payer: Self-pay

## 2024-04-18 ENCOUNTER — Other Ambulatory Visit: Payer: Self-pay

## 2024-04-18 ENCOUNTER — Ambulatory Visit: Admitting: Family

## 2024-04-18 VITALS — BP 138/80 | HR 81 | Temp 98.4°F | Ht 73.0 in | Wt 211.4 lb

## 2024-04-18 DIAGNOSIS — Z1211 Encounter for screening for malignant neoplasm of colon: Secondary | ICD-10-CM | POA: Diagnosis not present

## 2024-04-18 DIAGNOSIS — E1140 Type 2 diabetes mellitus with diabetic neuropathy, unspecified: Secondary | ICD-10-CM | POA: Diagnosis not present

## 2024-04-18 DIAGNOSIS — Z794 Long term (current) use of insulin: Secondary | ICD-10-CM

## 2024-04-18 DIAGNOSIS — G459 Transient cerebral ischemic attack, unspecified: Secondary | ICD-10-CM | POA: Diagnosis not present

## 2024-04-18 DIAGNOSIS — E1165 Type 2 diabetes mellitus with hyperglycemia: Secondary | ICD-10-CM | POA: Diagnosis not present

## 2024-04-18 DIAGNOSIS — I10 Essential (primary) hypertension: Secondary | ICD-10-CM

## 2024-04-18 DIAGNOSIS — Z125 Encounter for screening for malignant neoplasm of prostate: Secondary | ICD-10-CM

## 2024-04-18 MED ORDER — GABAPENTIN 600 MG PO TABS
1800.0000 mg | ORAL_TABLET | Freq: Every day | ORAL | 3 refills | Status: AC
  Filled 2024-05-03 – 2024-06-13 (×2): qty 270, 90d supply, fill #0

## 2024-04-18 NOTE — Patient Instructions (Addendum)
 Increase tresiba  to 40 units.  Please ensure you are monitoring  blood sugar and we will review the readings at follow-up.     It is very important to make a follow-up with cardiology, Dr. Gollan.  Please call when you can

## 2024-04-18 NOTE — Assessment & Plan Note (Signed)
 Reviewed emergency room visit.  Counseled on importance of follow-up cholesterol, blood pressure and diabetes.  He declines all of them neurology at this time

## 2024-04-18 NOTE — Progress Notes (Signed)
 Assessment & Plan:  Type 2 diabetes mellitus with diabetic neuropathy, with long-term current use of insulin  (HCC) -     Gabapentin ; Take 3 tablets (1,800 mg total) by mouth at bedtime.  Dispense: 270 tablet; Refill: 3 -     Hemoglobin A1c; Future -     Comprehensive metabolic panel with GFR; Future -     Microalbumin / creatinine urine ratio; Future -     Lipid panel; Future  Screening for colon cancer -     Ambulatory referral to Gastroenterology  Screening for prostate cancer -     PSA; Future  TIA (transient ischemic attack) Assessment & Plan: Reviewed emergency room visit.  Counseled on importance of follow-up cholesterol, blood pressure and diabetes.  He declines all of them neurology at this time   Uncontrolled type 2 diabetes mellitus with hyperglycemia, with long-term current use of insulin  [facility]) Assessment & Plan: Severely uncontrolled.  Increase Lantus  to 40 units daily, continue all Humalog  30 units 3 times daily with meals.  Discussed GLP-1 agonist and SGLT once we have better glycemic control.  Discussed blackbox warning as it relates to GLP-1, side effects and mechanism of action.  Refilled gabapentin  1800 mg nightly for peripheral neuropathy.  Counseled on risk of sedation.  Advised trial decrease of gabapentin  to 1200 mg and trial of topical capsaicin.  He will let me know how he is doing.  pending labs, close f/u.    Hypertension, unspecified type Assessment & Plan: Chronic, stable.  Continue metoprolol  succinate 100mg , spironolactone  12.5mg .  Compliant with Entresto  as managed by Dr. Gollan.      Return precautions given.   Risks, benefits, and alternatives of the medications and treatment plan prescribed today were discussed, and patient expressed understanding.   Education regarding symptom management and diagnosis given to patient on AVS either electronically or printed.  Return in about 1 week (around 04/25/2024).  [name], FNP  Subjective:     Patient ID: Jose Merritt, male    DOB: May 03, 1976, 48 y.o.   MRN: [id number]  CC: Jose Merritt is a 48 y.o. male who presents today for follow up.   HPI: HPI Discussed the use of AI scribe software for clinical note transcription with the patient, who gave verbal consent to proceed.  History of Present Illness   Jose Merritt is a 48 year old male who presents for follow-up for DM.   He is no longer followed with endocrinology  On 02/23/24,  he experienced left-sided weakness and numbness, leading to a visit to the emergency room. He is compliant with Plavix  75 mg daily, Xarelto , Lipitor 80 mg, and Zetia .  He has a history of diabetes with a recent A1c of 13.9% in September. He is not currently following with endocrinology but has resumed using insulin  pens.  He is not using insulin  pump at this time due to insurance .   Endorses dietary indiscretion.    He takes Tresiba  35 units at night and Humalog  30 units three times a day with meals. His fasting blood sugars range from 250 to 300 mg/dL, and postprandial levels are similar if he takes his insulin .   He is on gabapentin  for neuropathy, taking gabapentin  1800- 2400 mg at bedtime, which he adjusts based on leg pain severity.   He reports no low blood sugar episodes and notes that his vision issues are related to his high blood sugar levels.      FBG 250-300. Postprandial 250  Endorses BL blurry vision, increased thirst. Denies vision loss, weight gain, leg swelling, CP, SOB.     Seen in the ED 02/23/2024 for suspected TIA, hyperglycemia Left-sided weakness and numbness.  Blurred vision and difficulty speaking. CT head negative acute findings. 1. Negative head CT. No acute intracranial abnormality.  MRI head and MRA head MRI HEAD:   Normal brain MRI for age. No acute intracranial abnormality identified.   MRA HEAD:   Negative intracranial MRA. No large vessel occlusion or hemodynamically  significant stenosis. A1c 13.9 LDL 111  Denies family history medullary thyroid  cancer, multiple endocrine neoplasia.  Denies personal history of pancreatitis, constipation, thyroid  cancer. Allergies: Metrizamide, Other, Statins, Sulfa antibiotics, Sulfamethoxazole-trimethoprim, Sulfasalazine, Cephalosporins, Contrast media [iodinated contrast media], and Shrimp (diagnostic) Current Outpatient Medications on File Prior to Visit  Medication Sig Dispense Refill   atorvastatin  (LIPITOR) 80 MG tablet Take 1 tablet (80 mg total) by mouth daily. 90 tablet 3   clopidogrel  (PLAVIX ) 75 MG tablet Take 1 tablet (75 mg total) by mouth daily. 90 tablet 3   Continuous Glucose Sensor (DEXCOM G7 SENSOR) MISC Use as directed every 10 days 3 each 0   ezetimibe  (ZETIA ) 10 MG tablet Take 1 tablet (10 mg total) by mouth once daily 90 tablet 3   furosemide  (LASIX ) 20 MG tablet Take 1 tablet (20 mg total) by mouth daily. 90 tablet 3   insulin  degludec (TRESIBA ) 100 UNIT/ML FlexTouch Pen Inject 35 Units into the skin daily. 15 mL 0   insulin  lispro (HUMALOG ) 100 UNIT/ML KwikPen Inject 30 Units into the skin in the morning, at noon, and at bedtime. 15 mL 11   Insulin  Pen Needle (INSUPEN PEN NEEDLES) 32G X 4 MM MISC use with Tresiba  100 each 0   Insulin  Pen Needle (UNIFINE PENTIPS) 31G X 5 MM MISC Use as directed with Humalog  3 times a day 100 each 0   metoprolol  succinate (TOPROL -XL) 100 MG 24 hr tablet Take 1 tablet (100 mg total) by mouth nightly. 90 tablet 3   rivaroxaban  (XARELTO ) 2.5 MG TABS tablet Take 1 tablet (2.5 mg total) by mouth 2 (two) times daily. 180 tablet 3   sacubitril -valsartan  (ENTRESTO ) 24-26 MG Take 1 tablet by mouth 2 (two) times daily. 180 tablet 3   sertraline  (ZOLOFT ) 50 MG tablet Take 1 tablet (50 mg total) by mouth at bedtime. 90 tablet 3   nitroGLYCERIN  (NITROSTAT ) 0.3 MG SL tablet Place 1 tablet (0.3 mg total) under the tongue every 5 (five) minutes as needed. (Patient not taking: Reported  on 04/18/2024) 25 tablet 3   spironolactone  (ALDACTONE ) 25 MG tablet Take 0.5 tablets (12.5 mg total) by mouth every morning. (Patient not taking: Reported on 04/18/2024) 45 tablet 3   No current facility-administered medications on file prior to visit.    Review of Systems  Constitutional:  Negative for chills and fever.  Respiratory:  Negative for cough.   Cardiovascular:  Negative for chest pain and palpitations.  Gastrointestinal:  Negative for nausea and vomiting.  Endocrine: Positive for polydipsia.      Objective:    BP 138/80   Pulse 81   Temp 98.4 F (36.9 C) (Oral)   Ht 6' 1 (1.854 m)   Wt 211 lb 6.4 oz (95.9 kg)   SpO2 99%   BMI 27.89 kg/m  BP Readings from Last 3 Encounters:  04/18/24 138/80  03/02/24 134/76  02/24/24 121/74   Wt Readings from Last 3 Encounters:  04/18/24 211 lb 6.4 oz (95.9 kg)  03/02/24 206 lb 12.8 oz (93.8 kg)  02/23/24 212 lb 1.3 oz (96.2 kg)    Physical Exam Vitals reviewed.  Constitutional:      Appearance: He is well-developed.  Cardiovascular:     Rate and Rhythm: Regular rhythm.     Heart sounds: Normal heart sounds.  Pulmonary:     Effort: Pulmonary effort is normal. No respiratory distress.     Breath sounds: Normal breath sounds. No wheezing, rhonchi or rales.  Skin:    General: Skin is warm and dry.  Neurological:     Mental Status: He is alert.  Psychiatric:        Speech: Speech normal.        Behavior: Behavior normal.

## 2024-04-18 NOTE — Assessment & Plan Note (Signed)
 Chronic, stable.  Continue metoprolol succinate 100mg , spironolactone 12.5mg .  Compliant with Entresto as managed by Dr. Mariah Milling.

## 2024-04-18 NOTE — Assessment & Plan Note (Addendum)
 Severely uncontrolled.  Increase Lantus  to 40 units daily, continue all Humalog  30 units 3 times daily with meals.  Discussed GLP-1 agonist and SGLT once we have better glycemic control.  Discussed blackbox warning as it relates to GLP-1, side effects and mechanism of action.  Refilled gabapentin  1800 mg nightly for peripheral neuropathy.  Counseled on risk of sedation.  Advised trial decrease of gabapentin  to 1200 mg and trial of topical capsaicin.  He will let me know how he is doing.  pending labs, close f/u.

## 2024-04-19 ENCOUNTER — Other Ambulatory Visit

## 2024-04-19 ENCOUNTER — Other Ambulatory Visit: Payer: Self-pay

## 2024-04-19 ENCOUNTER — Telehealth: Payer: Self-pay | Admitting: Family

## 2024-04-19 ENCOUNTER — Encounter: Payer: Self-pay | Admitting: Family

## 2024-04-19 DIAGNOSIS — Z125 Encounter for screening for malignant neoplasm of prostate: Secondary | ICD-10-CM

## 2024-04-19 DIAGNOSIS — Z794 Long term (current) use of insulin: Secondary | ICD-10-CM | POA: Diagnosis not present

## 2024-04-19 DIAGNOSIS — E1140 Type 2 diabetes mellitus with diabetic neuropathy, unspecified: Secondary | ICD-10-CM | POA: Diagnosis not present

## 2024-04-19 LAB — COMPREHENSIVE METABOLIC PANEL WITH GFR
ALT: 15 U/L (ref 0–53)
AST: 10 U/L (ref 0–37)
Albumin: 3.9 g/dL (ref 3.5–5.2)
Alkaline Phosphatase: 138 U/L — ABNORMAL HIGH (ref 39–117)
BUN: 20 mg/dL (ref 6–23)
CO2: 27 meq/L (ref 19–32)
Calcium: 9.3 mg/dL (ref 8.4–10.5)
Chloride: 98 meq/L (ref 96–112)
Creatinine, Ser: 0.92 mg/dL (ref 0.40–1.50)
GFR: 96.83 mL/min (ref >60.00)
Glucose, Bld: 436 mg/dL — ABNORMAL HIGH (ref 70–99)
Potassium: 4.5 meq/L (ref 3.5–5.1)
Sodium: 134 meq/L — ABNORMAL LOW (ref 135–145)
Total Bilirubin: 0.7 mg/dL (ref 0.2–1.2)
Total Protein: 7 g/dL (ref 6.0–8.3)

## 2024-04-19 LAB — MICROALBUMIN / CREATININE URINE RATIO
Creatinine,U: 40.7 mg/dL
Microalb Creat Ratio: 3941.7 mg/g — ABNORMAL HIGH (ref 0.0–30.0)
Microalb, Ur: 160.6 mg/dL — ABNORMAL HIGH (ref 0.0–1.9)

## 2024-04-19 LAB — LIPID PANEL
Cholesterol: 140 mg/dL (ref 0–200)
HDL: 36.8 mg/dL — ABNORMAL LOW (ref >39.00)
LDL Cholesterol: 64 mg/dL (ref 0–99)
NonHDL: 103.32
Total CHOL/HDL Ratio: 4
Triglycerides: 197 mg/dL — ABNORMAL HIGH (ref 0.0–149.0)
VLDL: 39.4 mg/dL (ref 0.0–40.0)

## 2024-04-19 LAB — HEMOGLOBIN A1C: Hgb A1c MFr Bld: 13.3 % — ABNORMAL HIGH (ref 4.6–6.5)

## 2024-04-19 LAB — PSA: PSA: 0.72 ng/mL (ref 0.10–4.00)

## 2024-04-19 MED ORDER — METOPROLOL SUCCINATE ER 100 MG PO TB24
100.0000 mg | ORAL_TABLET | Freq: Every evening | ORAL | 0 refills | Status: AC
  Filled 2024-04-19: qty 30, 30d supply, fill #0

## 2024-04-19 MED ORDER — SACUBITRIL-VALSARTAN 24-26 MG PO TABS
1.0000 | ORAL_TABLET | Freq: Two times a day (BID) | ORAL | 0 refills | Status: AC
  Filled 2024-04-19: qty 60, 30d supply, fill #0

## 2024-04-19 MED ORDER — SPIRONOLACTONE 25 MG PO TABS
12.5000 mg | ORAL_TABLET | Freq: Every morning | ORAL | 0 refills | Status: AC
  Filled 2024-04-19: qty 15, 30d supply, fill #0

## 2024-04-19 MED FILL — Furosemide Tab 20 MG: ORAL | 30 days supply | Qty: 30 | Fill #0 | Status: AC

## 2024-04-19 MED FILL — Ezetimibe Tab 10 MG: ORAL | 30 days supply | Qty: 30 | Fill #0 | Status: AC

## 2024-04-20 ENCOUNTER — Other Ambulatory Visit: Payer: Self-pay | Admitting: Family

## 2024-04-20 ENCOUNTER — Telehealth: Payer: Self-pay | Admitting: Family

## 2024-04-20 ENCOUNTER — Other Ambulatory Visit: Payer: Self-pay

## 2024-04-20 ENCOUNTER — Telehealth: Payer: Self-pay

## 2024-04-20 DIAGNOSIS — Z8673 Personal history of transient ischemic attack (TIA), and cerebral infarction without residual deficits: Secondary | ICD-10-CM

## 2024-04-20 MED FILL — Atorvastatin Calcium Tab 80 MG (Base Equivalent): ORAL | 90 days supply | Qty: 90 | Fill #0 | Status: AC

## 2024-04-20 MED FILL — Clopidogrel Bisulfate Tab 75 MG (Base Equiv): ORAL | 90 days supply | Qty: 90 | Fill #0 | Status: AC

## 2024-04-20 NOTE — Telephone Encounter (Signed)
 See telephone note spoke to pt

## 2024-04-20 NOTE — Telephone Encounter (Signed)
 Pt declines ED or sooner appt with me Appt sch 04/26/24

## 2024-04-20 NOTE — Telephone Encounter (Signed)
 Copied from CRM #[id number]. Topic: General - Other >> Apr 20, 2024 10:27 AM Alfonso HERO wrote: Reason for CRM: patient returning call asking for a callback

## 2024-04-20 NOTE — Telephone Encounter (Signed)
 Spoke with pt he is having dizzy spells feels like vertigo but did Triage pt for confusion, ha, vision changes, weakness of arms or legs, cp, sob.  Not having Any of the symptoms . If he feels like it gets worse he will go to ED declined needing to go will see us  at appt on 11/26

## 2024-04-20 NOTE — Telephone Encounter (Signed)
 Gastroenterology Pre-Procedure Review  Request Date: TBD Requesting Physician: Dr. NELLIE  PATIENT REVIEW QUESTIONS: The patient responded to the following health history questions as indicated:    1. Are you having any GI issues? no 2. Do you have a personal history of Polyps? no 3. Do you have a family history of Colon Cancer or Polyps? no 4. Diabetes Mellitus? yes (takes tresiba  and humalog ) 5. Joint replacements in the past 12 months?no 6. Major health problems in the past 3 months?yes (02/23/24 TIA) 7. Any artificial heart valves, MVP, or defibrillator?no 8. Cardiac history? Heart failure    MEDICATIONS & ALLERGIES:    Patient reports the following regarding taking any anticoagulation/antiplatelet therapy:   Plavix , Coumadin, Eliquis, Xarelto , Lovenox , Pradaxa, Brilinta, or Effient? yes (pt takse Plavix  and Xarelto   Clearance will be sent to [facility]) Aspirin ? no  Patient confirms/reports the following medications:  Current Outpatient Medications  Medication Sig Dispense Refill   atorvastatin  (LIPITOR) 80 MG tablet Take 1 tablet (80 mg total) by mouth daily. 90 tablet 3   clopidogrel  (PLAVIX ) 75 MG tablet Take 1 tablet (75 mg total) by mouth daily. 90 tablet 3   Continuous Glucose Sensor (DEXCOM G7 SENSOR) MISC Use as directed every 10 days 3 each 0   ezetimibe  (ZETIA ) 10 MG tablet Take 1 tablet (10 mg total) by mouth once daily. Pt must schedule an overdue followup appt with Cardiology for anymore refills. [phone]. Thank you 1st attempt 30 tablet 0   furosemide  (LASIX ) 20 MG tablet Take 1 tablet (20 mg total) by mouth daily. 30 tablet 0   gabapentin  (NEURONTIN ) 600 MG tablet Take 3 tablets (1,800 mg total) by mouth at bedtime. 270 tablet 3   insulin  degludec (TRESIBA ) 100 UNIT/ML FlexTouch Pen Inject 35 Units into the skin daily. 15 mL 0   insulin  lispro (HUMALOG ) 100 UNIT/ML KwikPen Inject 30 Units into the skin in the morning, at noon, and at bedtime. 15 mL 11   Insulin   Pen Needle (INSUPEN PEN NEEDLES) 32G X 4 MM MISC use with Tresiba  100 each 0   Insulin  Pen Needle (UNIFINE PENTIPS) 31G X 5 MM MISC Use as directed with Humalog  3 times a day 100 each 0   metoprolol  succinate (TOPROL -XL) 100 MG 24 hr tablet Take 1 tablet (100 mg total) by mouth nightly. 30 tablet 0   nitroGLYCERIN  (NITROSTAT ) 0.3 MG SL tablet Place 1 tablet (0.3 mg total) under the tongue every 5 (five) minutes as needed. (Patient not taking: Reported on 04/18/2024) 25 tablet 3   rivaroxaban  (XARELTO ) 2.5 MG TABS tablet Take 1 tablet (2.5 mg total) by mouth 2 (two) times daily. 180 tablet 3   sacubitril -valsartan  (ENTRESTO ) 24-26 MG Take 1 tablet by mouth 2 (two) times daily. 60 tablet 0   sertraline  (ZOLOFT ) 50 MG tablet Take 1 tablet (50 mg total) by mouth at bedtime. 90 tablet 3   spironolactone  (ALDACTONE ) 25 MG tablet Take 0.5 tablets (12.5 mg total) by mouth every morning. 15 tablet 0   No current facility-administered medications for this visit.    Patient confirms/reports the following allergies:  Allergies  Allergen Reactions   Metrizamide Anaphylaxis   Other Anaphylaxis    Shrimp Shrimp Shrimp   Statins Anaphylaxis    Uncoded Allergy. Allergen: CT contrast dye. Tolerated contrast for heart catheterization 07/2015   Sulfa Antibiotics Anaphylaxis    Uncoded Allergy. Allergen: CT contrast dye. Tolerated contrast for heart catheterization 07/2015   Sulfamethoxazole-Trimethoprim Anaphylaxis    Uncoded Allergy. Allergen:  CT contrast dye. Tolerated contrast for heart catheterization 07/2015   Sulfasalazine Anaphylaxis    Uncoded Allergy. Allergen: CT contrast dye. Tolerated contrast for heart catheterization 07/2015   Cephalosporins    Contrast Media [Iodinated Contrast Media]    Shrimp (Diagnostic)     No orders of the defined types were placed in this encounter.   AUTHORIZATION INFORMATION Primary Insurance: 1D#: Group #:  Secondary Insurance: 1D#: Group #:  SCHEDULE  INFORMATION: Date: TBD Time: Location: [facility]

## 2024-04-20 NOTE — Telephone Encounter (Signed)
 Called and left vm  Please call pt again  Triage pt for confusion, ha, vision changes, weakness of arms or legs, cp, sob.  Any of the symptoms would warrant calling 911 or presenting to the emergency room.  History of suspect that transient ischemic attack, stroke.  He is to be very careful and return to emergency room for any concern for stroke.   Does he have congestion, sinus pressure,  nausea, room spinning?  He may be suffering from vertigo.  I can place a referral for vestibular rehab.  But first I would like to reevaluate him.  Please schedule appointment with me.  Let me know what schedule

## 2024-04-20 NOTE — Telephone Encounter (Signed)
 close

## 2024-04-24 ENCOUNTER — Other Ambulatory Visit: Payer: Self-pay

## 2024-04-24 ENCOUNTER — Other Ambulatory Visit: Payer: Self-pay | Admitting: Family

## 2024-04-24 DIAGNOSIS — Z8673 Personal history of transient ischemic attack (TIA), and cerebral infarction without residual deficits: Secondary | ICD-10-CM

## 2024-04-26 ENCOUNTER — Other Ambulatory Visit: Payer: Self-pay | Admitting: Cardiovascular Disease

## 2024-04-26 ENCOUNTER — Other Ambulatory Visit: Payer: Self-pay | Admitting: Family

## 2024-04-26 ENCOUNTER — Ambulatory Visit: Admitting: Family

## 2024-04-26 ENCOUNTER — Ambulatory Visit: Payer: Self-pay | Admitting: Family

## 2024-04-26 ENCOUNTER — Encounter: Payer: Self-pay | Admitting: Family

## 2024-04-26 ENCOUNTER — Other Ambulatory Visit: Payer: Self-pay

## 2024-04-26 VITALS — BP 130/70 | HR 76 | Temp 98.0°F | Ht 72.0 in | Wt 207.2 lb

## 2024-04-26 DIAGNOSIS — R748 Abnormal levels of other serum enzymes: Secondary | ICD-10-CM | POA: Diagnosis not present

## 2024-04-26 DIAGNOSIS — Z794 Long term (current) use of insulin: Secondary | ICD-10-CM | POA: Diagnosis not present

## 2024-04-26 DIAGNOSIS — R42 Dizziness and giddiness: Secondary | ICD-10-CM | POA: Diagnosis not present

## 2024-04-26 DIAGNOSIS — Z8673 Personal history of transient ischemic attack (TIA), and cerebral infarction without residual deficits: Secondary | ICD-10-CM

## 2024-04-26 DIAGNOSIS — E1165 Type 2 diabetes mellitus with hyperglycemia: Secondary | ICD-10-CM | POA: Diagnosis not present

## 2024-04-26 MED ORDER — INSULIN DEGLUDEC 100 UNIT/ML ~~LOC~~ SOPN
35.0000 [IU] | PEN_INJECTOR | Freq: Every day | SUBCUTANEOUS | 0 refills | Status: DC
  Filled 2024-04-26: qty 15, 42d supply, fill #0
  Filled 2024-05-03: qty 12, 30d supply, fill #0

## 2024-04-26 NOTE — Patient Instructions (Signed)
 Goal of fasting blood sugar is between 70-120. If in this range, we are reaching our target a1c ( goal 6.5%)   Please check fasting blood sugar in the morning time once or twice a week.  You may also check if you feel like you are having a low episode or particularly high episode of blood sugar.  If blood sugars increase, I may advise you to check blood sugar after your largest meal.  You specifically do this TWO hours after largest meal with the goal of being less than 180.  If blood sugar is checked sooner than 2 hours after largest meal, and it will be  expected to be elevated. You must wait 2 hours.   If your blood sugar is less than 180 hours after your largest meal, again we are reaching our target a1c goal   Call [facility] clinic if: BG < 70 or > 300.   If you have any symptoms of low blood sugar ( sweating, shakiness, lightheaded, dizzy) that you notify me. If you have a low, please drink a glass of orange juice and recheck blood sugar every 5 minutes until you don't feel symptomatic AND blood sugar is above 80.   At this point, I suspect Benign paroxysmal positional vertigo (BPPV) and have included information from [facility] below.   You may look videos for Epley's maneuvers as we discussed online.   If dizziness/vertigo persists, a referral to ENT for further evaluation and medication may be appropriate.   If there is no improvement in your symptoms, or if there is any worsening of symptoms, or if you have any additional concerns, please return to this clinic for re-evaluation; or, if we are closed, consider going to the Emergency Room for evaluation.   What is BPPV?  BPPV  is one of the most common causes of vertigo -- the sudden sensation that you're spinning or that the inside of your head is spinning. Benign paroxysmal positional vertigo causes brief episodes of mild to intense dizziness. Benign paroxysmal positional vertigo is usually triggered by specific changes in  the position of your head. This might occur when you tip your head up or down, when you lie down, or when you turn over or sit up in bed. Although benign paroxysmal positional vertigo can be a bothersome problem, it's rarely serious except when it increases the chance of falls.  If you experience dizziness associated with benign paroxysmal positional vertigo (BPPV), consider these tips: Be aware of the possibility of losing your balance, which can lead to falling and serious injury.  Sit down immediately when you feel dizzy.  Use good lighting if you get up at night.  Walk with a cane for stability if you're at risk of falling.  Work closely with your doctor to manage your symptoms effectively. BPPV may recur even after successful therapy. Fortunately, although there's no cure, the condition can be managed with physical therapy and home treatments.   Dizziness [Uncertain Cause] Dizziness is a common symptom sometimes described as lightheadedness or feeling like you are going to faint. If it lasts for only a few seconds and is related to changes in position (such as getting up after lying or sitting for a long time), it is usually not a sign of anything serious. Dizziness that lasts for minutes to hours, or comes on for no apparent reason, may be a sign of a more serious problem (such as dehydration, a medicine reaction, disease of the heart or brain).  Today's exam did not show an exact cause for your dizzy spell . Sometimes additional tests are required before a cause can be found. Therefore, it is important to follow up with your doctor if your symptoms continue. Home Care: 1) If a dizzy spell occurs and lasts more than a few seconds, lie down until it passes. If you are lying down, then you cannot hurt yourself by falling if you do faint. 2) Do not drive or operate dangerous equipment until the dizzy spells have stopped for at least 48 hours. 3) If dizzy spells occur with sudden standing, this  may be a sign of mild dehydration. Drink extra fluids over the next few days. 4) If you recently started a new medicine or if you had the dose of a current medicine increased (especially blood pressure medicine), talk with the prescribing doctor about your symptoms. Dose adjustments may be needed. Follow Up with your doctor for further evaluation within the next seven days, if your symptoms continue. Get Prompt Medical Attention if any of the following occur: -- Worsening of your symptoms -- Fainting, headache or seizure -- Repeated vomiting -- Feeling like you or the room is spinning -- Chest, arm, neck, back or jaw pain -- Palpitations (the sense that your heart is fluttering or beating fast or hard) -- Shortness of breath -- Blood in vomit or stool (black or red color) -- Weakness of an arm or leg or one side of the face -- Difficulty with speech or vision  2000-2015 [employer] [address] [place], [place] [address]. All rights reserved. This information is not intended as a substitute for professional medical care. Always follow your healthcare professional's instructions.

## 2024-04-26 NOTE — Telephone Encounter (Signed)
 Copied from CRM #[id number]. Topic: Clinical - Medication Refill >> Apr 26, 2024  1:03 PM Burnard DEL wrote: Medication: insulin  degludec (TRESIBA ) 100 UNIT/ML FlexTouch Pen  Has the patient contacted their pharmacy? Yes (Agent: If no, request that the patient contact the pharmacy for the refill. If patient does not wish to contact the pharmacy document the reason why and proceed with request.) (Agent: If yes, when and what did the pharmacy advise?)  This is the patient's preferred pharmacy:  [facility] [address] [place] [address] Phone: [phone] Fax: [phone]   Is this the correct pharmacy for this prescription? Yes If no, delete pharmacy and type the correct one.   Has the prescription been filled recently? No  Is the patient out of the medication? Yes  Has the patient been seen for an appointment in the last year OR does the patient have an upcoming appointment? Yes  Can we respond through MyChart? Yes  Agent: Please be advised that Rx refills may take up to 3 business days. We ask that you follow-up with your pharmacy.  **Patient stated that at his appointment today ,provider increased insulin  to 40 units a day at night time**

## 2024-04-26 NOTE — Progress Notes (Unsigned)
 Assessment & Plan:  Uncontrolled type 2 diabetes mellitus with hyperglycemia, with long-term current use of insulin  (HCC) Assessment & Plan: Severely uncontrolled.  Continue Lantus  to 40 units daily, continue all Humalog  30 units 3 times daily with meals.  No blood glucose data to share at this time.  Strongly reiterated the importance of either using glucometer or continuous glucose meter so we can further adjust Lantus .  Continue keto diet. Discussed severe proteinuria and escalation of A1c.  Jointly agreed to repeat urine micro at follow-up.  If is not improving with improved glycemic control, we will refer to nephrology  Orders: -     Microalbumin / creatinine urine ratio; Future  Elevated alkaline phosphatase level -     Gamma GT; Future -     Alkaline phosphatase, isoenzymes; Future  Vertigo Assessment & Plan: Sensation has resolved.  Presentation consistent with vertigo.  Discussed reassuring vascular imaging including MRI head, MRI brain.  Discussed vestibular rehab.  He politely declines at this time.  He prefers to read about symptom.  Discussed symptoms of CVA and vertigo and how symptoms can overlap.  Due to his history, advised strict return precautions and if symptom were to recur, he would lead stat neuroimaging again.  He verbalized understanding      Return precautions given.   Risks, benefits, and alternatives of the medications and treatment plan prescribed today were discussed, and patient expressed understanding.   Education regarding symptom management and diagnosis given to patient on AVS either electronically or printed.  No follow-ups on file.  [name], FNP  Subjective:    Patient ID: Jose Merritt, male    DOB: [birth year], 48 y.o.   MRN: [id number]  CC: Jose Merritt is a 48 y.o. male who presents today for follow up.   HPI: HPI Follow-up diabetes, labs.   Discussed the use of AI scribe software for clinical note  transcription with the patient, who gave verbal consent to proceed.  History of Present Illness   Jose Merritt is a 48 year old male with a history of TIA and stroke who presents with vertigo and concerns about blood sugar management.  He 1 episode of vertigo 7 days ago vertigo, described as a sensation of falling even when sitting, triggered by looking left, right, or down. Vertigo was associated with a pulsatile sensation but no hearing loss. He also has ringing in both ears, first noticed yesterday. These symptoms began after his last hospitalization 02/23/24.   02/23/24  MRI brain and MRA head, showed no occlusions or stenosis.  He has not used his continuous glucose monitor for over a year but recently started a keto diet and plans to resume using the G7 sensor.  Increase tresiba  to 40 units daily, continue on Humalog  30 units 3 times daily with meals.   He has not checked his blood sugar  Endorses polydipsia Denies blurry vision    Allergies: Metrizamide, Other, Statins, Sulfa antibiotics, Sulfamethoxazole-trimethoprim, Sulfasalazine, Cephalosporins, Contrast media [iodinated contrast media], and Shrimp (diagnostic) Current Outpatient Medications on File Prior to Visit  Medication Sig Dispense Refill   atorvastatin  (LIPITOR) 80 MG tablet Take 1 tablet (80 mg total) by mouth daily. 90 tablet 3   clopidogrel  (PLAVIX ) 75 MG tablet Take 1 tablet (75 mg total) by mouth daily. 90 tablet 3   Continuous Glucose Sensor (DEXCOM G7 SENSOR) MISC Use as directed every 10 days 3 each 0   ezetimibe  (ZETIA ) 10 MG tablet Take 1 tablet (10  mg total) by mouth once daily. Pt must schedule an overdue followup appt with Cardiology for anymore refills. [phone]. Thank you 1st attempt 30 tablet 0   furosemide  (LASIX ) 20 MG tablet Take 1 tablet (20 mg total) by mouth daily. 30 tablet 0   gabapentin  (NEURONTIN ) 600 MG tablet Take 3 tablets (1,800 mg total) by mouth at bedtime. 270 tablet 3    insulin  lispro (HUMALOG ) 100 UNIT/ML KwikPen Inject 30 Units into the skin in the morning, at noon, and at bedtime. 15 mL 11   Insulin  Pen Needle (INSUPEN PEN NEEDLES) 32G X 4 MM MISC use with Tresiba  100 each 0   Insulin  Pen Needle (UNIFINE PENTIPS) 31G X 5 MM MISC Use as directed with Humalog  3 times a day 100 each 0   metoprolol  succinate (TOPROL -XL) 100 MG 24 hr tablet Take 1 tablet (100 mg total) by mouth nightly. 30 tablet 0   rivaroxaban  (XARELTO ) 2.5 MG TABS tablet Take 1 tablet (2.5 mg total) by mouth 2 (two) times daily. 180 tablet 3   sacubitril -valsartan  (ENTRESTO ) 24-26 MG Take 1 tablet by mouth 2 (two) times daily. 60 tablet 0   sertraline  (ZOLOFT ) 50 MG tablet Take 1 tablet (50 mg total) by mouth at bedtime. 90 tablet 3   spironolactone  (ALDACTONE ) 25 MG tablet Take 0.5 tablets (12.5 mg total) by mouth every morning. 15 tablet 0   nitroGLYCERIN  (NITROSTAT ) 0.3 MG SL tablet Place 1 tablet (0.3 mg total) under the tongue every 5 (five) minutes as needed. (Patient not taking: Reported on 04/26/2024) 25 tablet 3   No current facility-administered medications on file prior to visit.    Review of Systems  Constitutional:  Negative for chills and fever.  HENT:  Positive for tinnitus. Negative for congestion, ear pain, hearing loss, rhinorrhea, sinus pressure and sore throat.   Eyes:  Negative for visual disturbance.  Respiratory:  Negative for cough, shortness of breath and wheezing.   Cardiovascular:  Negative for chest pain and palpitations.  Gastrointestinal:  Negative for diarrhea, nausea and vomiting.  Endocrine: Positive for polydipsia.  Genitourinary:  Negative for dysuria.  Musculoskeletal:  Negative for myalgias.  Skin:  Negative for rash.  Neurological:  Negative for headaches.  Hematological:  Negative for adenopathy.      Objective:    BP 130/70   Pulse 76   Temp 98 F (36.7 C) (Oral)   Ht 6' (1.829 m)   Wt 207 lb 3.2 oz (94 kg)   SpO2 98%   BMI 28.10 kg/m   BP Readings from Last 3 Encounters:  04/26/24 130/70  04/18/24 138/80  03/02/24 134/76   Wt Readings from Last 3 Encounters:  04/26/24 207 lb 3.2 oz (94 kg)  04/18/24 211 lb 6.4 oz (95.9 kg)  03/02/24 206 lb 12.8 oz (93.8 kg)    Physical Exam Vitals reviewed.  Constitutional:      Appearance: He is well-developed.  Cardiovascular:     Rate and Rhythm: Regular rhythm.     Heart sounds: Normal heart sounds.  Pulmonary:     Effort: Pulmonary effort is normal. No respiratory distress.     Breath sounds: Normal breath sounds. No wheezing, rhonchi or rales.  Skin:    General: Skin is warm and dry.  Neurological:     Mental Status: He is alert.  Psychiatric:        Speech: Speech normal.        Behavior: Behavior normal.

## 2024-04-27 DIAGNOSIS — R42 Dizziness and giddiness: Secondary | ICD-10-CM | POA: Insufficient documentation

## 2024-04-27 NOTE — Assessment & Plan Note (Addendum)
 Severely uncontrolled.  Continue Lantus  to 40 units daily, continue all Humalog  30 units 3 times daily with meals.  No blood glucose data to share at this time.  Strongly reiterated the importance of either using glucometer or continuous glucose meter so we can further adjust Lantus .  Continue keto diet. Discussed severe proteinuria and escalation of A1c.  Jointly agreed to repeat urine micro at follow-up.  If is not improving with improved glycemic control, we will refer to nephrology

## 2024-04-27 NOTE — Assessment & Plan Note (Addendum)
 Sensation has resolved.  Presentation consistent with vertigo.  Discussed reassuring vascular imaging including MRI head, MRI brain.  Discussed vestibular rehab.  He politely declines at this time.  He prefers to read about symptom.  Discussed symptoms of CVA and vertigo and how symptoms can overlap.  Due to his history, advised strict return precautions and if symptom were to recur, he would lead stat neuroimaging again.  He verbalized understanding

## 2024-04-28 ENCOUNTER — Other Ambulatory Visit: Payer: Self-pay

## 2024-04-29 ENCOUNTER — Other Ambulatory Visit: Payer: Self-pay

## 2024-05-01 ENCOUNTER — Other Ambulatory Visit: Payer: Self-pay

## 2024-05-01 MED ORDER — RIVAROXABAN 2.5 MG PO TABS
2.5000 mg | ORAL_TABLET | Freq: Two times a day (BID) | ORAL | 0 refills | Status: AC
  Filled 2024-05-01: qty 60, 30d supply, fill #0

## 2024-05-01 NOTE — Telephone Encounter (Signed)
 Prescription refill request for Xarelto  received.  Indication: Last office visit: 01/29/2023 Weight: 94 Age: 48 Scr: 0.92 (04/19/24) CrCl: 128  Appropriate dose for CAD,  Will send in 30 days, PT to make follow up appt

## 2024-05-03 ENCOUNTER — Other Ambulatory Visit: Payer: Self-pay

## 2024-05-04 ENCOUNTER — Ambulatory Visit: Admitting: Family

## 2024-05-13 ENCOUNTER — Other Ambulatory Visit: Payer: Self-pay

## 2024-05-24 ENCOUNTER — Other Ambulatory Visit

## 2024-05-29 ENCOUNTER — Ambulatory Visit: Admitting: Family

## 2024-06-07 ENCOUNTER — Encounter: Payer: Self-pay | Admitting: Family

## 2024-06-08 ENCOUNTER — Other Ambulatory Visit: Payer: Self-pay

## 2024-06-08 MED ORDER — INSULIN DEGLUDEC 100 UNIT/ML ~~LOC~~ SOPN
35.0000 [IU] | PEN_INJECTOR | Freq: Every day | SUBCUTANEOUS | 0 refills | Status: AC
  Filled 2024-06-08: qty 15, 42d supply, fill #0

## 2024-06-08 MED ORDER — INSUPEN PEN NEEDLES 32G X 4 MM MISC
0 refills | Status: AC
  Filled 2024-06-08: qty 100, 25d supply, fill #0

## 2024-06-08 MED ORDER — DEXCOM G7 SENSOR MISC
0 refills | Status: AC
  Filled 2024-06-08: qty 3, 30d supply, fill #0

## 2024-06-09 NOTE — Telephone Encounter (Signed)
 Spoke to pt he is not connected to our office to print off data, he will call back to schedule appt can be virtual or in person

## 2024-06-13 ENCOUNTER — Other Ambulatory Visit: Payer: Self-pay

## 2024-06-27 ENCOUNTER — Encounter: Payer: Self-pay | Admitting: Family

## 2024-07-12 ENCOUNTER — Ambulatory Visit: Admitting: Family
